# Patient Record
Sex: Female | Born: 1935 | Race: White | Hispanic: No | Smoking: Never smoker
Health system: Southern US, Community
[De-identification: ages and names within clinical notes are randomized; demographics above are authoritative.]

## PROBLEM LIST (undated history)

## (undated) DIAGNOSIS — I4891 Unspecified atrial fibrillation: Secondary | ICD-10-CM

## (undated) DIAGNOSIS — K5792 Diverticulitis of intestine, part unspecified, without perforation or abscess without bleeding: Secondary | ICD-10-CM

## (undated) DIAGNOSIS — F329 Major depressive disorder, single episode, unspecified: Secondary | ICD-10-CM

## (undated) DIAGNOSIS — K219 Gastro-esophageal reflux disease without esophagitis: Secondary | ICD-10-CM

## (undated) DIAGNOSIS — E079 Disorder of thyroid, unspecified: Secondary | ICD-10-CM

## (undated) DIAGNOSIS — F32A Depression, unspecified: Secondary | ICD-10-CM

## (undated) HISTORY — PX: JOINT REPLACEMENT: SHX530

---

## 1998-10-21 ENCOUNTER — Inpatient Hospital Stay (HOSPITAL_COMMUNITY): Admission: EM | Admit: 1998-10-21 | Discharge: 1998-10-23 | Payer: Self-pay | Admitting: Emergency Medicine

## 1998-10-21 ENCOUNTER — Encounter: Payer: Self-pay | Admitting: Cardiovascular Disease

## 1998-10-22 ENCOUNTER — Encounter: Payer: Self-pay | Admitting: Cardiology

## 1998-11-08 ENCOUNTER — Encounter: Payer: Self-pay | Admitting: Gastroenterology

## 1998-11-08 ENCOUNTER — Ambulatory Visit (HOSPITAL_COMMUNITY): Admission: RE | Admit: 1998-11-08 | Discharge: 1998-11-08 | Payer: Self-pay | Admitting: Gastroenterology

## 2000-01-25 ENCOUNTER — Emergency Department (HOSPITAL_COMMUNITY): Admission: EM | Admit: 2000-01-25 | Discharge: 2000-01-25 | Payer: Self-pay | Admitting: Emergency Medicine

## 2000-02-20 ENCOUNTER — Other Ambulatory Visit: Admission: RE | Admit: 2000-02-20 | Discharge: 2000-02-20 | Payer: Self-pay | Admitting: Gastroenterology

## 2000-02-20 ENCOUNTER — Encounter (INDEPENDENT_AMBULATORY_CARE_PROVIDER_SITE_OTHER): Payer: Self-pay | Admitting: Specialist

## 2000-10-08 ENCOUNTER — Emergency Department (HOSPITAL_COMMUNITY): Admission: EM | Admit: 2000-10-08 | Discharge: 2000-10-09 | Payer: Self-pay | Admitting: Emergency Medicine

## 2001-02-13 ENCOUNTER — Ambulatory Visit (HOSPITAL_COMMUNITY): Admission: RE | Admit: 2001-02-13 | Discharge: 2001-02-13 | Payer: Self-pay | Admitting: Gastroenterology

## 2001-02-13 ENCOUNTER — Encounter: Payer: Self-pay | Admitting: Gastroenterology

## 2009-07-21 ENCOUNTER — Encounter: Admission: RE | Admit: 2009-07-21 | Discharge: 2009-07-21 | Payer: Self-pay | Admitting: Orthopedic Surgery

## 2009-07-29 ENCOUNTER — Ambulatory Visit (HOSPITAL_BASED_OUTPATIENT_CLINIC_OR_DEPARTMENT_OTHER): Admission: RE | Admit: 2009-07-29 | Discharge: 2009-07-29 | Payer: Self-pay | Admitting: Orthopedic Surgery

## 2009-12-20 ENCOUNTER — Ambulatory Visit: Payer: Self-pay | Admitting: Radiology

## 2009-12-20 ENCOUNTER — Emergency Department (HOSPITAL_BASED_OUTPATIENT_CLINIC_OR_DEPARTMENT_OTHER): Admission: EM | Admit: 2009-12-20 | Discharge: 2009-12-20 | Payer: Self-pay | Admitting: Emergency Medicine

## 2010-08-13 LAB — DIFFERENTIAL
Eosinophils Absolute: 0.2 10*3/uL (ref 0.0–0.7)
Eosinophils Relative: 3 % (ref 0–5)
Lymphocytes Relative: 32 % (ref 12–46)
Lymphs Abs: 2.5 10*3/uL (ref 0.7–4.0)
Monocytes Absolute: 0.5 10*3/uL (ref 0.1–1.0)
Monocytes Relative: 6 % (ref 3–12)

## 2010-08-13 LAB — POCT CARDIAC MARKERS
CKMB, poc: 1.4 ng/mL (ref 1.0–8.0)
Myoglobin, poc: 66 ng/mL (ref 12–200)
Troponin i, poc: <0.05 ng/mL (ref 0.00–0.09)

## 2010-08-13 LAB — CBC
Hemoglobin: 13.1 g/dL (ref 12.0–15.0)
MCHC: 33.2 g/dL (ref 30.0–36.0)
Platelets: 195 10*3/uL (ref 150–400)

## 2010-08-13 LAB — COMPREHENSIVE METABOLIC PANEL
ALT: 23 U/L (ref 0–35)
AST: 29 U/L (ref 0–37)
Albumin: 3.9 g/dL (ref 3.5–5.2)
CO2: 26 mEq/L (ref 19–32)
Calcium: 9.5 mg/dL (ref 8.4–10.5)
GFR calc Af Amer: >60 mL/min (ref >60)
GFR calc non Af Amer: >60 mL/min (ref >60)
Sodium: 144 mEq/L (ref 135–145)
Total Protein: 7.1 g/dL (ref 6.0–8.3)

## 2010-08-26 ENCOUNTER — Encounter (HOSPITAL_COMMUNITY)
Admission: RE | Admit: 2010-08-26 | Discharge: 2010-08-26 | Disposition: A | Discharge status: Home / Self Care | Payer: Medicare Other | Source: Ambulatory Visit | Attending: Orthopedic Surgery | Admitting: Orthopedic Surgery

## 2010-08-26 ENCOUNTER — Other Ambulatory Visit: Payer: Self-pay | Admitting: Orthopedic Surgery

## 2010-08-26 ENCOUNTER — Encounter (INDEPENDENT_AMBULATORY_CARE_PROVIDER_SITE_OTHER): Payer: Medicare Other | Admitting: *Deleted

## 2010-08-26 DIAGNOSIS — M79609 Pain in unspecified limb: Secondary | ICD-10-CM

## 2010-08-26 DIAGNOSIS — R609 Edema, unspecified: Secondary | ICD-10-CM

## 2010-08-26 LAB — COMPREHENSIVE METABOLIC PANEL
ALT: 26 U/L (ref 0–35)
AST: 28 U/L (ref 0–37)
CO2: 26 mEq/L (ref 19–32)
Calcium: 9.4 mg/dL (ref 8.4–10.5)
Chloride: 105 mEq/L (ref 96–112)
GFR calc Af Amer: >60 mL/min (ref >60)
GFR calc non Af Amer: >60 mL/min (ref >60)
Glucose, Bld: 99 mg/dL (ref 70–99)
Sodium: 139 mEq/L (ref 135–145)
Total Bilirubin: 0.6 mg/dL (ref 0.3–1.2)

## 2010-08-26 LAB — URINALYSIS, ROUTINE W REFLEX MICROSCOPIC
Glucose, UA: NEGATIVE mg/dL
Hgb urine dipstick: NEGATIVE
Ketones, ur: NEGATIVE mg/dL
Protein, ur: NEGATIVE mg/dL
Urobilinogen, UA: 0.2 mg/dL (ref 0.0–1.0)

## 2010-08-26 LAB — CBC
Hemoglobin: 13.2 g/dL (ref 12.0–15.0)
Platelets: 266 10*3/uL (ref 150–400)
RBC: 4.32 MIL/uL (ref 3.87–5.11)

## 2010-08-26 LAB — SURGICAL PCR SCREEN: Staphylococcus aureus: NEGATIVE

## 2010-08-26 LAB — URINE MICROSCOPIC-ADD ON

## 2010-08-26 LAB — PROTIME-INR
INR: 0.91 (ref 0.00–1.49)
Prothrombin Time: 12.5 seconds (ref 11.6–15.2)

## 2010-08-31 ENCOUNTER — Inpatient Hospital Stay (HOSPITAL_COMMUNITY): Payer: Medicare Other

## 2010-08-31 ENCOUNTER — Inpatient Hospital Stay (HOSPITAL_COMMUNITY)
Admission: RE | Admit: 2010-08-31 | Discharge: 2010-09-03 | DRG: 470 | Disposition: A | Discharge status: Skilled Nursing Facility | Payer: Medicare Other | Source: Ambulatory Visit | Attending: Orthopedic Surgery | Admitting: Orthopedic Surgery

## 2010-08-31 DIAGNOSIS — F329 Major depressive disorder, single episode, unspecified: Secondary | ICD-10-CM | POA: Diagnosis present

## 2010-08-31 DIAGNOSIS — Z01810 Encounter for preprocedural cardiovascular examination: Secondary | ICD-10-CM

## 2010-08-31 DIAGNOSIS — M1710 Unilateral primary osteoarthritis, unspecified knee: Principal | ICD-10-CM | POA: Diagnosis present

## 2010-08-31 DIAGNOSIS — F3289 Other specified depressive episodes: Secondary | ICD-10-CM | POA: Diagnosis present

## 2010-08-31 DIAGNOSIS — Z01812 Encounter for preprocedural laboratory examination: Secondary | ICD-10-CM

## 2010-08-31 DIAGNOSIS — IMO0002 Reserved for concepts with insufficient information to code with codable children: Principal | ICD-10-CM | POA: Diagnosis present

## 2010-08-31 DIAGNOSIS — K5732 Diverticulitis of large intestine without perforation or abscess without bleeding: Secondary | ICD-10-CM | POA: Diagnosis present

## 2010-08-31 DIAGNOSIS — K219 Gastro-esophageal reflux disease without esophagitis: Secondary | ICD-10-CM | POA: Diagnosis present

## 2010-09-01 ENCOUNTER — Encounter: Payer: Self-pay | Admitting: Orthopedic Surgery

## 2010-09-01 LAB — CBC
HCT: 28.2 % — ABNORMAL LOW (ref 36.0–46.0)
Hemoglobin: 9 g/dL — ABNORMAL LOW (ref 12.0–15.0)
MCH: 29.8 pg (ref 26.0–34.0)
MCV: 93.4 fL (ref 78.0–100.0)
RBC: 3.02 MIL/uL — ABNORMAL LOW (ref 3.87–5.11)
WBC: 7.8 10*3/uL (ref 4.0–10.5)

## 2010-09-01 LAB — HEMOGLOBIN AND HEMATOCRIT, BLOOD: HCT: 28.9 % — ABNORMAL LOW (ref 36.0–46.0)

## 2010-09-01 LAB — BASIC METABOLIC PANEL
CO2: 26 mEq/L (ref 19–32)
Calcium: 7.8 mg/dL — ABNORMAL LOW (ref 8.4–10.5)
Chloride: 105 mEq/L (ref 96–112)
GFR calc Af Amer: >60 mL/min (ref >60)
Glucose, Bld: 128 mg/dL — ABNORMAL HIGH (ref 70–99)
Potassium: 3.9 mEq/L (ref 3.5–5.1)
Sodium: 137 mEq/L (ref 135–145)

## 2010-09-01 NOTE — Op Note (Signed)
NAMESALAH, BURLISON NO.:  [id number]  MEDICAL RECORD NO.:  [id number]           PATIENT TYPE:  I  LOCATION:  5017                         FACILITY:  [facility]  PHYSICIAN:  Loreta Ave, M.D. DATE OF BIRTH:  Feb 19, 1926  DATE OF PROCEDURE:  08/31/2010 DATE OF DISCHARGE:                              OPERATIVE REPORT   PREOPERATIVE DIAGNOSES: 1. End-stage degenerative arthritis, left knee, varus alignment, mild     flexion contracture. 2. Approaching end-stage degenerative arthritis, right knee.  POSTOPERATIVE DIAGNOSES: 1. End-stage degenerative arthritis, left knee, varus alignment, mild     flexion contracture. 2. Approaching end-stage degenerative arthritis, right knee.  PROCEDURES: 1. Left knee modified minimally invasive total knee replacement     Stryker triathlon prosthesis.  Soft tissue balancing.  Cemented     pegged posterior stabilized #4 femoral component.  Cemented #4     tibial component, 9-mm polyethylene insert.  Cemented resurfacing     32-mm patellar component.  Soft tissue balancing. 2. Intraarticular injection, right knee with Depo-Medrol and Marcaine.  SURGEON:  Loreta Ave, MD  ASSISTANT:  Genene Churn. Barry Dienes, [place], presented throughout the entire case and necessary for timely completion of procedure.  ANESTHESIA:  General.  BLOOD LOSS:  Minimal.  SPECIMENS:  None.  CULTURES:  None.  COMPLICATIONS:  None.  DRESSING:  Soft compressive on the left with knee immobilizer.  DRAIN:  Hemovac x1 on the left.  TOURNIQUET TIME:  One hour on the left.  PROCEDURE:  The patient was brought to the operating room and placed on the operating table in supine position.  After adequate anesthesia had been obtained, both knees were examined.  On the right, she had full extension, some varus flexion better than 90 degrees.  Under sterile technique, I injected intra-articularly with Depo-Medrol and Marcaine, 80 mg Depo-Medrol and 5 mL of  0.25% Marcaine.  Attention was turned to the left.  Knee was examined.  Mild flexion contracture, varus alignment, partially correctable flexion 90 degrees.  Tourniquet was applied.  Prepped and draped in the usual sterile fashion. Exsanguinated with elevation of Esmarch, tourniquet was deflated to 350 mmHg.  Straight incision above the patella down to tibial tubercle. Medial arthrotomy, vastus splitting, preserving quad tendon.  Knee was exposed.  Grade 4 change throughout.  Moderately osteopenic throughout as well.  Grade 4 changes throughout.  Loose bodies, remnants of menisci, cruciate ligaments, and spurs were removed.  Distal femur was exposed.  Intramedullary guide was placed.  A 10-mm resection, set at 5 degrees of valgus.  Using epicondylar axis, the femur was sized, cut, and fitted for posterior stabilized #4 femoral component.  Attention was turned to the tibia.  Extramedullary guide.  Three-degree posterior slope cut.  Size #4 component.  Medial capsule release had already been done.  Patella was exposed, posterior 10 mm was removed.  Drill was sized and fitted for a 32-mm component.  Trials were put in place throughout.  #4 above and below and a 9-mm insert.  32-mm patella.  With this construct, I had a nicely balanced knee which was stable in flexion and extension.  Good mechanical axis, full motion.  Excellent patellofemoral tracking.  Tibia was marked for rotation and hand reamed. All trials had been removed.  Copious irrigation with a pulse irrigating device.  Cement prepared, placed on all components, firmly seated. Excessive cement was removed.  Polyethylene was attached to the tibia. Knee was reduced.  Patella was held with clamp.  Once the cement hardened, knee was reexamined.  I again pleased with alignment, stability, mechanical axis, and tracking.  Wound was irrigated again. Hemovac was placed.  Brought out through a separate stab wound. Arthrotomy was closed #1  Vicryl, skin and subcutaneous tissue with Vicryl and staples.  Knee was injected with Marcaine, Hemovac clamp. Sterile compressive dressing was applied.  Tourniquet was deflated and removed.  Knee immobilizer was applied.  Anesthesia was reversed. Brought to the recovery room.  Tolerated the surgery well.  No complications.     Loreta Ave, M.D.     DFM/MEDQ  D:  08/31/2010  T:  09/01/2010  Job:  [id number]  Electronically Signed by Mckinley Jewel M.D. on 09/01/2010 02:10:11 PM

## 2010-09-02 LAB — BASIC METABOLIC PANEL WITH GFR
BUN: 6 mg/dL (ref 6–23)
CO2: 25 meq/L (ref 19–32)
Calcium: 8.1 mg/dL — ABNORMAL LOW (ref 8.4–10.5)
Chloride: 105 meq/L (ref 96–112)
Creatinine, Ser: 0.61 mg/dL (ref 0.4–1.2)
GFR calc non Af Amer: >60 mL/min
Glucose, Bld: 122 mg/dL — ABNORMAL HIGH (ref 70–99)
Potassium: 4.2 meq/L (ref 3.5–5.1)
Sodium: 136 meq/L (ref 135–145)

## 2010-09-02 LAB — URINALYSIS, ROUTINE W REFLEX MICROSCOPIC
Bilirubin Urine: NEGATIVE
Glucose, UA: NEGATIVE mg/dL
Hgb urine dipstick: NEGATIVE
Ketones, ur: NEGATIVE mg/dL
Nitrite: NEGATIVE
Protein, ur: NEGATIVE mg/dL
Specific Gravity, Urine: 1.033 — ABNORMAL HIGH (ref 1.005–1.030)
Urobilinogen, UA: 0.2 mg/dL (ref 0.0–1.0)
pH: 5 (ref 5.0–8.0)

## 2010-09-02 LAB — CBC
HCT: 26.7 % — ABNORMAL LOW (ref 36.0–46.0)
Hemoglobin: 8.5 g/dL — ABNORMAL LOW (ref 12.0–15.0)
RBC: 2.85 MIL/uL — ABNORMAL LOW (ref 3.87–5.11)
RDW: 14 % (ref 11.5–15.5)
WBC: 9.4 10*3/uL (ref 4.0–10.5)

## 2010-09-02 LAB — URINE CULTURE: Culture  Setup Time: 201204040713

## 2010-09-02 LAB — PROTIME-INR: INR: 1.39 (ref 0.00–1.49)

## 2010-09-03 LAB — BASIC METABOLIC PANEL
BUN: 5 mg/dL — ABNORMAL LOW (ref 6–23)
Calcium: 8.4 mg/dL (ref 8.4–10.5)
Creatinine, Ser: 0.54 mg/dL (ref 0.4–1.2)
GFR calc non Af Amer: >60 mL/min (ref >60)
Potassium: 3.9 mEq/L (ref 3.5–5.1)

## 2010-09-03 LAB — PROTIME-INR
INR: 2.01 — ABNORMAL HIGH (ref 0.00–1.49)
Prothrombin Time: 22.9 seconds — ABNORMAL HIGH (ref 11.6–15.2)

## 2010-09-03 LAB — CBC
Hemoglobin: 8 g/dL — ABNORMAL LOW (ref 12.0–15.0)
MCHC: 32.4 g/dL (ref 30.0–36.0)
Platelets: 185 10*3/uL (ref 150–400)
RDW: 13.8 % (ref 11.5–15.5)

## 2010-09-16 ENCOUNTER — Encounter (INDEPENDENT_AMBULATORY_CARE_PROVIDER_SITE_OTHER): Payer: Medicare Other | Admitting: *Deleted

## 2010-09-16 ENCOUNTER — Other Ambulatory Visit: Payer: Self-pay | Admitting: Cardiology

## 2010-09-16 DIAGNOSIS — M7989 Other specified soft tissue disorders: Secondary | ICD-10-CM

## 2010-09-16 DIAGNOSIS — M79609 Pain in unspecified limb: Secondary | ICD-10-CM

## 2010-09-19 ENCOUNTER — Encounter: Payer: Self-pay | Admitting: Orthopedic Surgery

## 2010-10-04 NOTE — Discharge Summary (Signed)
[name], Fernandez NO.:  [id number]  MEDICAL RECORD NO.:  [id number]           PATIENT TYPE:  I  LOCATION:  5017                         FACILITY:  [facility]  PHYSICIAN:  Vanessa Fernandez, M.D. DATE OF BIRTH:  01/21/1926  DATE OF ADMISSION:  08/31/2010 DATE OF DISCHARGE:  09/03/2010                              DISCHARGE SUMMARY   FINAL DIAGNOSES: 1. Status post left total knee replacement for end-stage degenerative     joint disease. 2. Right knee intra-articular Marcaine/Depo-Medrol injection for     degenerative joint disease. 3. Gastroesophageal reflux disease. 4. Depression. 5. Diverticulitis.  HISTORY OF PRESENT ILLNESS:  This is an 77 year old white female with history of end-stage DJD, left knee and chronic pain who presented to our office for preop evaluation for total knee replacement.  She had progressively worsening pain with no response with conservative treatment.  Significant decrease in her daily activities due to the ongoing complaint.  The patient also had a known history of right knee DJD and was wanting intra-articular Marcaine/Depo-Medrol injection performed at the time of her surgery.  HOSPITAL COURSE:  On August 31, 2010, the patient was taken to the [facility] OR and a left total knee replacement and right knee intra-articular Marcaine/Depo-Medrol injection procedures were performed.  Surgeon was Mckinley Jewel, MD and assistant was Zonia Kief, PA-C.  Anesthesia was general.  Minimal blood loss.  Tourniquet time, left knee 58 minutes. One Hemovac drain was placed.  There were no surgical or anesthesia complications and the patient was transferred to recovery in stable condition.  September 01, 2010, the patient doing well with good pain control, left knee.  A couple hours ago, the patient was complaining of some lower chest pressure or aching.  RN gave her Maalox and the patient did have some relief with this.  We ordered a EKG which  showed a normal sinus rhythm.  No acute changes.  Temperature 100.2, pulse 74, respirations 18, and blood pressure 103/57.  Dressing clean, dry, intact.  Calf nontender.  Neurovascularly intact.  Skin warm and dry. Case manager to began arranging transfer to [facility] for rehab. The patient states that she does have a bed available.  PT, OT consults. Pharmacy protocol Coumadin and Lovenox are for DVT prophylaxis.  LABORATORY DATA:  WBC 7.8, hemoglobin 9.0, hematocrit 28.2, and platelets 202.  Sodium 137, potassium 3.9, chloride 105, CO2 26, BUN 6, creatinine 0.60, and glucose 128.  INR 1.11.  DISCHARGE MEDICATIONS: 1. Norco 10/325 one to two tablets p.o. q.6-8 h. for pain. 2. Robaxin 500 mg 1 tablet p.o. q.6 h. p.r.n. for spasms. 3. Lovenox 30 mg 1 subcu injection q.12 h. and discontinue when     Coumadin is therapeutic with INR 2-3. 4. Coumadin pharmacy protocol.  Maintain INR 2-3 x4 weeks postop for     DVT prophylaxis. 5. Florastor 1 capsule p.o. daily. 6. Carafate CTs 2 teaspoons p.o. daily as needed. 7. Latanoprost 0.005% ophthalmic 1 drop in both eyes daily at bedtime. 8. Clorazepate 7.5 mg 1 tablet p.o. at bedtime. 9. Sertraline 50 mg 1 tablet p.o. daily. 10.Nexium 40 mg 1  tablet p.o. at bedtime.  CONDITION:  Good and stable.  DISPOSITION:  Transfer to a skilled nursing facility for rehab.  INSTRUCTIONS:  While at the facility, the patient will continue to work with PT and OT to improve ambulation and knee range of motion and strengthening.  Weightbear as tolerated.  CPM 0-70 degrees 6-8 hours daily and increase by 10 degrees each day.  Coumadin x4 weeks postop DVT prophylaxis.  Stop Lovenox when Coumadin is therapeutic with INR 2-3. Daily dressing changes with a 4 x 4 gauze and then apply [facility] over this.  Continue to ice and elevate as needed.  The patient needs return office visit with Dr. Eulah Pont when she is 2 weeks postop for recheck.  We will remove staples at  that time.  Please notify us immediately if there are any other questions or concerns at [phone].     Vanessa Fernandez. Vanessa Fernandez.   ______________________________ Vanessa Fernandez, M.D.    JMO/MEDQ  D:  09/01/2010  T:  09/02/2010  Job:  [id number]  Electronically Signed by Zonia Kief P.A. on 09/14/2010 03:21:50 PM Electronically Signed by Mckinley Jewel M.D. on 10/04/2010 01:29:33 PM

## 2011-03-21 ENCOUNTER — Encounter (HOSPITAL_COMMUNITY)
Admission: RE | Admit: 2011-03-21 | Discharge: 2011-03-21 | Disposition: A | Discharge status: Home / Self Care | Payer: Medicare Other | Source: Ambulatory Visit | Attending: Orthopedic Surgery | Admitting: Orthopedic Surgery

## 2011-03-21 LAB — COMPREHENSIVE METABOLIC PANEL
ALT: 12 U/L (ref 0–35)
AST: 18 U/L (ref 0–37)
Albumin: 3.8 g/dL (ref 3.5–5.2)
CO2: 28 mEq/L (ref 19–32)
Calcium: 9.9 mg/dL (ref 8.4–10.5)
Chloride: 104 mEq/L (ref 96–112)
GFR calc non Af Amer: 79 mL/min — ABNORMAL LOW (ref >90)
Sodium: 141 mEq/L (ref 135–145)

## 2011-03-21 LAB — CBC
Platelets: 261 10*3/uL (ref 150–400)
RDW: 13.4 % (ref 11.5–15.5)
WBC: 6.9 10*3/uL (ref 4.0–10.5)

## 2011-03-21 LAB — URINE MICROSCOPIC-ADD ON

## 2011-03-21 LAB — TYPE AND SCREEN

## 2011-03-21 LAB — URINALYSIS, ROUTINE W REFLEX MICROSCOPIC
Bilirubin Urine: NEGATIVE
Ketones, ur: NEGATIVE mg/dL
Nitrite: NEGATIVE
Urobilinogen, UA: 0.2 mg/dL (ref 0.0–1.0)

## 2011-03-21 LAB — SURGICAL PCR SCREEN: Staphylococcus aureus: NEGATIVE

## 2011-03-21 LAB — APTT: aPTT: 29 seconds (ref 24–37)

## 2011-03-21 LAB — PROTIME-INR: INR: 0.98 (ref 0.00–1.49)

## 2011-03-29 ENCOUNTER — Ambulatory Visit (HOSPITAL_COMMUNITY)
Admission: RE | Admit: 2011-03-29 | Discharge: 2011-03-29 | Disposition: A | Discharge status: Home / Self Care | Payer: Medicare Other | Source: Ambulatory Visit | Attending: Orthopedic Surgery | Admitting: Orthopedic Surgery

## 2011-03-29 ENCOUNTER — Other Ambulatory Visit (HOSPITAL_COMMUNITY): Payer: Self-pay | Admitting: Orthopedic Surgery

## 2011-03-29 ENCOUNTER — Inpatient Hospital Stay (HOSPITAL_COMMUNITY): Payer: Medicare Other

## 2011-03-29 ENCOUNTER — Inpatient Hospital Stay (HOSPITAL_COMMUNITY)
Admission: RE | Admit: 2011-03-29 | Discharge: 2011-04-01 | DRG: 470 | Disposition: A | Discharge status: Skilled Nursing Facility | Payer: Medicare Other | Source: Ambulatory Visit | Attending: Orthopedic Surgery | Admitting: Orthopedic Surgery

## 2011-03-29 DIAGNOSIS — F329 Major depressive disorder, single episode, unspecified: Secondary | ICD-10-CM | POA: Diagnosis present

## 2011-03-29 DIAGNOSIS — K219 Gastro-esophageal reflux disease without esophagitis: Secondary | ICD-10-CM | POA: Diagnosis present

## 2011-03-29 DIAGNOSIS — K5732 Diverticulitis of large intestine without perforation or abscess without bleeding: Secondary | ICD-10-CM | POA: Diagnosis present

## 2011-03-29 DIAGNOSIS — M1710 Unilateral primary osteoarthritis, unspecified knee: Principal | ICD-10-CM | POA: Diagnosis present

## 2011-03-29 DIAGNOSIS — IMO0002 Reserved for concepts with insufficient information to code with codable children: Principal | ICD-10-CM | POA: Diagnosis present

## 2011-03-29 DIAGNOSIS — M1990 Unspecified osteoarthritis, unspecified site: Secondary | ICD-10-CM

## 2011-03-29 DIAGNOSIS — F3289 Other specified depressive episodes: Secondary | ICD-10-CM | POA: Diagnosis present

## 2011-03-29 DIAGNOSIS — Z87891 Personal history of nicotine dependence: Secondary | ICD-10-CM

## 2011-03-29 LAB — URINALYSIS, ROUTINE W REFLEX MICROSCOPIC
Glucose, UA: NEGATIVE mg/dL
Ketones, ur: NEGATIVE mg/dL
Leukocytes, UA: NEGATIVE
Nitrite: NEGATIVE
Protein, ur: NEGATIVE mg/dL
Urobilinogen, UA: 0.2 mg/dL (ref 0.0–1.0)

## 2011-03-30 LAB — BASIC METABOLIC PANEL
BUN: 8 mg/dL (ref 6–23)
CO2: 24 mEq/L (ref 19–32)
Chloride: 104 mEq/L (ref 96–112)
GFR calc Af Amer: >90 mL/min (ref >90)
Glucose, Bld: 114 mg/dL — ABNORMAL HIGH (ref 70–99)
Potassium: 4.1 mEq/L (ref 3.5–5.1)

## 2011-03-30 LAB — URINE CULTURE
Colony Count: NO GROWTH
Culture  Setup Time: 201210310743

## 2011-03-30 LAB — CBC
HCT: 33.1 % — ABNORMAL LOW (ref 36.0–46.0)
MCHC: 31.7 g/dL (ref 30.0–36.0)
Platelets: 180 10*3/uL (ref 150–400)
RDW: 14.1 % (ref 11.5–15.5)
WBC: 7.5 10*3/uL (ref 4.0–10.5)

## 2011-03-31 LAB — BASIC METABOLIC PANEL
CO2: 28 mEq/L (ref 19–32)
Calcium: 8.5 mg/dL (ref 8.4–10.5)
GFR calc non Af Amer: 82 mL/min — ABNORMAL LOW (ref >90)
Glucose, Bld: 126 mg/dL — ABNORMAL HIGH (ref 70–99)
Potassium: 3.8 mEq/L (ref 3.5–5.1)
Sodium: 138 mEq/L (ref 135–145)

## 2011-03-31 LAB — CBC
MCV: 92.9 fL (ref 78.0–100.0)
Platelets: 185 10*3/uL (ref 150–400)
RDW: 13.9 % (ref 11.5–15.5)
WBC: 8.1 10*3/uL (ref 4.0–10.5)

## 2011-03-31 LAB — GLUCOSE, CAPILLARY: Glucose-Capillary: 122 mg/dL — ABNORMAL HIGH (ref 70–99)

## 2011-03-31 MED ORDER — ACETAMINOPHEN 650 MG RE SUPP: 650.0000 mg | Freq: Four times a day (QID) | RECTAL | Status: DC | PRN

## 2011-04-01 LAB — BASIC METABOLIC PANEL
BUN: 7 mg/dL (ref 6–23)
GFR calc Af Amer: >90 mL/min (ref >90)
GFR calc non Af Amer: 81 mL/min — ABNORMAL LOW (ref >90)
Potassium: 3.6 mEq/L (ref 3.5–5.1)
Sodium: 139 mEq/L (ref 135–145)

## 2011-04-01 LAB — CBC
Platelets: 182 10*3/uL (ref 150–400)
RBC: 2.82 MIL/uL — ABNORMAL LOW (ref 3.87–5.11)
WBC: 5.2 10*3/uL (ref 4.0–10.5)

## 2011-04-01 LAB — PROTIME-INR
INR: 2.56 — ABNORMAL HIGH (ref 0.00–1.49)
Prothrombin Time: 27.9 seconds — ABNORMAL HIGH (ref 11.6–15.2)

## 2011-04-01 MED ORDER — METOCLOPRAMIDE HCL 10 MG PO TABS: 5.0000 mg | ORAL_TABLET | Freq: Three times a day (TID) | ORAL | Status: DC | PRN

## 2011-04-01 MED ORDER — BISACODYL 10 MG RE SUPP
10.0000 mg | Freq: Every day | RECTAL | Status: DC | PRN
Start: 2011-04-01 — End: 2011-04-01

## 2011-04-01 MED ORDER — ENOXAPARIN SODIUM 30 MG/0.3ML ~~LOC~~ SOLN
30.0000 mg | Freq: Two times a day (BID) | SUBCUTANEOUS | Status: DC
  Filled 2011-04-01 (×3): qty 0.3

## 2011-04-01 MED ORDER — ONDANSETRON HCL 4 MG/2ML IJ SOLN: 4.0000 mg | Freq: Four times a day (QID) | INTRAMUSCULAR | Status: DC | PRN

## 2011-04-01 MED ORDER — HYDROCODONE-ACETAMINOPHEN 5-325 MG PO TABS: 1.0000 | ORAL_TABLET | Freq: Four times a day (QID) | ORAL | Status: DC | PRN

## 2011-04-01 MED ORDER — PHENOL 1.4 % MT LIQD: 1.0000 | OROMUCOSAL | Status: DC | PRN

## 2011-04-01 MED ORDER — ONDANSETRON HCL 4 MG PO TABS: 4.0000 mg | ORAL_TABLET | Freq: Four times a day (QID) | ORAL | Status: DC | PRN

## 2011-04-01 MED ORDER — MENTHOL 3 MG MT LOZG: 1.0000 | LOZENGE | OROMUCOSAL | Status: DC | PRN

## 2011-04-01 MED ORDER — ALUM & MAG HYDROXIDE-SIMETH 200-200-20 MG/5ML PO SUSP: 30.0000 mL | ORAL | Status: DC | PRN

## 2011-04-01 MED ORDER — ACETAMINOPHEN 650 MG RE SUPP: 325.0000 mg | RECTAL | Status: DC | PRN

## 2011-04-01 MED ORDER — METHOCARBAMOL 100 MG/ML IJ SOLN
500.0000 mg | Freq: Four times a day (QID) | INTRAVENOUS | Status: DC | PRN
  Filled 2011-04-01: qty 5

## 2011-04-01 MED ORDER — SUCRALFATE 1 GM/10ML PO SUSP
1.0000 g | Freq: Every day | ORAL | Status: DC | PRN
  Filled 2011-04-01: qty 10

## 2011-04-01 MED ORDER — DOCUSATE SODIUM 100 MG PO CAPS: 100.0000 mg | ORAL_CAPSULE | Freq: Two times a day (BID) | ORAL | Status: DC

## 2011-04-01 MED ORDER — ALUMINUM HYDROXIDE GEL 320 MG/5ML PO SUSP
30.0000 mL | Freq: Four times a day (QID) | ORAL | Status: DC | PRN
  Filled 2011-04-01: qty 30

## 2011-04-01 MED ORDER — METOCLOPRAMIDE HCL 5 MG/ML IJ SOLN
5.0000 mg | Freq: Three times a day (TID) | INTRAMUSCULAR | Status: DC | PRN
  Filled 2011-04-01: qty 2

## 2011-04-01 MED ORDER — SACCHAROMYCES BOULARDII 250 MG PO CAPS
250.0000 mg | ORAL_CAPSULE | Freq: Every day | ORAL | Status: DC
  Filled 2011-04-01 (×2): qty 1

## 2011-04-01 MED ORDER — BISACODYL 5 MG PO TBEC: 10.0000 mg | DELAYED_RELEASE_TABLET | Freq: Every day | ORAL | Status: DC | PRN

## 2011-04-01 MED ORDER — SERTRALINE HCL 50 MG PO TABS
50.0000 mg | ORAL_TABLET | Freq: Every day | ORAL | Status: DC
  Filled 2011-04-01 (×2): qty 1

## 2011-04-01 MED ORDER — SENNOSIDES-DOCUSATE SODIUM 8.6-50 MG PO TABS: 1.0000 | ORAL_TABLET | Freq: Every evening | ORAL | Status: DC | PRN

## 2011-04-01 MED ORDER — CLORAZEPATE DIPOTASSIUM 7.5 MG PO TABS: 7.5000 mg | ORAL_TABLET | Freq: Every day | ORAL | Status: DC

## 2011-04-01 MED ORDER — DIPHENHYDRAMINE HCL 25 MG PO CAPS: 25.0000 mg | ORAL_CAPSULE | ORAL | Status: DC | PRN

## 2011-04-01 MED ORDER — FLORA-Q PO CAPS
1.0000 | ORAL_CAPSULE | Freq: Every day | ORAL | Status: DC
  Filled 2011-04-01 (×2): qty 1

## 2011-04-01 MED ORDER — ACETAMINOPHEN 325 MG PO TABS: 325.0000 mg | ORAL_TABLET | ORAL | Status: DC | PRN

## 2011-04-01 MED ORDER — PANTOPRAZOLE SODIUM 40 MG PO TBEC: 80.0000 mg | DELAYED_RELEASE_TABLET | Freq: Every day | ORAL | Status: DC

## 2011-04-01 MED ORDER — LATANOPROST 0.005 % OP SOLN
1.0000 [drp] | Freq: Every day | OPHTHALMIC | Status: DC
  Filled 2011-04-01: qty 2.5

## 2011-04-01 MED ORDER — METHOCARBAMOL 500 MG PO TABS: 500.0000 mg | ORAL_TABLET | Freq: Four times a day (QID) | ORAL | Status: DC | PRN

## 2011-04-01 MED ORDER — SODIUM CHLORIDE 0.9 % IV SOLN
INTRAVENOUS | Status: DC
  Filled 2011-04-01 (×3): qty 1000

## 2011-04-01 MED ORDER — FLEET ENEMA 7-19 GM/118ML RE ENEM: 1.0000 | ENEMA | Freq: Every day | RECTAL | Status: DC | PRN

## 2011-04-04 ENCOUNTER — Emergency Department (HOSPITAL_COMMUNITY)
Admission: EM | Admit: 2011-04-04 | Discharge: 2011-04-04 | Disposition: A | Discharge status: Rehabilitation Facility | Payer: Medicare Other | Attending: Emergency Medicine | Admitting: Emergency Medicine

## 2011-04-04 ENCOUNTER — Emergency Department (HOSPITAL_COMMUNITY): Payer: Medicare Other

## 2011-04-04 ENCOUNTER — Encounter: Payer: Self-pay | Admitting: Emergency Medicine

## 2011-04-04 DIAGNOSIS — K5792 Diverticulitis of intestine, part unspecified, without perforation or abscess without bleeding: Secondary | ICD-10-CM | POA: Insufficient documentation

## 2011-04-04 DIAGNOSIS — M79609 Pain in unspecified limb: Secondary | ICD-10-CM | POA: Insufficient documentation

## 2011-04-04 DIAGNOSIS — K219 Gastro-esophageal reflux disease without esophagitis: Secondary | ICD-10-CM | POA: Insufficient documentation

## 2011-04-04 DIAGNOSIS — R059 Cough, unspecified: Secondary | ICD-10-CM | POA: Insufficient documentation

## 2011-04-04 DIAGNOSIS — R509 Fever, unspecified: Secondary | ICD-10-CM

## 2011-04-04 DIAGNOSIS — R05 Cough: Secondary | ICD-10-CM | POA: Insufficient documentation

## 2011-04-04 DIAGNOSIS — Z79899 Other long term (current) drug therapy: Secondary | ICD-10-CM | POA: Insufficient documentation

## 2011-04-04 DIAGNOSIS — H409 Unspecified glaucoma: Secondary | ICD-10-CM | POA: Insufficient documentation

## 2011-04-04 DIAGNOSIS — F3289 Other specified depressive episodes: Secondary | ICD-10-CM | POA: Insufficient documentation

## 2011-04-04 DIAGNOSIS — F329 Major depressive disorder, single episode, unspecified: Secondary | ICD-10-CM | POA: Insufficient documentation

## 2011-04-04 HISTORY — DX: Gastro-esophageal reflux disease without esophagitis: K21.9

## 2011-04-04 HISTORY — DX: Diverticulitis of intestine, part unspecified, without perforation or abscess without bleeding: K57.92

## 2011-04-04 HISTORY — DX: Depression, unspecified: F32.A

## 2011-04-04 HISTORY — DX: Major depressive disorder, single episode, unspecified: F32.9

## 2011-04-04 LAB — CBC
MCH: 28.6 pg (ref 26.0–34.0)
MCHC: 31.8 g/dL (ref 30.0–36.0)
MCV: 89.9 fL (ref 78.0–100.0)
Platelets: 323 10*3/uL (ref 150–400)

## 2011-04-04 LAB — URINALYSIS, ROUTINE W REFLEX MICROSCOPIC
Bilirubin Urine: NEGATIVE
Nitrite: NEGATIVE
Protein, ur: NEGATIVE mg/dL
Specific Gravity, Urine: 1.021 (ref 1.005–1.030)
Urobilinogen, UA: 0.2 mg/dL (ref 0.0–1.0)

## 2011-04-04 LAB — URINE MICROSCOPIC-ADD ON

## 2011-04-04 LAB — DIFFERENTIAL
Basophils Relative: 0 % (ref 0–1)
Eosinophils Absolute: 0.2 10*3/uL (ref 0.0–0.7)
Eosinophils Relative: 3 % (ref 0–5)
Neutrophils Relative %: 59 % (ref 43–77)

## 2011-04-04 LAB — BASIC METABOLIC PANEL
BUN: 9 mg/dL (ref 6–23)
Calcium: 9.2 mg/dL (ref 8.4–10.5)
GFR calc Af Amer: >90 mL/min (ref >90)
GFR calc non Af Amer: 84 mL/min — ABNORMAL LOW (ref >90)
Glucose, Bld: 106 mg/dL — ABNORMAL HIGH (ref 70–99)
Potassium: 3.5 mEq/L (ref 3.5–5.1)
Sodium: 139 mEq/L (ref 135–145)

## 2011-04-04 LAB — SEDIMENTATION RATE: Sed Rate: 70 mm/hr — ABNORMAL HIGH (ref 0–22)

## 2011-04-04 MED ORDER — HYDROCODONE-ACETAMINOPHEN 5-325 MG PO TABS
2.0000 | ORAL_TABLET | Freq: Once | ORAL | Status: AC
  Administered 2011-04-04: 2 via ORAL
  Filled 2011-04-04: qty 2

## 2011-04-04 NOTE — ED Notes (Signed)
Pt here s/p Total knee replacement last wenesday with c/o new onset fever and increased pain in knee.  Pt alert and oriented x4 and in NAD.  Pt rates pain 10/10.

## 2011-04-04 NOTE — ED Provider Notes (Addendum)
History     CSN: [id number] Arrival date & time: 04/04/2011  3:34 PM   First MD Initiated Contact with Patient 04/04/11 1535      Chief Complaint  Patient presents with  . Fever    (Consider location/radiation/quality/duration/timing/severity/associated sxs/prior treatment) HPI  Past Medical History  Diagnosis Date  . Depression   . Glaucoma   . Diverticulitis   . GERD (gastroesophageal reflux disease)     Past Surgical History  Procedure Date  . Joint replacement     History reviewed. No pertinent family history.  History  Substance Use Topics  . Smoking status: Never Smoker   . Smokeless tobacco: Not on file  . Alcohol Use: No    OB History    Grav Para Term Preterm Abortions TAB SAB Ect Mult Living                  Review of Systems  Allergies  Aspirin; Bextra; Celebrex; Codeine; Ibuprofen; Naproxen; and Sulfa antibiotics  Home Medications   Current Outpatient Rx  Name Route Sig Dispense Refill  . ACETAMINOPHEN 500 MG PO TABS Oral Take 1,000 mg by mouth every 6 (six) hours as needed. For pain     . CLORAZEPATE DIPOTASSIUM 7.5 MG PO TABS Oral Take 7.5 mg by mouth at bedtime as needed. For sleep    . CROMOLYN SODIUM 5.2 MG/ACT NA AERS Nasal Place 1 spray into the nose daily as needed. For congestion     . ESOMEPRAZOLE MAGNESIUM 40 MG PO CPDR Oral Take 40 mg by mouth every evening.      Marland Kitchen LATANOPROST 0.005 % OP SOLN Both Eyes Place 1 drop into both eyes at bedtime.      Carma Leaven M PLUS PO TABS Oral Take 1 tablet by mouth daily.      Marland Kitchen ALIGN PO Oral Take 1 tablet by mouth daily.      Marland Kitchen SACCHAROMYCES BOULARDII 250 MG PO CAPS Oral Take 250 mg by mouth daily.      . SERTRALINE HCL 50 MG PO TABS Oral Take 50 mg by mouth every morning.     . SUCRALFATE 1 GM/10ML PO SUSP Oral Take 1 g by mouth 3 (three) times daily before meals. As needed For acid reflux       BP 121/59  Pulse 73  Temp(Src) 99.8 F (37.7 C) (Oral)  SpO2 93%  Physical Exam  ED Course    Procedures (including critical care time)  Labs Reviewed  CBC - Abnormal; Notable for the following:    RBC 3.18 (*)    Hemoglobin 9.1 (*)    HCT 28.6 (*)    All other components within normal limits  BASIC METABOLIC PANEL - Abnormal; Notable for the following:    Glucose, Bld 106 (*)    GFR calc non Af Amer 84 (*)    All other components within normal limits  URINALYSIS, ROUTINE W REFLEX MICROSCOPIC - Abnormal; Notable for the following:    Hgb urine dipstick MODERATE (*)    All other components within normal limits  SEDIMENTATION RATE - Abnormal; Notable for the following:    Sed Rate 70 (*)    All other components within normal limits  URINE MICROSCOPIC-ADD ON - Abnormal; Notable for the following:    Squamous Epithelial / LPF FEW (*)    All other components within normal limits  DIFFERENTIAL  CULTURE, BLOOD (ROUTINE X 2)  CULTURE, BLOOD (ROUTINE X 2)  URINE CULTURE  C-REACTIVE  PROTEIN   Dg Chest 2 View  04/04/2011  *RADIOLOGY REPORT*  Clinical Data: Cough, fever, recent knee surgery  CHEST - 2 VIEW  Comparison: 03/29/2011  Findings: Chronic interstitial markings.  No focal consolidation. No pleural effusion or pneumothorax.  Cardiomediastinal silhouette is within normal limits.  Mild degenerative changes of the visualized thoracolumbar spine.  IMPRESSION: No evidence of acute cardiopulmonary disease.  Chronic interstitial markings.  Original Report Authenticated By: Charline Bills, M.D.     No diagnosis found.    MDM  I saw and evaluated the patient, reviewed the resident's note and I agree with the findings and plan.  The patient has had fevers for two days, but no other complaints.  She had knee surgery, but the incision appears clean without erythema or drainage and no other source for infection was identified.  The workup was unremarkable, will discharge and give time.  To return for new symptoms.          Geoffery Lyons, MD 04/04/11 1610  Geoffery Lyons,  MD 04/05/11 0130  Geoffery Lyons, MD 04/06/11 [id number]

## 2011-04-04 NOTE — Progress Notes (Signed)
*  PRELIMINARY RESULTS*  Right  lower estremity venous duples has been performed.  No evidence of deep or superficial vein thrombosis noted in the right leg. [name] 04/04/2011, 5:08 PM

## 2011-04-04 NOTE — ED Notes (Signed)
[facility] to be called for transportation.

## 2011-04-04 NOTE — ED Provider Notes (Signed)
History     CSN: [id number] Arrival date & time: 04/04/2011  3:34 PM   First MD Initiated Contact with Patient 04/04/11 1535      Chief Complaint  Patient presents with  . Fever    (Consider location/radiation/quality/duration/timing/severity/associated sxs/prior treatment) Patient is a 77 y.o. female presenting with fever. The history is provided by the patient.  Fever Primary symptoms of the febrile illness include fever and cough. Primary symptoms do not include headaches, shortness of breath, abdominal pain, nausea, vomiting, diarrhea, dysuria, altered mental status or rash. The current episode started 3 to 5 days ago. This is a recurrent problem. The problem has not changed since onset. The fever began 3 to 5 days ago. The fever has been unchanged since its onset. The maximum temperature recorded prior to her arrival was 102 to 102.9 F.  The cough began 3 to 5 days ago. The cough is new. The cough is non-productive.  The onset of the illness is associated with recent antibiotic use and a foreign body (Pt was on antibiotic just before surgery for UTI; had knee replacment done just under a week ago). Risk factors for febrile illness include implanted device (pt had knee replacement done about a week ago and has been less mobile since that time).   Past Medical History  Diagnosis Date  . Depression   . Glaucoma   . Diverticulitis   . GERD (gastroesophageal reflux disease)     Past Surgical History  Procedure Date  . Joint replacement     History reviewed. No pertinent family history.  History  Substance Use Topics  . Smoking status: Never Smoker   . Smokeless tobacco: Not on file  . Alcohol Use: No    OB History    Grav Para Term Preterm Abortions TAB SAB Ect Mult Living                  Review of Systems  Constitutional: Positive for fever.  Respiratory: Positive for cough. Negative for shortness of breath.   Cardiovascular: Negative for chest pain.    Gastrointestinal: Negative for nausea, vomiting, abdominal pain and diarrhea.  Genitourinary: Negative for dysuria.  Skin: Negative for rash.  Neurological: Negative for headaches.  Psychiatric/Behavioral: Negative for altered mental status.  All other systems reviewed and are negative.    Allergies  Aspirin; Bextra; Celebrex; Codeine; Ibuprofen; Naproxen; and Sulfa antibiotics  Home Medications   Current Outpatient Rx  Name Route Sig Dispense Refill  . ACETAMINOPHEN 500 MG PO TABS Oral Take 1,000 mg by mouth every 6 (six) hours as needed. For pain     . CLORAZEPATE DIPOTASSIUM 7.5 MG PO TABS Oral Take 7.5 mg by mouth at bedtime as needed. For sleep    . CROMOLYN SODIUM 5.2 MG/ACT NA AERS Nasal Place 1 spray into the nose daily as needed. For congestion     . ESOMEPRAZOLE MAGNESIUM 40 MG PO CPDR Oral Take 40 mg by mouth every evening.      Marland Kitchen LATANOPROST 0.005 % OP SOLN Both Eyes Place 1 drop into both eyes at bedtime.      Carma Leaven M PLUS PO TABS Oral Take 1 tablet by mouth daily.      Marland Kitchen ALIGN PO Oral Take 1 tablet by mouth daily.      Marland Kitchen SACCHAROMYCES BOULARDII 250 MG PO CAPS Oral Take 250 mg by mouth daily.      . SERTRALINE HCL 50 MG PO TABS Oral Take 50 mg by  mouth every morning.     . SUCRALFATE 1 GM/10ML PO SUSP Oral Take 1 g by mouth 3 (three) times daily before meals. As needed For acid reflux       BP 138/67  Pulse 78  Temp(Src) 97.9 F (36.6 C) (Oral)  Resp 18  SpO2 96%  Physical Exam  Nursing note and vitals reviewed. Constitutional: She is oriented to person, place, and time. She appears well-developed and well-nourished. No distress.  HENT:  Head: Normocephalic and atraumatic.  Eyes: EOM are normal. Pupils are equal, round, and reactive to light.  Neck: Normal range of motion.  Cardiovascular: Normal rate and normal heart sounds.   Pulmonary/Chest: Effort normal and breath sounds normal. No respiratory distress.  Abdominal: Soft. She exhibits no distension.  There is no tenderness.  Musculoskeletal: She exhibits tenderness (right leg).  Neurological: She is alert and oriented to person, place, and time.  Skin: Skin is warm and dry.       ED Course  Procedures (including critical care time)  Labs Reviewed  CBC - Abnormal; Notable for the following:    RBC 3.18 (*)    Hemoglobin 9.1 (*)    HCT 28.6 (*)    All other components within normal limits  BASIC METABOLIC PANEL - Abnormal; Notable for the following:    Glucose, Bld 106 (*)    GFR calc non Af Amer 84 (*)    All other components within normal limits  URINALYSIS, ROUTINE W REFLEX MICROSCOPIC - Abnormal; Notable for the following:    Hgb urine dipstick MODERATE (*)    All other components within normal limits  SEDIMENTATION RATE - Abnormal; Notable for the following:    Sed Rate 70 (*)    All other components within normal limits  C-REACTIVE PROTEIN - Abnormal; Notable for the following:    CRP 11.84 (*)    All other components within normal limits  URINE MICROSCOPIC-ADD ON - Abnormal; Notable for the following:    Squamous Epithelial / LPF FEW (*)    All other components within normal limits  DIFFERENTIAL  CULTURE, BLOOD (ROUTINE X 2)  CULTURE, BLOOD (ROUTINE X 2)  URINE CULTURE   Dg Chest 2 View  04/04/2011  *RADIOLOGY REPORT*  Clinical Data: Cough, fever, recent knee surgery  CHEST - 2 VIEW  Comparison: 03/29/2011  Findings: Chronic interstitial markings.  No focal consolidation. No pleural effusion or pneumothorax.  Cardiomediastinal silhouette is within normal limits.  Mild degenerative changes of the visualized thoracolumbar spine.  IMPRESSION: No evidence of acute cardiopulmonary disease.  Chronic interstitial markings.  Original Report Authenticated By: Charline Bills, M.D.     1. Fever       MDM  Pt seen and examined. Concern for post-op fever after knee replacement; will look for respiratory cause, urine infection, and DVT.   Pt was at ultrasound when lab  came to collect blood. Now pending.   Pt without clear source for fever. No concern about knee infection at this time as incision looks to be healing well. Elevation in CRP/ESR likely secondary to recent surgery. No DVT on ultrasound. No urine infection. No pneumonia. Will advise pt to contact ortho doc in AM to discuss ongoing fever. Pt to return to ED for any worsening of fever or any redness/warmth to right knee. Will advise continued symptomatic care at facility.  Daleen Bo Resident 04/05/11 [phone]

## 2011-04-04 NOTE — ED Notes (Signed)
Pt given  Water; RN with MD to explain plan of care; questions answer.

## 2011-04-04 NOTE — ED Notes (Signed)
Per EMS:  Pt here with c/o fever that started at 0800 this am.  Pt here from [facility].  Pt also c/o knee pain.  Facility reports fever of 101.1.  Pt admitted to [facility] on Saturday after being discharged from here s/p total right knee replacement.  Pt was found feverish this am.  Pt recvd 2- 7 mg/325mg  Vicodin at 1400 today via facility.  Pt recvd 1 gram of tylenol at that time as well.

## 2011-04-04 NOTE — ED Notes (Signed)
[facility] called to let them know she will be returning and review d/c instructions.

## 2011-04-04 NOTE — ED Notes (Signed)
[facility] here at this time.  

## 2011-04-04 NOTE — ED Notes (Signed)
Vital signs stable. 

## 2011-04-05 LAB — C-REACTIVE PROTEIN: CRP: 11.84 mg/dL — ABNORMAL HIGH (ref <0.60)

## 2011-04-05 LAB — URINE CULTURE

## 2011-04-05 NOTE — Discharge Summary (Signed)
NAMESILVA, AAMODT NO.:  [id number]  MEDICAL RECORD NO.:  [id number]  LOCATION:                                 FACILITY:  PHYSICIAN:  Loreta Ave, M.D. DATE OF BIRTH:  [birth year]  DATE OF ADMISSION:  03/29/2011 DATE OF DISCHARGE:  04/01/2011                              DISCHARGE SUMMARY   FINAL DIAGNOSES: 1. Status post right total knee replacement for end-stage degenerative     joint disease. 2. Gastroesophageal reflux disease. 3. Depression. 4. History of diverticulitis.  HISTORY OF PRESENT ILLNESS:  An 77 year old white female with history of end-stage DJD right knee and chronic pain presented to our office for preop evaluation for total knee replacement.  She had progressively worsening pain with no response with conservative treatment. Significant decrease in her daily activities due to the ongoing complaint.  HOSPITAL COURSE:  On March 29, 2011, the patient was taken to the [facility] OR and a right total knee replacement procedure performed.  SURGEON:  Loreta Ave, MD  ASSISTANT:  Genene Churn. Barry Dienes, PA-C  ANESTHESIA:  General with femoral nerve block.  SPECIMENS:  No specimens.  ESTIMATED BLOOD LOSS:  Minimal.  TOURNIQUET TIME:  64 minutes.  DRAINS:  One Hemovac drain placed.  No surgical or anesthesia complications, and the patient was transferred to recovery in stable condition.  After arriving to the orthopedic unit on pharmacy protocol, Coumadin, and Lovenox started for DVT prophylaxis.  Placed in the CPM machine per protocol.  On March 30, 2011, the patient doing well without complaints.  WBC 7.5, hematocrit 33.1, hemoglobin 10.5, platelets 180. Sodium 137, potassium 4.1, chloride 104, CO2 of 24, BUN 8, creatinine 0.68, glucose 114, INR 1.15.  Dressing clean, dry, and intact.  Calf nontender, neurovascularly intact.  Skin warm and dry.  Planning transfer to [facility] over the weekend for rehab.   Discontinued Dilaudid.  PT/OT consults.  On March 31, 2011, the patient doing well. WBC 8.1, hematocrit 29.9, hemoglobin 9.5, platelets 185.  Sodium 138, potassium 3.8, chloride 105, CO2 of 28, BUN 5, creatinine 0.61, glucose 126, INR 1.54.  Knee wound looks good and staples intact.  No drainage or sinus infection.  Calf nontender, neurovascularly intact.  Skin warm and dry.  Anticipate transfer to [facility].  DISCHARGE MEDICATIONS: 1. Norco 7.5/325 one to two tabs p.o. q.4-6 h. p.r.n. for pain. 2. Robaxin 500 mg 1 tab p.o. q.6 h. p.r.n. spasms. 3. Lovenox 30 mg 1 subcu injection q.12 h. and discontinue when     Coumadin is therapeutic with INR 2 to 3. 4. Coumadin pharmacy protocol.  Maintain INR 2-3 x4 weeks postop for     DVT prophylaxis. 5. Colace 100 mg p.o. b.i.d. 6. Align probiotics 1 tab p.o. daily. 7. Carafate 2 teaspoons p.o. daily as needed. 8. Clorazepate 7.5 mg 1 tab p.o. at bedtime. 9. Florastor 1 tab p.o. daily. 10.Latanoprost 0.005% one drop both eyes at bedtime. 11.Nexium 40 mg p.o. at bedtime. 12.Sertraline 50 mg 1 tab p.o. daily.  DISPOSITION ON TRANSFER:  To [place] for rehab.  CONDITION:  Good and stable.  INSTRUCTIONS:  At the facility, the patient will continue  PT and OT to improve ambulation and knee range of motion and strengthening.  Weight bear as tolerated with walker and can progress to a single prong cane as tolerated.  CPM machine 0-70 degrees 6-8 hours daily and increase by 10 degrees daily as tolerated.  Daily dressing changes with 4 x 4 gauze and can apply TED hose over this.  Do not apply any creams or ointments to her incision.  Okay to shower, but no tub soaking.  Coumadin x4 weeks postop for DVT prophylaxis and discontinue Lovenox when Coumadin is therapeutic with INR 2-3.  Knee staples will be removed in our office at 2 weeks postop and at that time she will be seen by Dr. Eulah Pont.  Please call our office with any  questions or concerns.     Genene Churn. Denton Meek.   ______________________________ Loreta Ave, M.D.    JMO/MEDQ  D:  03/31/2011  T:  03/31/2011  Job:  [id number]

## 2011-04-10 LAB — CULTURE, BLOOD (ROUTINE X 2)
Culture  Setup Time: 201211062209
Culture: NO GROWTH

## 2011-04-12 NOTE — ED Provider Notes (Signed)
Medical screening examination/treatment/procedure(s) were performed by non-physician practitioner and as supervising physician I was immediately available for consultation/collaboration.  I evaluated the patient along with Dr. Aubery Lapping.  The patient had fever at rehab center.  No elevation of wbc, UA and cxr are unremarkable.  The incision looks great.  There is no warmth, swelling, drainage, or erythema and the patient appears well without complaint.  Will discharge to ecf awaiting blood cultures.  Will not treat with antibiotics as no cause was identified.    Geoffery Lyons, MD 04/12/11 [phone]

## 2011-04-17 NOTE — Op Note (Signed)
NAMEMarland Kitchen  Vanessa, CHRISTENSON NO.:  [id number]  MEDICAL RECORD NO.:  [id number]  LOCATION:  5030                         FACILITY:  [facility]  PHYSICIAN:  Loreta Ave, M.D. DATE OF BIRTH:  January 06, 1926  DATE OF PROCEDURE:  03/29/2011 DATE OF DISCHARGE:                              OPERATIVE REPORT   PREOPERATIVE DIAGNOSIS:  Right knee end-stage degenerative arthritis, varus alignment.  POSTOPERATIVE DIAGNOSIS:  Right knee end-stage degenerative arthritis, varus alignment.  PROCEDURE:  Right knee modified minimally invasive total knee replacement, Stryker Triathlon prosthesis.  Soft tissue balancing. Cemented pegged posterior stabilized #4 femoral component.  Cemented #4 tibial component, 9-mm polyethylene insert.  Cemented resurfacing 32-mm patellar component.  SURGEON:  Loreta Ave, MD.  ASSISTANT:  Genene Churn. Barry Dienes, [place], present throughout the entire case necessary for timely completion of procedure.  ANESTHESIA:  General.  BLOOD LOSS:  Minimal.  SPECIMENS:  None.  COUNTS:  None.  COMPLICATION:  None.  DRESSINGS:  Soft compressive knee immobilizer.  DRAINS:  Hemovac x1.  TOURNIQUET TIME:  One hour.  DESCRIPTION OF PROCEDURE:  The patient was brought to the operating room, placed on the operating table in supine position.  After adequate anesthesia had been obtained, tourniquet applied apprised to the right leg.  Prepped and draped in usual sterile fashion.  Exsanguinated with elevation of Esmarch.  Tourniquet inflated to 350 mmHg.  Knee examined. Very mild flexion contracture.  5 degrees of varus partially correctable.  Anterior incision above the patella down to the tibial tubercle.  Medial arthrotomy, vastus splitting, preserving quad tendon. Knee exposed.  Medial capsule release.  Grade 4 changes, most marked medial.  Remnants of menisci, periarticular spurs, cruciate ligaments excised.  Intramedullary guide in the femur.  10 mm resection, 5  degrees of valgus.  Using epicondylar axis, sized, cut, and fitted for posterior stabilized #4 component.  Flexion and extension and balancing was assessed and was excellent.  Attention turned to the tibia. Extramedullary guide.  Three degree posterior slope cut.  Also size a #4 component.  All recess examined to be sure.  All loose bodies and spurs were removed.  Patella exposed.  Posterior 10 mm removed.  Drilled, sized, and fitted for 32 mm component.  Trials put in place throughout. A #4 above and below, 9 mm insert, and a 32 patella.  With this excellent by mechanical axis, good patellofemoral tracking, good stability, nicely balanced flexion extension.  Tibia was marked for rotation and hand reamed.  All trials removed.  Copious irrigation. Cement prepared, placed on all components, firmly seated.  Polyethylene attached to tibia.  Knee reduced.  Patella held with a clamp.  Once the cement hardened, it was reexamined.  Again, pleased with mechanical axis, balancing, stability, and patellofemoral tracking.  Hemovac was placed through a separate stab wound.  Arthrotomy closed with #1 Vicryl. Skin and subcutaneous tissue with Vicryl and staples.  Sterile compressive dressing applied.  Tourniquet deflated and removed.  Knee immobilizer applied.  Anesthesia reversed.  Brought to the room. Tolerated surgery well.  No complications.     Loreta Ave, M.D.     DFM/MEDQ  D:  03/30/2011  T:  03/31/2011  Job:  [id number]  Electronically Signed by Mckinley Jewel M.D. on 04/17/2011 12:23:41 PM

## 2015-07-10 ENCOUNTER — Encounter (HOSPITAL_BASED_OUTPATIENT_CLINIC_OR_DEPARTMENT_OTHER): Payer: Self-pay | Admitting: *Deleted

## 2015-07-10 ENCOUNTER — Emergency Department (HOSPITAL_BASED_OUTPATIENT_CLINIC_OR_DEPARTMENT_OTHER)
Admission: EM | Admit: 2015-07-10 | Discharge: 2015-07-10 | Disposition: A | Discharge status: Home / Self Care | Payer: Medicare Other | Attending: Emergency Medicine | Admitting: Emergency Medicine

## 2015-07-10 ENCOUNTER — Emergency Department (HOSPITAL_BASED_OUTPATIENT_CLINIC_OR_DEPARTMENT_OTHER): Payer: Medicare Other

## 2015-07-10 DIAGNOSIS — F329 Major depressive disorder, single episode, unspecified: Secondary | ICD-10-CM | POA: Insufficient documentation

## 2015-07-10 DIAGNOSIS — R000 Tachycardia, unspecified: Secondary | ICD-10-CM | POA: Insufficient documentation

## 2015-07-10 DIAGNOSIS — R1110 Vomiting, unspecified: Secondary | ICD-10-CM

## 2015-07-10 DIAGNOSIS — K2970 Gastritis, unspecified, without bleeding: Secondary | ICD-10-CM

## 2015-07-10 DIAGNOSIS — R197 Diarrhea, unspecified: Secondary | ICD-10-CM

## 2015-07-10 DIAGNOSIS — H409 Unspecified glaucoma: Secondary | ICD-10-CM | POA: Insufficient documentation

## 2015-07-10 DIAGNOSIS — R1013 Epigastric pain: Secondary | ICD-10-CM | POA: Diagnosis present

## 2015-07-10 DIAGNOSIS — K219 Gastro-esophageal reflux disease without esophagitis: Secondary | ICD-10-CM | POA: Diagnosis not present

## 2015-07-10 DIAGNOSIS — Z79899 Other long term (current) drug therapy: Secondary | ICD-10-CM | POA: Diagnosis not present

## 2015-07-10 LAB — COMPREHENSIVE METABOLIC PANEL
ALT: 16 U/L (ref 14–54)
ANION GAP: 11 (ref 5–15)
AST: 27 U/L (ref 15–41)
Albumin: 3.8 g/dL (ref 3.5–5.0)
Alkaline Phosphatase: 61 U/L (ref 38–126)
BUN: 15 mg/dL (ref 6–20)
CHLORIDE: 105 mmol/L (ref 101–111)
CO2: 26 mmol/L (ref 22–32)
CREATININE: 0.86 mg/dL (ref 0.44–1.00)
Calcium: 9.2 mg/dL (ref 8.9–10.3)
GFR, EST NON AFRICAN AMERICAN: 59 mL/min — AB (ref >60)
Glucose, Bld: 114 mg/dL — ABNORMAL HIGH (ref 65–99)
Potassium: 3.7 mmol/L (ref 3.5–5.1)
SODIUM: 142 mmol/L (ref 135–145)
Total Bilirubin: 0.7 mg/dL (ref 0.3–1.2)
Total Protein: 6.5 g/dL (ref 6.5–8.1)

## 2015-07-10 LAB — CBC WITH DIFFERENTIAL/PLATELET
Basophils Absolute: 0 10*3/uL (ref 0.0–0.1)
Basophils Relative: 0 %
EOS ABS: 0.1 10*3/uL (ref 0.0–0.7)
EOS PCT: 1 %
HCT: 40.3 % (ref 36.0–46.0)
Hemoglobin: 12.7 g/dL (ref 12.0–15.0)
LYMPHS ABS: 2.4 10*3/uL (ref 0.7–4.0)
LYMPHS PCT: 21 %
MCH: 29 pg (ref 26.0–34.0)
MCHC: 31.5 g/dL (ref 30.0–36.0)
MCV: 92 fL (ref 78.0–100.0)
MONO ABS: 0.9 10*3/uL (ref 0.1–1.0)
MONOS PCT: 8 %
Neutro Abs: 8 10*3/uL — ABNORMAL HIGH (ref 1.7–7.7)
Neutrophils Relative %: 70 %
PLATELETS: 312 10*3/uL (ref 150–400)
RBC: 4.38 MIL/uL (ref 3.87–5.11)
RDW: 13.7 % (ref 11.5–15.5)
WBC: 11.4 10*3/uL — ABNORMAL HIGH (ref 4.0–10.5)

## 2015-07-10 LAB — URINALYSIS, ROUTINE W REFLEX MICROSCOPIC
Bilirubin Urine: NEGATIVE
GLUCOSE, UA: NEGATIVE mg/dL
Hgb urine dipstick: NEGATIVE
Ketones, ur: 15 mg/dL — AB
LEUKOCYTES UA: NEGATIVE
Nitrite: NEGATIVE
PH: 5 (ref 5.0–8.0)
PROTEIN: NEGATIVE mg/dL

## 2015-07-10 LAB — LIPASE, BLOOD: Lipase: 23 U/L (ref 11–51)

## 2015-07-10 LAB — I-STAT CG4 LACTIC ACID, ED: Lactic Acid, Venous: 0.79 mmol/L (ref 0.5–2.0)

## 2015-07-10 MED ORDER — MORPHINE SULFATE (PF) 4 MG/ML IV SOLN
4.0000 mg | Freq: Once | INTRAVENOUS | Status: AC
  Administered 2015-07-10: 4 mg via INTRAVENOUS
  Filled 2015-07-10: qty 1

## 2015-07-10 MED ORDER — ONDANSETRON 4 MG PO TBDP: ORAL_TABLET | ORAL | Status: DC

## 2015-07-10 MED ORDER — IOHEXOL 300 MG/ML  SOLN
100.0000 mL | Freq: Once | INTRAMUSCULAR | Status: AC | PRN
  Administered 2015-07-10: 100 mL via INTRAVENOUS

## 2015-07-10 MED ORDER — SODIUM CHLORIDE 0.9 % IV BOLUS (SEPSIS)
500.0000 mL | Freq: Once | INTRAVENOUS | Status: AC
  Administered 2015-07-10: 500 mL via INTRAVENOUS

## 2015-07-10 MED ORDER — PANTOPRAZOLE SODIUM 40 MG IV SOLR
40.0000 mg | Freq: Once | INTRAVENOUS | Status: AC
  Administered 2015-07-10: 40 mg via INTRAVENOUS
  Filled 2015-07-10: qty 40

## 2015-07-10 MED ORDER — HYDROCODONE-ACETAMINOPHEN 5-325 MG PO TABS: 1.0000 | ORAL_TABLET | ORAL | Status: DC | PRN

## 2015-07-10 MED ORDER — GI COCKTAIL ~~LOC~~
30.0000 mL | Freq: Once | ORAL | Status: AC
  Administered 2015-07-10: 30 mL via ORAL
  Filled 2015-07-10: qty 30

## 2015-07-10 MED ORDER — ONDANSETRON HCL 4 MG/2ML IJ SOLN
4.0000 mg | Freq: Once | INTRAMUSCULAR | Status: AC
  Administered 2015-07-10: 4 mg via INTRAVENOUS
  Filled 2015-07-10: qty 2

## 2015-07-10 MED ORDER — IOHEXOL 300 MG/ML  SOLN
50.0000 mL | Freq: Once | INTRAMUSCULAR | Status: AC | PRN
  Administered 2015-07-10: 50 mL via ORAL

## 2015-07-10 NOTE — Discharge Instructions (Signed)
Gastritis, Adult °Gastritis is soreness and swelling (inflammation) of the lining of the stomach. Gastritis can develop as a sudden onset (acute) or long-term (chronic) condition. If gastritis is not treated, it can lead to stomach bleeding and ulcers. °CAUSES  °Gastritis occurs when the stomach lining is weak or damaged. Digestive juices from the stomach then inflame the weakened stomach lining. The stomach lining may be weak or damaged due to viral or bacterial infections. One common bacterial infection is the Helicobacter pylori infection. Gastritis can also result from excessive alcohol consumption, taking certain medicines, or having too much acid in the stomach.  °SYMPTOMS  °In some cases, there are no symptoms. When symptoms are present, they may include: °· Pain or a burning sensation in the upper abdomen. °· Nausea. °· Vomiting. °· An uncomfortable feeling of fullness after eating. °DIAGNOSIS  °Your caregiver may suspect you have gastritis based on your symptoms and a physical exam. To determine the cause of your gastritis, your caregiver may perform the following: °· Blood or stool tests to check for the H pylori bacterium. °· Gastroscopy. A thin, flexible tube (endoscope) is passed down the esophagus and into the stomach. The endoscope has a light and camera on the end. Your caregiver uses the endoscope to view the inside of the stomach. °· Taking a tissue sample (biopsy) from the stomach to examine under a microscope. °TREATMENT  °Depending on the cause of your gastritis, medicines may be prescribed. If you have a bacterial infection, such as an H pylori infection, antibiotics may be given. If your gastritis is caused by too much acid in the stomach, H2 blockers or antacids may be given. Your caregiver may recommend that you stop taking aspirin, ibuprofen, or other nonsteroidal anti-inflammatory drugs (NSAIDs). °HOME CARE INSTRUCTIONS °· Only take over-the-counter or prescription medicines as directed by  your caregiver. °· If you were given antibiotic medicines, take them as directed. Finish them even if you start to feel better. °· Drink enough fluids to keep your urine clear or pale yellow. °· Avoid foods and drinks that make your symptoms worse, such as: °¨ Caffeine or alcoholic drinks. °¨ Chocolate. °¨ Peppermint or mint flavorings. °¨ Garlic and onions. °¨ Spicy foods. °¨ Citrus fruits, such as oranges, lemons, or limes. °¨ Tomato-based foods such as sauce, chili, salsa, and pizza. °¨ Fried and fatty foods. °· Eat small, frequent meals instead of large meals. °SEEK IMMEDIATE MEDICAL CARE IF:  °· You have black or dark red stools. °· You vomit blood or material that looks like coffee grounds. °· You are unable to keep fluids down. °· Your abdominal pain gets worse. °· You have a fever. °· You do not feel better after 1 week. °· You have any other questions or concerns. °MAKE SURE YOU: °· Understand these instructions. °· Will watch your condition. °· Will get help right away if you are not doing well or get worse. °  °This information is not intended to replace advice given to you by your health care provider. Make sure you discuss any questions you have with your health care provider. °  °Document Released: 05/09/2001 Document Revised: 11/14/2011 Document Reviewed: 06/28/2011 °Elsevier Interactive Patient Education ©2016 [employer] ° °Nausea and Vomiting °Nausea is a sick feeling that often comes before throwing up (vomiting). Vomiting is a reflex where stomach contents come out of your mouth. Vomiting can cause severe loss of body fluids (dehydration). Children and elderly adults can become dehydrated quickly, especially if they also have   diarrhea. Nausea and vomiting are symptoms of a condition or disease. It is important to find the cause of your symptoms. °CAUSES  °· Direct irritation of the stomach lining. This irritation can result from increased acid production (gastroesophageal reflux disease),  infection, food poisoning, taking certain medicines (such as nonsteroidal anti-inflammatory drugs), alcohol use, or tobacco use. °· Signals from the brain. These signals could be caused by a headache, heat exposure, an inner ear disturbance, increased pressure in the brain from injury, infection, a tumor, or a concussion, pain, emotional stimulus, or metabolic problems. °· An obstruction in the gastrointestinal tract (bowel obstruction). °· Illnesses such as diabetes, hepatitis, gallbladder problems, appendicitis, kidney problems, cancer, sepsis, atypical symptoms of a heart attack, or eating disorders. °· Medical treatments such as chemotherapy and radiation. °· Receiving medicine that makes you sleep (general anesthetic) during surgery. °DIAGNOSIS °Your caregiver may ask for tests to be done if the problems do not improve after a few days. Tests may also be done if symptoms are severe or if the reason for the nausea and vomiting is not clear. Tests may include: °· Urine tests. °· Blood tests. °· Stool tests. °· Cultures (to look for evidence of infection). °· X-rays or other imaging studies. °Test results can help your caregiver make decisions about treatment or the need for additional tests. °TREATMENT °You need to stay well hydrated. Drink frequently but in small amounts. You may wish to drink water, sports drinks, clear broth, or eat frozen ice pops or gelatin dessert to help stay hydrated. When you eat, eating slowly may help prevent nausea. There are also some antinausea medicines that may help prevent nausea. °HOME CARE INSTRUCTIONS  °· Take all medicine as directed by your caregiver. °· If you do not have an appetite, do not force yourself to eat. However, you must continue to drink fluids. °· If you have an appetite, eat a normal diet unless your caregiver tells you differently. °¨ Eat a variety of complex carbohydrates (rice, wheat, potatoes, bread), lean meats, yogurt, fruits, and vegetables. °¨ Avoid  high-fat foods because they are more difficult to digest. °· Drink enough water and fluids to keep your urine clear or pale yellow. °· If you are dehydrated, ask your caregiver for specific rehydration instructions. Signs of dehydration may include: °¨ Severe thirst. °¨ Dry lips and mouth. °¨ Dizziness. °¨ Dark urine. °¨ Decreasing urine frequency and amount. °¨ Confusion. °¨ Rapid breathing or pulse. °SEEK IMMEDIATE MEDICAL CARE IF:  °· You have blood or brown flecks (like coffee grounds) in your vomit. °· You have black or bloody stools. °· You have a severe headache or stiff neck. °· You are confused. °· You have severe abdominal pain. °· You have chest pain or trouble breathing. °· You do not urinate at least once every 8 hours. °· You develop cold or clammy skin. °· You continue to vomit for longer than 24 to 48 hours. °· You have a fever. °MAKE SURE YOU:  °· Understand these instructions. °· Will watch your condition. °· Will get help right away if you are not doing well or get worse. °  °This information is not intended to replace advice given to you by your health care provider. Make sure you discuss any questions you have with your health care provider. °  °Document Released: 05/15/2005 Document Revised: 08/07/2011 Document Reviewed: 10/12/2010 °Elsevier Interactive Patient Education ©2016 [employer]. ° °

## 2015-07-10 NOTE — ED Provider Notes (Signed)
CSN: [id number]     Arrival date & time 07/10/15  1851 History  By signing my name below, I, Budd Palmer, attest that this documentation has been prepared under the direction and in the presence of Loren Racer, MD. Electronically Signed: Budd Palmer, ED Scribe. 07/10/2015. 7:32 PM.      Chief Complaint  Patient presents with  . Abdominal Pain   The history is provided by the patient. No language interpreter was used.   HPI Comments: Vanessa Fernandez is a 82 y.o. female with a PMHx of diverticulitis and PUD who presents to the Emergency Department complaining of constant, burning epigastric pain onset 3 days ago. She reports associated nausea, vomiting (resolved as of today), and diarrhea (4x today, green). She notes she has taken some Carafate and Pepto Bismol with mild relief. She states she was able to sip some tea and keep it down. She notes her gastroenterologist is Dr Jasmine December. She reports a PMHx of an ulcer and notes that this pain does not feel like her usual GERD. She also states that her diverticulitis pains are usually only in her lower abdomen. She notes a PSHx of hysterectomy, cholecystectomy and appendectomy "a long time ago." Pt denies back pain, black stool, and leg swelling or pain.   Past Medical History  Diagnosis Date  . Depression   . Glaucoma   . Diverticulitis   . GERD (gastroesophageal reflux disease)    Past Surgical History  Procedure Laterality Date  . Joint replacement     History reviewed. No pertinent family history. Social History  Substance Use Topics  . Smoking status: Never Smoker   . Smokeless tobacco: None  . Alcohol Use: No   OB History    No data available     Review of Systems  Constitutional: Negative for fever and chills.  Respiratory: Negative for cough and shortness of breath.   Cardiovascular: Negative for leg swelling.  Gastrointestinal: Positive for nausea, vomiting, abdominal pain and diarrhea. Negative for constipation,  blood in stool and abdominal distention.  Genitourinary: Negative for dysuria, frequency and flank pain.  Musculoskeletal: Negative for myalgias and back pain.  Skin: Negative for rash and wound.  Neurological: Negative for dizziness, weakness, light-headedness, numbness and headaches.  All other systems reviewed and are negative.   Allergies  Aspirin; Bextra; Celebrex; Codeine; Ibuprofen; Naproxen; and Sulfa antibiotics  Home Medications   Prior to Admission medications   Medication Sig Start Date End Date Taking? Authorizing Provider  acetaminophen (TYLENOL) 500 MG tablet Take 1,000 mg by mouth every 6 (six) hours as needed. For pain     Historical Provider, MD  clorazepate (TRANXENE) 7.5 MG tablet Take 7.5 mg by mouth at bedtime as needed. For sleep    Historical Provider, MD  cromolyn (NASALCROM) 5.2 MG/ACT nasal spray Place 1 spray into the nose daily as needed. For congestion     Historical Provider, MD  esomeprazole (NEXIUM) 40 MG capsule Take 40 mg by mouth every evening.      Historical Provider, MD  HYDROcodone-acetaminophen (NORCO) 5-325 MG tablet Take 1-2 tablets by mouth every 4 (four) hours as needed for severe pain. 07/10/15   Loren Racer, MD  latanoprost (XALATAN) 0.005 % ophthalmic solution Place 1 drop into both eyes at bedtime.      Historical Provider, MD  Multiple Vitamins-Minerals (MULTIVITAMINS THER. W/MINERALS) TABS Take 1 tablet by mouth daily.      Historical Provider, MD  ondansetron (ZOFRAN ODT) 4 MG disintegrating tablet   ODT q4 hours prn nausea/vomit 07/10/15   Loren Racer, MD  Probiotic Product (ALIGN PO) Take 1 tablet by mouth daily.      Historical Provider, MD  saccharomyces boulardii (FLORASTOR) 250 MG capsule Take 250 mg by mouth daily.      Historical Provider, MD  sertraline (ZOLOFT) 50 MG tablet Take 50 mg by mouth every morning.     Historical Provider, MD  sucralfate (CARAFATE) 1 GM/10ML suspension Take 1 g by mouth 3 (three) times daily  before meals. As needed For acid reflux     Historical Provider, MD   BP 113/48 mmHg  Pulse 81  Temp(Src) 98.1 F (36.7 C) (Oral)  Resp 18  Ht 5\' 5"  (1.651 m)  Wt 184 lb (83.462 kg)  BMI 30.62 kg/m2  SpO2 93% Physical Exam  Constitutional: She is oriented to person, place, and time. She appears well-developed and well-nourished. No distress.  HENT:  Head: Normocephalic and atraumatic.  Mouth/Throat: Oropharynx is clear and moist. No oropharyngeal exudate.  Eyes: EOM are normal. Pupils are equal, round, and reactive to light.  Neck: Normal range of motion. Neck supple.  Cardiovascular: Regular rhythm.   Tachycardia  Pulmonary/Chest: Effort normal and breath sounds normal. No respiratory distress. She has no wheezes. She has no rales. She exhibits no tenderness.  Abdominal: Soft. She exhibits distension. She exhibits no mass. There is tenderness (diffuse abdominal tenderness with voluntary guarding). There is guarding. There is no rebound.  Decreased bowel sounds throughout  Musculoskeletal: Normal range of motion. She exhibits no edema or tenderness.  No CVA tenderness. No calf swelling or tenderness.  Neurological: She is alert and oriented to person, place, and time.  Moves all extremities without deficit. Sensation is grossly intact.  Skin: Skin is warm and dry. No rash noted. No erythema.  Psychiatric: She has a normal mood and affect. Her behavior is normal.  Nursing note and vitals reviewed.   ED Course  Procedures  DIAGNOSTIC STUDIES: Oxygen Saturation is 96% on RA, adequate by my interpretation.    COORDINATION OF CARE: 7:31 PM - Discussed plans to order diagnostic imaging, anti-nausea medication, and pain medication. Pt advised of plan for treatment and pt agrees.  Labs Review Labs Reviewed  CBC WITH DIFFERENTIAL/PLATELET - Abnormal; Notable for the following:    WBC 11.4 (*)    Neutro Abs 8.0 (*)    All other components within normal limits  COMPREHENSIVE  METABOLIC PANEL - Abnormal; Notable for the following:    Glucose, Bld 114 (*)    GFR calc non Af Amer 59 (*)    All other components within normal limits  URINALYSIS, ROUTINE W REFLEX MICROSCOPIC (NOT AT [facility]) - Abnormal; Notable for the following:    Specific Gravity, Urine >1.046 (*)    Ketones, ur 15 (*)    All other components within normal limits  LIPASE, BLOOD  LACTIC ACID, PLASMA  I-STAT CG4 LACTIC ACID, ED    Imaging Review Ct Abdomen Pelvis W Contrast  07/10/2015  CLINICAL DATA:  Acute onset of burning epigastric abdominal pain. Nausea and vomiting. Diarrhea. Leukocytosis. Initial encounter. EXAM: CT ABDOMEN AND PELVIS WITH CONTRAST TECHNIQUE: Multidetector CT imaging of the abdomen and pelvis was performed using the standard protocol following bolus administration of intravenous contrast. CONTRAST:  OMNIPAQUE IOHEXOL 300 MG/ML SOLN, 50mL OMNIPAQUE IOHEXOL 300 MG/ML SOLN COMPARISON:  CT of the abdomen and pelvis performed on 10/30/2014, and MRI of the abdomen performed 11/06/2014 FINDINGS: Minimal bibasilar atelectasis is noted.  Mild calcification is noted at the mitral valve. There is distention of the intrahepatic biliary ducts, within normal limits status post cholecystectomy. The spleen is unremarkable in appearance. The liver is otherwise grossly unremarkable. The pancreas and adrenal glands are unremarkable. Mild nonspecific perinephric stranding is noted bilaterally. A few small right renal cysts are seen, measuring up to 7 mm in size. There is no evidence of hydronephrosis. No renal or ureteral stones are identified. The small bowel is unremarkable in appearance. The stomach is within normal limits. No acute vascular abnormalities are seen. Scattered calcification is noted along the abdominal aorta and its branches. The appendix is apparently diminutive and arises adjacent to the ileocecal junction, somewhat unusual in appearance. There is no evidence of appendicitis. Trace  fluid is noted tracking about the lower abdomen, with minimal associated soft tissue stranding. Mild scattered diverticulosis is noted along the entirety of the colon, without definite evidence of diverticulitis. The colon is partially filled with fluid. The bladder is mildly distended. Mild apparent bladder wall thickening and surrounding soft tissue inflammation may reflect cystitis. The patient is status post hysterectomy. No suspicious adnexal masses are seen. No inguinal lymphadenopathy is seen. No acute osseous abnormalities are identified. Facet disease is noted at the lower lumbar spine. IMPRESSION: 1. Mild bladder wall thickening and soft tissue inflammation about the bladder raises question for cystitis. 2. Trace fluid tracking about the lower abdomen, with minimal associated soft tissue stranding. This is of uncertain significance. The colon is partially filled with fluid, corresponding to the patient's diarrhea. Given the patient's symptoms, this could reflect a mild infectious or inflammatory process. 3. Mild calcification at the mitral valve. 4. Few small right renal cysts noted. 5. Scattered calcification along the abdominal aorta and its branches. 6. Mild scattered diverticulosis along the entirety of the colon, without evidence of diverticulitis. Electronically Signed   By: Roanna Raider M.D.   On: 07/10/2015 21:39   I have personally reviewed and evaluated these images and lab results as part of my medical decision-making.   EKG Interpretation None      MDM   Final diagnoses:  Gastritis  Vomiting and diarrhea  Epigastric pain   I personally performed the services described in this documentation, which was scribed in my presence. The recorded information has been reviewed and is accurate.   Patient is feeling much better after GI cocktail. Lactic acid is normal. Mild elevation in white blood cell count. She is tolerating liquids by mouth. Have advised her to follow-up with her  gastroenterologist. Return precautions have been given.  Loren Racer, MD 07/10/15 2308

## 2015-07-10 NOTE — ED Notes (Signed)
Epigastric abdominal pain with radiation to her left side since Wednesday. Reports N/V/D.

## 2016-07-17 IMAGING — CT CT ABD-PELV W/ CM
2 of 5 series · 15 of 46 positions shown, 17 images · IV contrast (APPLIED)
Comparison: CT of the abdomen and pelvis performed on 10/30/2014,
and MRI of the abdomen performed 11/06/2014

CLINICAL DATA: Acute onset of burning epigastric abdominal pain.
Nausea and vomiting. Diarrhea. Leukocytosis. Initial encounter.

EXAM:
CT ABDOMEN AND PELVIS WITH CONTRAST
TECHNIQUE: Multidetector CT imaging of the abdomen and pelvis was performed
using the standard protocol following bolus administration of
intravenous contrast.
CONTRAST:  100mL OMNIPAQUE IOHEXOL 300 MG/ML SOLN, 50mL OMNIPAQUE
IOHEXOL 300 MG/ML SOLN

[Series 2: axial st · axial · 0.81mm/px · z∈[+298,+714]mm · 12 of 93 slices shown, 14 images]
[im 5/93  soft-tissue]
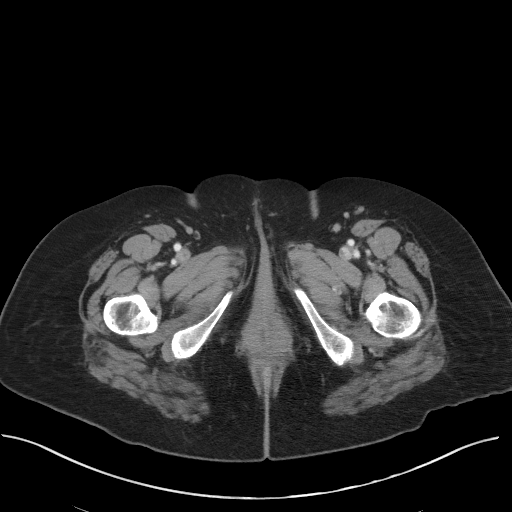
[im 5/93  bone]
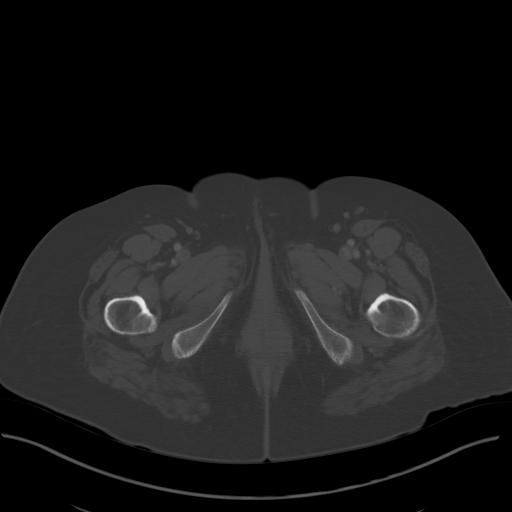
[im 15/93  soft-tissue]
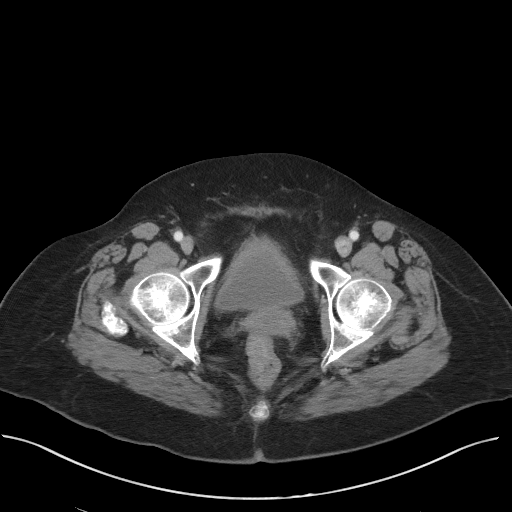
[im 20/93  soft-tissue]
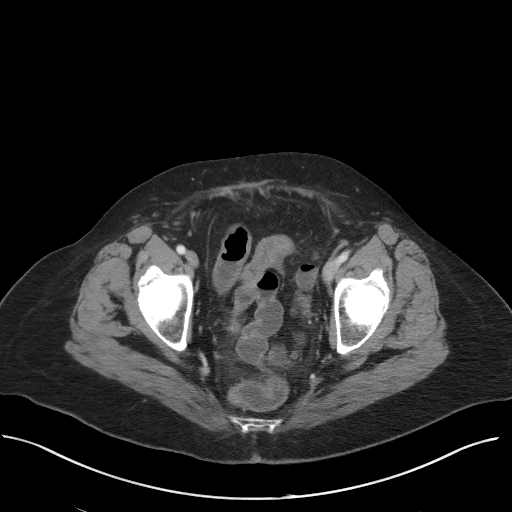
[im 30/93  soft-tissue]
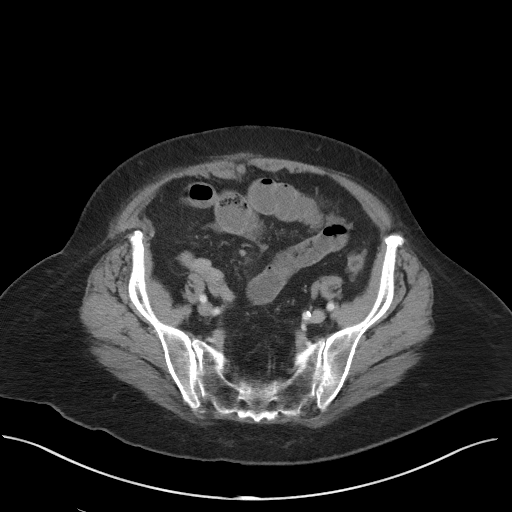
[im 34/93  soft-tissue]
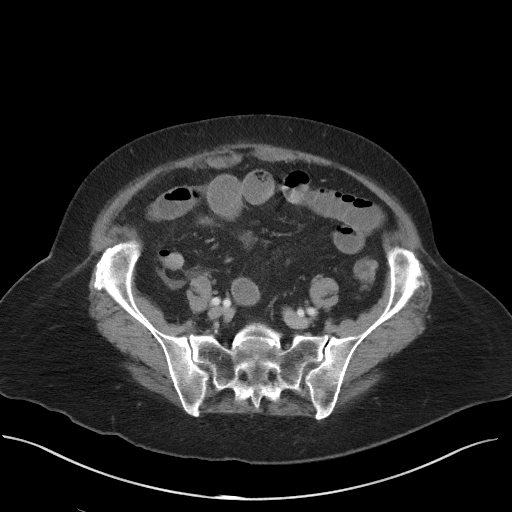
[im 44/93  soft-tissue]
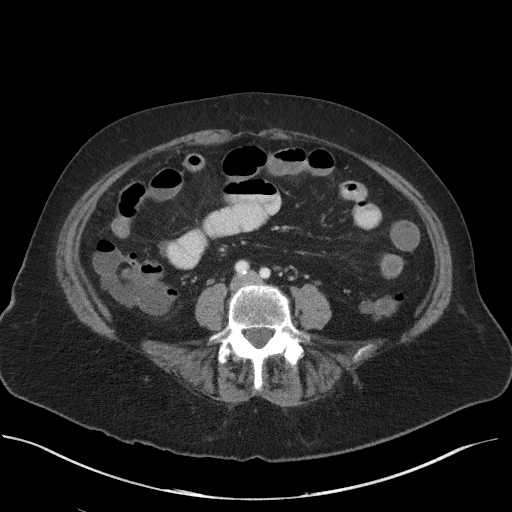
[im 49/93  soft-tissue]
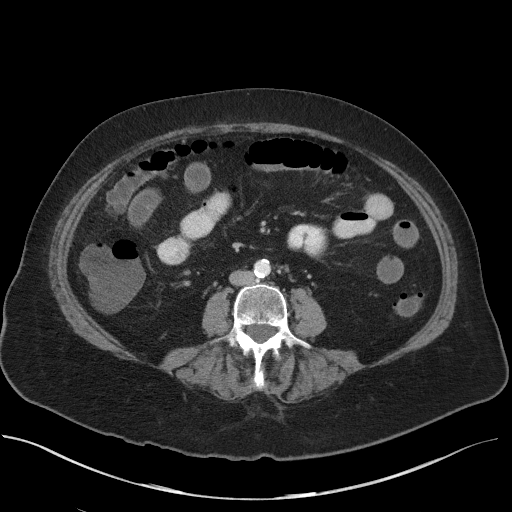
[im 59/93  soft-tissue]
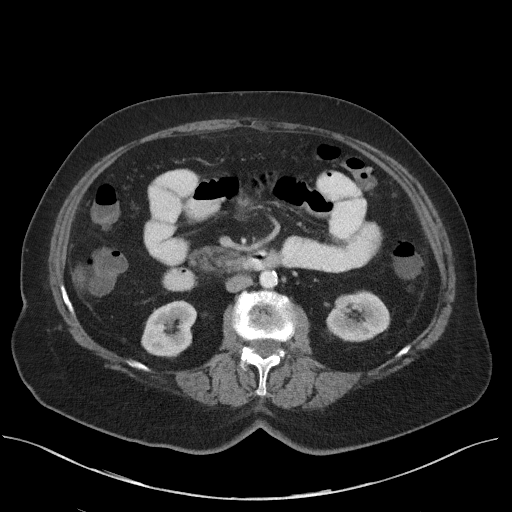
[im 63/93  soft-tissue]
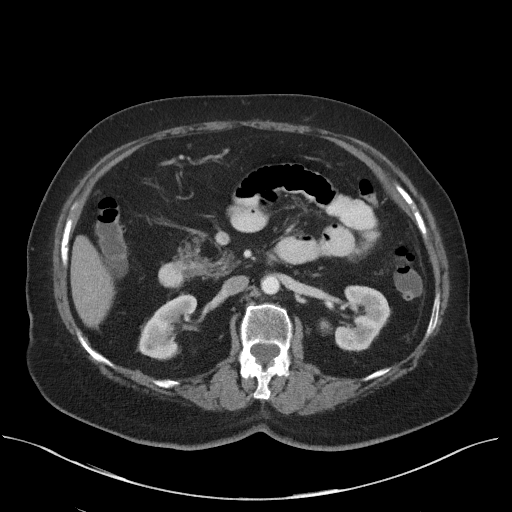
[im 63/93  bone]
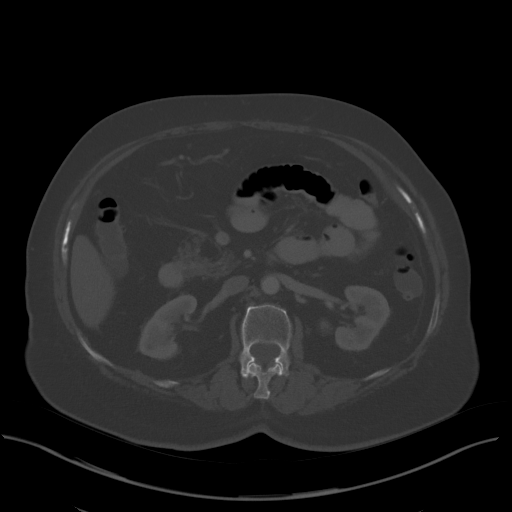
[im 73/93  soft-tissue]
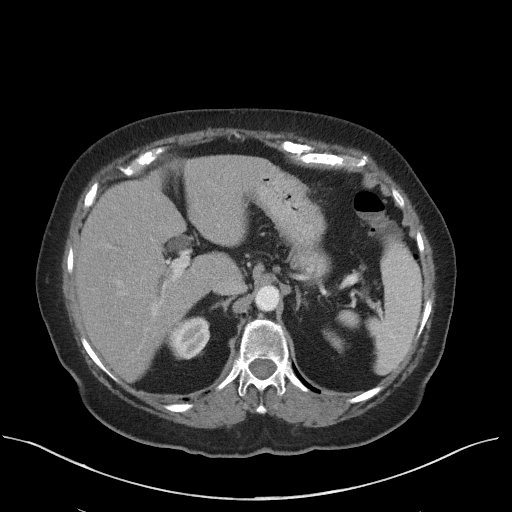
[im 78/93  soft-tissue]
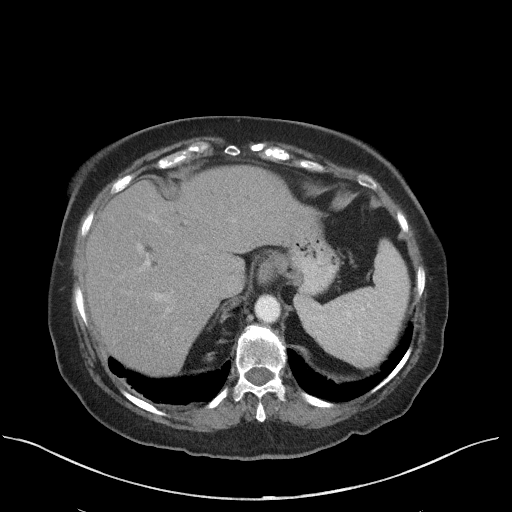
[im 88/93  soft-tissue]
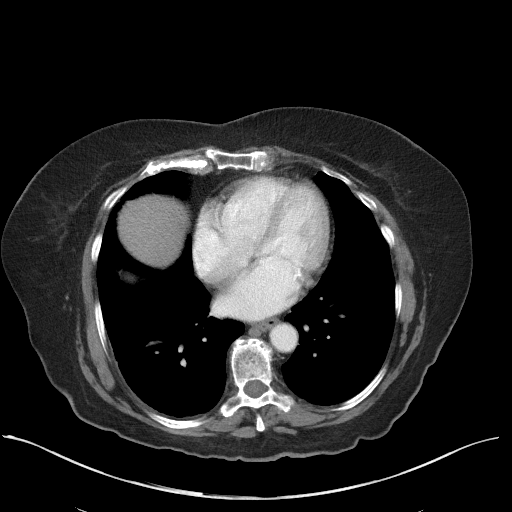

[Series 5: coronal st · coronal · 0.75mm/px · 3 of 101 slices shown]
[im 34/101  soft-tissue]
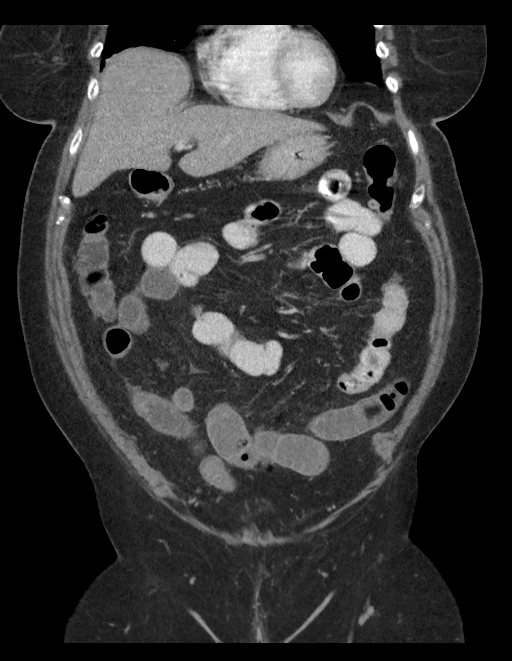
[im 45/101  soft-tissue]
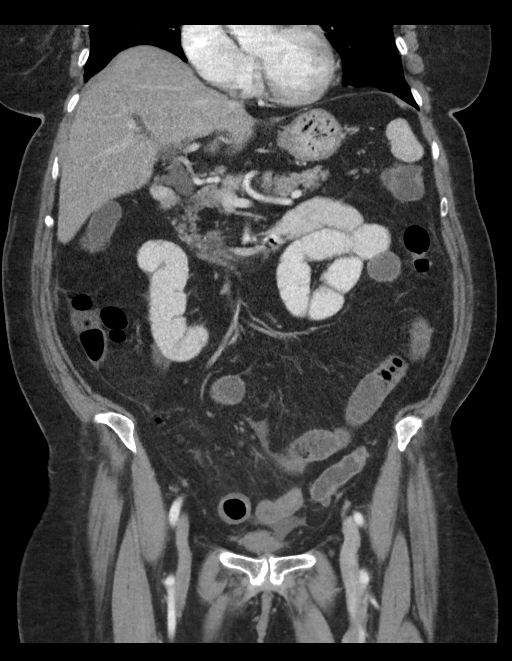
[im 56/101  soft-tissue]
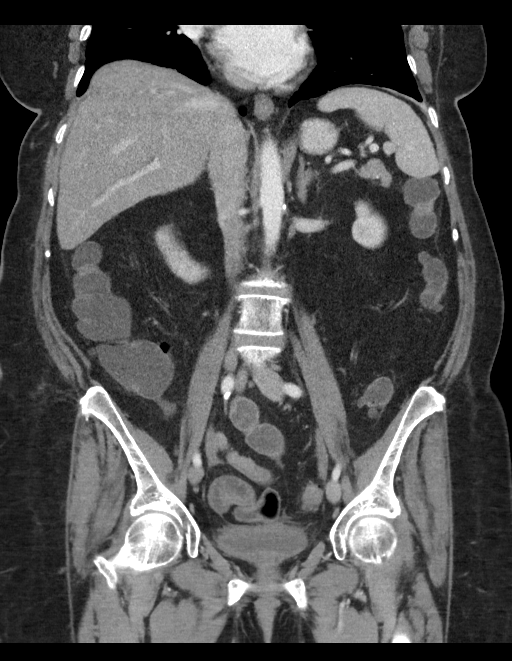

[15 of 46 positions shown; findings below may reference images not displayed]

FINDINGS: Minimal bibasilar atelectasis is noted. Mild calcification is noted
at the mitral valve.

There is distention of the intrahepatic biliary ducts, within normal
limits status post cholecystectomy. The spleen is unremarkable in
appearance. The liver is otherwise grossly unremarkable. The
pancreas and adrenal glands are unremarkable.

Mild nonspecific perinephric stranding is noted bilaterally. A few
small right renal cysts are seen, measuring up to 7 mm in size.
There is no evidence of hydronephrosis. No renal or ureteral stones
are identified.

The small bowel is unremarkable in appearance. The stomach is within
normal limits. No acute vascular abnormalities are seen. Scattered
calcification is noted along the abdominal aorta and its branches.

The appendix is apparently diminutive and arises adjacent to the
ileocecal junction, somewhat unusual in appearance. There is no
evidence of appendicitis. Trace fluid is noted tracking about the
lower abdomen, with minimal associated soft tissue stranding.

Mild scattered diverticulosis is noted along the entirety of the
colon, without definite evidence of diverticulitis. The colon is
partially filled with fluid.

The bladder is mildly distended. Mild apparent bladder wall
thickening and surrounding soft tissue inflammation may reflect
cystitis. The patient is status post hysterectomy. No suspicious
adnexal masses are seen. No inguinal lymphadenopathy is seen.

No acute osseous abnormalities are identified. Facet disease is
noted at the lower lumbar spine.
IMPRESSION: 1. Mild bladder wall thickening and soft tissue inflammation about
the bladder raises question for cystitis.
2. Trace fluid tracking about the lower abdomen, with minimal
associated soft tissue stranding. This is of uncertain significance.
The colon is partially filled with fluid, corresponding to the
patient's diarrhea. Given the patient's symptoms, this could reflect
a mild infectious or inflammatory process.
3. Mild calcification at the mitral valve.
4. Few small right renal cysts noted.
5. Scattered calcification along the abdominal aorta and its
branches.
6. Mild scattered diverticulosis along the entirety of the colon,
without evidence of diverticulitis.

## 2020-11-07 ENCOUNTER — Encounter (HOSPITAL_BASED_OUTPATIENT_CLINIC_OR_DEPARTMENT_OTHER): Payer: Self-pay | Admitting: Emergency Medicine

## 2020-11-07 ENCOUNTER — Other Ambulatory Visit: Payer: Self-pay

## 2020-11-07 ENCOUNTER — Emergency Department (HOSPITAL_BASED_OUTPATIENT_CLINIC_OR_DEPARTMENT_OTHER)
Admission: EM | Admit: 2020-11-07 | Discharge: 2020-11-07 | Disposition: A | Discharge status: Home / Self Care | Payer: Medicare Other | Attending: Emergency Medicine | Admitting: Emergency Medicine

## 2020-11-07 ENCOUNTER — Emergency Department (HOSPITAL_BASED_OUTPATIENT_CLINIC_OR_DEPARTMENT_OTHER): Payer: Medicare Other

## 2020-11-07 DIAGNOSIS — R1012 Left upper quadrant pain: Secondary | ICD-10-CM | POA: Diagnosis not present

## 2020-11-07 DIAGNOSIS — R197 Diarrhea, unspecified: Secondary | ICD-10-CM | POA: Insufficient documentation

## 2020-11-07 DIAGNOSIS — K219 Gastro-esophageal reflux disease without esophagitis: Secondary | ICD-10-CM | POA: Insufficient documentation

## 2020-11-07 DIAGNOSIS — R1010 Upper abdominal pain, unspecified: Secondary | ICD-10-CM

## 2020-11-07 LAB — CBC WITH DIFFERENTIAL/PLATELET
Abs Immature Granulocytes: 0.02 10*3/uL (ref 0.00–0.07)
Basophils Absolute: 0 10*3/uL (ref 0.0–0.1)
Basophils Relative: 0 %
Eosinophils Absolute: 0.1 10*3/uL (ref 0.0–0.5)
Eosinophils Relative: 2 %
HCT: 38.9 % (ref 36.0–46.0)
Hemoglobin: 13 g/dL (ref 12.0–15.0)
Immature Granulocytes: 0 %
Lymphocytes Relative: 23 %
Lymphs Abs: 1.9 10*3/uL (ref 0.7–4.0)
MCH: 30.5 pg (ref 26.0–34.0)
MCHC: 33.4 g/dL (ref 30.0–36.0)
MCV: 91.3 fL (ref 80.0–100.0)
Monocytes Absolute: 0.6 10*3/uL (ref 0.1–1.0)
Monocytes Relative: 8 %
Neutro Abs: 5.5 10*3/uL (ref 1.7–7.7)
Neutrophils Relative %: 67 %
Platelets: 248 10*3/uL (ref 150–400)
RBC: 4.26 MIL/uL (ref 3.87–5.11)
RDW: 13.1 % (ref 11.5–15.5)
WBC: 8.1 10*3/uL (ref 4.0–10.5)
nRBC: 0 % (ref 0.0–0.2)

## 2020-11-07 LAB — COMPREHENSIVE METABOLIC PANEL
ALT: 52 U/L — ABNORMAL HIGH (ref 0–44)
AST: 97 U/L — ABNORMAL HIGH (ref 15–41)
Albumin: 4 g/dL (ref 3.5–5.0)
Alkaline Phosphatase: 87 U/L (ref 38–126)
Anion gap: 9 (ref 5–15)
BUN: 18 mg/dL (ref 8–23)
CO2: 26 mmol/L (ref 22–32)
Calcium: 9.5 mg/dL (ref 8.9–10.3)
Chloride: 102 mmol/L (ref 98–111)
Creatinine, Ser: 0.84 mg/dL (ref 0.44–1.00)
GFR, Estimated: >60 mL/min (ref >60)
Glucose, Bld: 107 mg/dL — ABNORMAL HIGH (ref 70–99)
Potassium: 4 mmol/L (ref 3.5–5.1)
Sodium: 137 mmol/L (ref 135–145)
Total Bilirubin: 0.6 mg/dL (ref 0.3–1.2)
Total Protein: 6.6 g/dL (ref 6.5–8.1)

## 2020-11-07 LAB — LACTIC ACID, PLASMA: Lactic Acid, Venous: 1.2 mmol/L (ref 0.5–1.9)

## 2020-11-07 LAB — LIPASE, BLOOD: Lipase: 27 U/L (ref 11–51)

## 2020-11-07 MED ORDER — LACTATED RINGERS IV BOLUS
1000.0000 mL | Freq: Once | INTRAVENOUS | Status: AC
  Administered 2020-11-07: 1000 mL via INTRAVENOUS

## 2020-11-07 MED ORDER — LIDOCAINE VISCOUS HCL 2 % MT SOLN: 30.0000 mL | Freq: Two times a day (BID) | OROMUCOSAL | 0 refills | Status: DC | PRN

## 2020-11-07 MED ORDER — ALUM & MAG HYDROXIDE-SIMETH 200-200-20 MG/5ML PO SUSP
30.0000 mL | Freq: Once | ORAL | Status: AC
  Administered 2020-11-07: 30 mL via ORAL
  Filled 2020-11-07: qty 30

## 2020-11-07 MED ORDER — FENTANYL CITRATE (PF) 100 MCG/2ML IJ SOLN
25.0000 ug | Freq: Once | INTRAMUSCULAR | Status: AC
  Administered 2020-11-07: 25 ug via INTRAVENOUS
  Filled 2020-11-07: qty 2

## 2020-11-07 MED ORDER — ONDANSETRON HCL 4 MG/2ML IJ SOLN
4.0000 mg | Freq: Once | INTRAMUSCULAR | Status: AC
  Administered 2020-11-07: 4 mg via INTRAVENOUS
  Filled 2020-11-07: qty 2

## 2020-11-07 MED ORDER — FENTANYL CITRATE (PF) 100 MCG/2ML IJ SOLN
12.5000 ug | Freq: Once | INTRAMUSCULAR | Status: AC
Start: 2020-11-07 — End: 2020-11-07
  Administered 2020-11-07: 12.5 ug via INTRAVENOUS
  Filled 2020-11-07: qty 2

## 2020-11-07 MED ORDER — LIDOCAINE VISCOUS HCL 2 % MT SOLN
15.0000 mL | Freq: Once | OROMUCOSAL | Status: AC
  Administered 2020-11-07: 15 mL via ORAL
  Filled 2020-11-07: qty 15

## 2020-11-07 NOTE — ED Notes (Signed)
Pt endorses diarrhea x 1 week when being assessed by EDP Anitra Lauth

## 2020-11-07 NOTE — ED Triage Notes (Signed)
Pt arrives pov with driver, c/o Left quadrant pain x several months, worsening this afternoon. Pt reports feeling "gassy" last night. Pt denies emesis, endorses nausea. Pt denies fever.

## 2020-11-07 NOTE — Discharge Instructions (Addendum)
The lab work and CAT scan today look normal.  Concerned that there may be ulcers in your stomach.  Avoid any spicy foods and no ibuprofen or Aleve products.  Also avoid Alka-Seltzer.  Continue taking the Carafate and famotidine.  If you start having worsening pain, vomiting, fever or other concerns please return to the emergency room.

## 2020-11-07 NOTE — ED Notes (Signed)
ED Provider at bedside. 

## 2020-11-07 NOTE — ED Provider Notes (Signed)
[facility] EMERGENCY DEPARTMENT Provider Note   CSN: [id number] Arrival date & time: 11/07/20  1530     History Chief Complaint  Patient presents with   Abdominal Pain    Vanessa Fernandez is a 87 y.o. female.  Patient is a 87 year old female with a history of GERD, recurrent a.flutter s/p ablation and chronic PE on eliquis, ongoing dysphagia, schlatzky ring and prior duodenitis, prior diverticulitis who is presenting today with complaint of abdominal pain.  Patient reports for the last 1 week she has had diarrhea with up to 6 diarrhea stools per day and she is generally just not felt quite herself.  However she took an Imodium on Friday and did not have any diarrhea yesterday but did not feel well with just general malaise.  Last night she had an episode of left upper quadrant abdominal pain that did resolve with Tums, famotidine.  When she woke up this morning the abdominal pain was not there but she still did not feel herself.  She did eat some eggs and toast for breakfast but felt very nauseated after it.  She then however was able to work in her garden and go to [employer].  Approximately 1 hour prior to arrival she developed sudden severe left upper quadrant pain that she stated was 8 or 9 out of 10.  She took Tums, famotidine and Carafate but reports the pain did not improve.  Spontaneously it has gradually gotten a little better and now the pain is a 6 out of 10.  It does not radiate.  Today she has not had a bowel movement and denies having any flatus.  She has had nausea but denies any vomiting.  No fever at all throughout the week.  No chest pain or shortness of breath.  She has had no medication changes except for mom changing her famotidine to a pantoprazole which she thought may have given her diarrhea so she went back to taking the famotidine.  She does not drink alcohol and does not have a history of narcotic use.  The history is provided by the patient and medical  records.  Abdominal Pain     Past Medical History:  Diagnosis Date   Depression    Diverticulitis    GERD (gastroesophageal reflux disease)    Glaucoma     Patient Active Problem List   Diagnosis Date Noted   Depression    Diverticulitis    GERD (gastroesophageal reflux disease)     Past Surgical History:  Procedure Laterality Date   JOINT REPLACEMENT       OB History   No obstetric history on file.     History reviewed. No pertinent family history.  Social History   Tobacco Use   Smoking status: Never  Substance Use Topics   Alcohol use: No   Drug use: No    Home Medications Prior to Admission medications   Medication Sig Start Date End Date Taking? Authorizing Provider  acetaminophen (TYLENOL) 500 MG tablet Take 1,000 mg by mouth every 6 (six) hours as needed. For pain     [provider]  clorazepate (TRANXENE) 7.5 MG tablet Take 7.5 mg by mouth at bedtime as needed. For sleep    [provider]  cromolyn (NASALCROM) 5.2 MG/ACT nasal spray Place 1 spray into the nose daily as needed. For congestion     [provider]  esomeprazole (NEXIUM) 40 MG capsule Take 40 mg by mouth every evening.  [provider]  HYDROcodone-acetaminophen (NORCO) 5-325 MG tablet Take 1-2 tablets by mouth every 4 (four) hours as needed for severe pain. 07/10/15   Loren Racer, MD  latanoprost (XALATAN) 0.005 % ophthalmic solution Place 1 drop into both eyes at bedtime.      [provider]  Multiple Vitamins-Minerals (MULTIVITAMINS THER. W/MINERALS) TABS Take 1 tablet by mouth daily.      [provider]  ondansetron (ZOFRAN ODT) 4 MG disintegrating tablet 4mg  ODT q4 hours prn nausea/vomit 07/10/15   09/07/15, MD  Probiotic Product (ALIGN PO) Take 1 tablet by mouth daily.      [provider]  saccharomyces boulardii (FLORASTOR) 250 MG capsule Take 250 mg by mouth daily.      [provider]   sertraline (ZOLOFT) 50 MG tablet Take 50 mg by mouth every morning.     [provider]  sucralfate (CARAFATE) 1 GM/10ML suspension Take 1 g by mouth 3 (three) times daily before meals. As needed For acid reflux     [provider]    Allergies    Aspirin, Bextra [valdecoxib], Celebrex [celecoxib], Codeine, Ibuprofen, Naproxen, and Sulfa antibiotics  Review of Systems   Review of Systems  Gastrointestinal:  Positive for abdominal pain.  All other systems reviewed and are negative.  Physical Exam Updated Vital Signs BP 120/86 (BP Location: Right Arm)   Pulse 78   Temp 97.7 F (36.5 C) (Oral)   Resp 20   Ht 5\' 4"  (1.626 m)   Wt 82.6 kg   SpO2 96%   BMI 31.24 kg/m   Physical Exam Vitals and nursing note reviewed.  Constitutional:      General: She is not in acute distress.    Appearance: Normal appearance. She is well-developed.  HENT:     Head: Normocephalic and atraumatic.     Mouth/Throat:     Mouth: Mucous membranes are moist.  Eyes:     Pupils: Pupils are equal, round, and reactive to light.  Cardiovascular:     Rate and Rhythm: Normal rate and regular rhythm.     Pulses: Normal pulses.     Heart sounds: Normal heart sounds. No murmur heard.   No friction rub.  Pulmonary:     Effort: Pulmonary effort is normal.     Breath sounds: Normal breath sounds. No wheezing or rales.  Abdominal:     General: Bowel sounds are normal. There is no distension.     Palpations: Abdomen is soft.     Tenderness: There is abdominal tenderness in the left upper quadrant. There is no right CVA tenderness, left CVA tenderness, guarding or rebound.     Hernia: No hernia is present.  Musculoskeletal:        General: No tenderness. Normal range of motion.     Cervical back: Normal range of motion and neck supple. No tenderness.     Right lower leg: No edema.     Left lower leg: No edema.     Comments: No edema  Skin:    General: Skin is warm and dry.      Findings: No rash.  Neurological:     Mental Status: She is alert and oriented to person, place, and time. Mental status is at baseline.     Cranial Nerves: No cranial nerve deficit.  Psychiatric:        Mood and Affect: Mood normal.        Behavior: Behavior normal.  Thought Content: Thought content normal.    ED Results / Procedures / Treatments   Labs (all labs ordered are listed, but only abnormal results are displayed) Labs Reviewed  COMPREHENSIVE METABOLIC PANEL - Abnormal; Notable for the following components:      Result Value   Glucose, Bld 107 (*)    AST 97 (*)    ALT 52 (*)    All other components within normal limits  CBC WITH DIFFERENTIAL/PLATELET  LIPASE, BLOOD  LACTIC ACID, PLASMA  URINALYSIS, ROUTINE W REFLEX MICROSCOPIC    EKG EKG Interpretation  Date/Time:  Sunday November 07 2020 15:45:50 EDT Ventricular Rate:  77 PR Interval:  164 QRS Duration: 83 QT Interval:  377 QTC Calculation: 427 R Axis:   -15 Text Interpretation: Sinus rhythm Borderline left axis deviation No significant change since last tracing Confirmed by Gwyneth Sprout ([id number]) on 11/07/2020 3:54:29 PM  Radiology CT ABDOMEN PELVIS WO CONTRAST  Result Date: 11/07/2020 CLINICAL DATA:  Left lower quadrant pain for several months. No fever. Evaluate for diverticulitis. EXAM: CT ABDOMEN AND PELVIS WITHOUT CONTRAST TECHNIQUE: Multidetector CT imaging of the abdomen and pelvis was performed following the standard protocol without IV contrast. COMPARISON:  January 29, 2020 FINDINGS: Lower chest: No acute abnormality. Hepatobiliary: Previous cholecystectomy with stable intra and extrahepatic biliary duct dilatation. No interval changes. Pancreas: Unremarkable. No pancreatic ductal dilatation or surrounding inflammatory changes. Spleen: Normal in size without focal abnormality. Adrenals/Urinary Tract: The kidneys are unremarkable. No ureterectasis. Mild wall thickening in the anterior bladder is  stable since September of 2021 and February 2017 imaging, of doubtful acute significance. Stomach/Bowel: Stomach and small bowel are normal. Numerous diverticuli are seen throughout the length of the colon with no evidence of diverticulitis on this study. There is moderate fecal loading in the colon. The colon is otherwise unremarkable. The appendix is not visualized but there is no secondary evidence of appendicitis. Vascular/Lymphatic: Calcified atherosclerosis is seen in the nonaneurysmal abdominal aorta, extending into the iliac vessels. No adenopathy. Reproductive: Status post hysterectomy. No adnexal masses. Other: No abdominal wall hernia or abnormality. No abdominopelvic ascites. Musculoskeletal: No acute or significant osseous findings. IMPRESSION: 1. No cause for the patient's symptoms identified. No diverticulitis. 2. Mild wall thickening associated with the anterior bladder is unchanged since February 2017, of no acute significance. 3. Moderate fecal loading throughout the colon. Diverticulosis without diverticulitis. The appendix is not visualized. 4. Calcified atherosclerosis in the abdominal aorta. Electronically Signed   By: Gerome Sam III M.D   On: 11/07/2020 17:12    Procedures Procedures   Medications Ordered in ED Medications  lactated ringers bolus 1,000 mL (has no administration in time range)  ondansetron (ZOFRAN) injection 4 mg (has no administration in time range)  fentaNYL (SUBLIMAZE) injection 12.5 mcg (has no administration in time range)    ED Course  I have reviewed the triage vital signs and the nursing notes.  Pertinent labs & imaging results that were available during my care of the patient were reviewed by me and considered in my medical decision making (see chart for details).    MDM Rules/Calculators/A&P                          Patient is an elderly 87 year old female who is high functioning and lives at home independently presenting today with left  upper quadrant abdominal pain.  Patient has had 1 week of diarrhea which did resolve after taking 1 Imodium on Friday  which did resolve after taking 1 Imodium on Friday but the left upper abdominal pain started yesterday and then has returned today and intensified.  She has had nausea without vomiting.  Eating seems to make it worse.  She denies any urinary symptoms and had her last normal bowel movement yesterday but has not noticed passing any flatus today.  She has had no prior abdominal surgeries.  Concern for bowel obstruction versus diverticulitis versus PUD with lower suspicion for pancreatitis, cholecystitis, hepatitis.  Do not suspect appendicitis and lower suspicion for renal stone or urinary tract pathology today.  Patient has eaten very little today and is still having 6 out of 10 pain.  She would like something for pain and nausea and was given fentanyl and Zofran.  Patient also given a bolus of fluid.  Labs and imaging are pending.  5:51 PM Patient's last EGD was October 2021 and at that time she was found to have a Schatzki's ring that was dilated and hyperemia in the gastric antrum and some in the duodenum but no frank ulcers.  Patient's labs today have normal CBC,'s lipase, lactate.  CMP is normal except for mild elevated LFTs, AST of 97 and ALT of 52.  Patient is status postcholecystectomy and CT shows no acute findings to explain patient's pain.  Low suspicion for mesenteric ischemia as patient has been taking her Eliquis regularly and does not have symptoms classic for mesenteric ischemia.  She is in a sinus rhythm today.  After fentanyl and a GI cocktail she reports her symptoms are improved.  However will monitor for the next hour p.o. challenge the patient but if she continues to have pain may need admitted for recurrent exams and possible recurrent EGD  6:34 PM On repeat evaluation after patient received a GI cocktail she reports that now her stomach feels much better.  She was able to  tolerate water and crackers.  She would like to go home.  Patient given return precautions.  Her neighbor is present there with her and patient is calling to have someone stay with her tonight.  MDM   Amount and/or Complexity of Data Reviewed Clinical lab tests: ordered and reviewed Tests in the radiology section of CPT: reviewed and ordered Tests in the medicine section of CPT: ordered and reviewed Independent visualization of images, tracings, or specimens: yes    Final Clinical Impression(s) / ED Diagnoses Final diagnoses:  Upper abdominal pain    Rx / DC Orders ED Discharge Orders          Ordered    GI Cocktail (alum & mag hydroxide-simethicone/lidocaine)oral mixture  2 times daily PRN       Note to Pharmacy: 200-200-20mg /135mL and 2% viscous lidocaine (15mL per dose)   11/07/20 1840             Gwyneth SproutPlunkett, Hilton Saephan, MD 11/07/20 1840

## 2021-11-07 ENCOUNTER — Emergency Department (HOSPITAL_BASED_OUTPATIENT_CLINIC_OR_DEPARTMENT_OTHER)
Admission: EM | Admit: 2021-11-07 | Discharge: 2021-11-07 | Disposition: A | Discharge status: Home / Self Care | Payer: Medicare Other | Attending: Emergency Medicine | Admitting: Emergency Medicine

## 2021-11-07 ENCOUNTER — Emergency Department (HOSPITAL_BASED_OUTPATIENT_CLINIC_OR_DEPARTMENT_OTHER): Payer: Medicare Other

## 2021-11-07 ENCOUNTER — Other Ambulatory Visit: Payer: Self-pay

## 2021-11-07 DIAGNOSIS — R110 Nausea: Secondary | ICD-10-CM | POA: Diagnosis not present

## 2021-11-07 DIAGNOSIS — R197 Diarrhea, unspecified: Secondary | ICD-10-CM | POA: Diagnosis not present

## 2021-11-07 DIAGNOSIS — R1032 Left lower quadrant pain: Secondary | ICD-10-CM | POA: Diagnosis present

## 2021-11-07 DIAGNOSIS — R1013 Epigastric pain: Secondary | ICD-10-CM | POA: Diagnosis not present

## 2021-11-07 DIAGNOSIS — R1011 Right upper quadrant pain: Secondary | ICD-10-CM | POA: Diagnosis not present

## 2021-11-07 LAB — COMPREHENSIVE METABOLIC PANEL
ALT: 19 U/L (ref 0–44)
AST: 30 U/L (ref 15–41)
Albumin: 4.1 g/dL (ref 3.5–5.0)
Alkaline Phosphatase: 65 U/L (ref 38–126)
Anion gap: 9 (ref 5–15)
BUN: 16 mg/dL (ref 8–23)
CO2: 24 mmol/L (ref 22–32)
Calcium: 9.1 mg/dL (ref 8.9–10.3)
Chloride: 105 mmol/L (ref 98–111)
Creatinine, Ser: 0.9 mg/dL (ref 0.44–1.00)
GFR, Estimated: 60 mL/min — ABNORMAL LOW (ref >60)
Glucose, Bld: 101 mg/dL — ABNORMAL HIGH (ref 70–99)
Potassium: 3.8 mmol/L (ref 3.5–5.1)
Sodium: 138 mmol/L (ref 135–145)
Total Bilirubin: 1.1 mg/dL (ref 0.3–1.2)
Total Protein: 7 g/dL (ref 6.5–8.1)

## 2021-11-07 LAB — URINALYSIS, ROUTINE W REFLEX MICROSCOPIC
Bilirubin Urine: NEGATIVE
Glucose, UA: NEGATIVE mg/dL
Hgb urine dipstick: NEGATIVE
Ketones, ur: NEGATIVE mg/dL
Leukocytes,Ua: NEGATIVE
Nitrite: NEGATIVE
Protein, ur: NEGATIVE mg/dL
Specific Gravity, Urine: 1.01 (ref 1.005–1.030)
pH: 5 (ref 5.0–8.0)

## 2021-11-07 LAB — CBC
HCT: 40.4 % (ref 36.0–46.0)
Hemoglobin: 13.8 g/dL (ref 12.0–15.0)
MCH: 31.7 pg (ref 26.0–34.0)
MCHC: 34.2 g/dL (ref 30.0–36.0)
MCV: 92.9 fL (ref 80.0–100.0)
Platelets: 272 10*3/uL (ref 150–400)
RBC: 4.35 MIL/uL (ref 3.87–5.11)
RDW: 13 % (ref 11.5–15.5)
WBC: 8.6 10*3/uL (ref 4.0–10.5)
nRBC: 0 % (ref 0.0–0.2)

## 2021-11-07 LAB — LIPASE, BLOOD: Lipase: 27 U/L (ref 11–51)

## 2021-11-07 LAB — TROPONIN I (HIGH SENSITIVITY): Troponin I (High Sensitivity): 4 ng/L (ref <18)

## 2021-11-07 MED ORDER — LIDOCAINE VISCOUS HCL 2 % MT SOLN
15.0000 mL | Freq: Once | OROMUCOSAL | Status: AC
  Administered 2021-11-07: 15 mL via ORAL
  Filled 2021-11-07: qty 15

## 2021-11-07 MED ORDER — ALUM & MAG HYDROXIDE-SIMETH 200-200-20 MG/5ML PO SUSP
30.0000 mL | Freq: Once | ORAL | Status: AC
  Administered 2021-11-07: 30 mL via ORAL
  Filled 2021-11-07: qty 30

## 2021-11-07 MED ORDER — SUCRALFATE 1 GM/10ML PO SUSP: 1.0000 g | Freq: Once | ORAL | Status: DC

## 2021-11-07 MED ORDER — IOHEXOL 300 MG/ML  SOLN
100.0000 mL | Freq: Once | INTRAMUSCULAR | Status: AC | PRN
  Administered 2021-11-07: 100 mL via INTRAVENOUS

## 2021-11-07 MED ORDER — OMEPRAZOLE 20 MG PO CPDR: 20.0000 mg | DELAYED_RELEASE_CAPSULE | Freq: Every day | ORAL | 0 refills | Status: DC

## 2021-11-07 NOTE — ED Provider Notes (Signed)
[facility] Provider Note   CSN: [id number] Arrival date & time: 11/07/21  1736     History PMH: GERD, diverticulitis  Chief Complaint  Patient presents with   Abdominal Pain    [facility] is a 88 y.o. female. Presents the emergency department abdominal pain.  She says last start Saturday she started noticing left lower quadrant abdominal pain.  This is pretty consistent with her prior cases of diverticulitis that she called her primary care doctor and they placed her on doxycycline.  She started doxycycline on Tuesday and has on day 3 now.  She says the left lower quadrant tenderness improved, but she is now having some epigastric pain that is wrapping around to her back on bilateral sides.  She says it is a burning sensation.  The pain is constant.  She has not had anything like this before.  She says it feels like something is squeezing her.  She has taken Carafate as well as Tums at home without any relief. She has associated nausea dn had one episode of diarrhea this morning. She called her PCP today and they recommended that she come to the emergency room for CT scan.  Patient denies any chest pain, shortness of breath, fevers, chills, dysuria, hematuria, constipation, or vomting.    Abdominal Pain Associated symptoms: diarrhea and nausea   Associated symptoms: no chest pain, no chills, no dysuria, no fever, no hematuria, no shortness of breath and no vomiting        Home Medications Prior to Admission medications   Medication Sig Start Date End Date Taking? Authorizing Provider  omeprazole (PRILOSEC) 20 MG capsule Take 1 capsule (20 mg total) by mouth daily. 11/07/21 12/07/21 Yes Vontrell Pullman, Adora Fridge, PA-C  acetaminophen (TYLENOL) 500 MG tablet Take 1,000 mg by mouth every 6 (six) hours as needed. For pain     [provider]  clorazepate (TRANXENE) 7.5 MG tablet Take 7.5 mg by mouth at bedtime as needed. For sleep    [provider]  cromolyn (NASALCROM) 5.2 MG/ACT nasal spray Place 1 spray into the nose daily as needed. For congestion     [provider]  esomeprazole (NEXIUM) 40 MG capsule Take 40 mg by mouth every evening.      [provider]  GI Cocktail (alum & mag hydroxide-simethicone/lidocaine)oral mixture Take 30 mLs by mouth 2 (two) times daily as needed. 11/07/20   Blanchie Dessert, MD  HYDROcodone-acetaminophen (NORCO) 5-325 MG tablet Take 1-2 tablets by mouth every 4 (four) hours as needed for severe pain. 07/10/15   Julianne Rice, MD  latanoprost (XALATAN) 0.005 % ophthalmic solution Place 1 drop into both eyes at bedtime.      [provider]  Multiple Vitamins-Minerals (MULTIVITAMINS THER. W/MINERALS) TABS Take 1 tablet by mouth daily.      [provider]  ondansetron (ZOFRAN ODT) 4 MG disintegrating tablet 4mg  ODT q4 hours prn nausea/vomit 07/10/15   Julianne Rice, MD  Probiotic Product (ALIGN PO) Take 1 tablet by mouth daily.      [provider]  saccharomyces boulardii (FLORASTOR) 250 MG capsule Take 250 mg by mouth daily.      [provider]  sertraline (ZOLOFT) 50 MG tablet Take 50 mg by mouth every morning.     [provider]  sucralfate (CARAFATE) 1 GM/10ML suspension Take 1 g by mouth 3 (three) times daily before meals. As needed For acid reflux     [provider]      Allergies    Aspirin, Bextra [valdecoxib], Celebrex [celecoxib], Codeine, Ibuprofen, Naproxen, and Sulfa antibiotics    Review of Systems   Review of Systems  Constitutional:  Negative for chills and fever.  Respiratory:  Negative for shortness of breath.   Cardiovascular:  Negative for chest pain.  Gastrointestinal:  Positive for abdominal pain, diarrhea and nausea. Negative for blood in stool and vomiting.  Genitourinary:  Positive for flank pain. Negative for dysuria and hematuria.  All other systems reviewed and are  negative.   Physical Exam Updated Vital Signs BP 116/79   Pulse 68   Temp 97.9 F (36.6 C) (Oral)   Resp 20   Ht 5\' 4"  (1.626 m)   Wt 83.9 kg   SpO2 95%   BMI 31.76 kg/m  Physical Exam Vitals and nursing note reviewed.  Constitutional:      General: She is not in acute distress.    Appearance: Normal appearance. She is not ill-appearing, toxic-appearing or diaphoretic.  HENT:     Head: Normocephalic and atraumatic.     Nose: No nasal deformity.     Mouth/Throat:     Lips: Pink. No lesions.     Mouth: Mucous membranes are moist. No injury, lacerations, oral lesions or angioedema.     Pharynx: Oropharynx is clear. Uvula midline. No pharyngeal swelling, oropharyngeal exudate, posterior oropharyngeal erythema or uvula swelling.  Eyes:     General: Gaze aligned appropriately. No scleral icterus.       Right eye: No discharge.        Left eye: No discharge.     Conjunctiva/sclera: Conjunctivae normal.     Right eye: Right conjunctiva is not injected. No exudate or hemorrhage.    Left eye: Left conjunctiva is not injected. No exudate or hemorrhage. Cardiovascular:     Rate and Rhythm: Normal rate and regular rhythm.     Pulses: Normal pulses.          Radial pulses are 2+ on the right side and 2+ on the left side.       Dorsalis pedis pulses are 2+ on the right side and 2+ on the left side.     Heart sounds: Normal heart sounds, S1 normal and S2 normal. Heart sounds not distant. No murmur heard.    No friction rub. No gallop. No S3 or S4 sounds.  Pulmonary:     Effort: Pulmonary effort is normal. No accessory muscle usage or respiratory distress.     Breath sounds: Normal breath sounds. No stridor. No wheezing, rhonchi or rales.  Chest:     Chest wall: No tenderness.  Abdominal:     General: Abdomen is flat. There is no distension.     Palpations: Abdomen is soft. There is no mass or pulsatile mass.     Tenderness: There is abdominal tenderness in the right upper quadrant,  epigastric area and left upper quadrant. There is right CVA tenderness and left CVA tenderness. There is no guarding or rebound. Negative signs include Murphy's sign, Rovsing's sign and McBurney's sign.     Hernia: No hernia is present.     Comments: Moderate epigastric, RUQ, and LUQ tenderness. Mild reproducible CVA tenderness  Musculoskeletal:     Right lower leg: No edema.     Left lower leg: No edema.  Skin:    General: Skin is warm and dry.     Coloration: Skin is not jaundiced or pale.  Findings: No bruising, erythema, lesion or rash.  Neurological:     General: No focal deficit present.     Mental Status: She is alert and oriented to person, place, and time.     GCS: GCS eye subscore is 4. GCS verbal subscore is 5. GCS motor subscore is 6.  Psychiatric:        Mood and Affect: Mood normal.        Behavior: Behavior normal. Behavior is cooperative.     ED Results / Procedures / Treatments   Labs (all labs ordered are listed, but only abnormal results are displayed) Labs Reviewed  COMPREHENSIVE METABOLIC PANEL - Abnormal; Notable for the following components:      Result Value   Glucose, Bld 101 (*)    GFR, Estimated 60 (*)    All other components within normal limits  LIPASE, BLOOD  CBC  URINALYSIS, ROUTINE W REFLEX MICROSCOPIC  TROPONIN I (HIGH SENSITIVITY)  TROPONIN I (HIGH SENSITIVITY)    EKG None  Radiology CT Abdomen Pelvis W Contrast  Result Date: 11/07/2021 CLINICAL DATA:  Abdominal pain, acute, nonlocalized EXAM: CT ABDOMEN AND PELVIS WITH CONTRAST TECHNIQUE: Multidetector CT imaging of the abdomen and pelvis was performed using the standard protocol following bolus administration of intravenous contrast. RADIATION DOSE REDUCTION: This exam was performed according to the departmental dose-optimization program which includes automated exposure control, adjustment of the mA and/or kV according to patient size and/or use of iterative reconstruction technique.  CONTRAST:  181mL OMNIPAQUE IOHEXOL 300 MG/ML  SOLN COMPARISON:  11/07/2020 FINDINGS: Lower chest: No acute abnormality Hepatobiliary: Prior cholecystectomy. Intrahepatic and extrahepatic biliary ductal dilatation likely related to patient's age and post cholecystectomy state. This is stable since prior study. Pancreas: No focal abnormality or ductal dilatation. Spleen: No focal abnormality.  Normal size. Adrenals/Urinary Tract: Small cysts in the right kidney. No stones or hydronephrosis. No suspicious renal or adrenal mass. Urinary bladder unremarkable. Stomach/Bowel: Colonic diverticulosis. No active diverticulitis. Stomach and small bowel decompressed, unremarkable. Vascular/Lymphatic: Aortic atherosclerosis. No evidence of aneurysm or adenopathy. Reproductive: Prior hysterectomy.  No adnexal masses. Other: No free fluid or free air. Musculoskeletal: No acute bony abnormality. IMPRESSION: No acute findings in the abdomen or pelvis. Ancillary findings as above. Electronically Signed   By: Rolm Baptise M.D.   On: 11/07/2021 20:08   DG Chest Portable 1 View  Result Date: 11/07/2021 CLINICAL DATA:  Atypical chest pain. EXAM: PORTABLE CHEST 1 VIEW COMPARISON:  April 13, 2021. FINDINGS: The heart size and mediastinal contours are within normal limits. Both lungs are clear. No visible pleural effusions or pneumothorax. The visualized skeletal structures are unremarkable. IMPRESSION: No active disease. Electronically Signed   By: Margaretha Sheffield M.D.   On: 11/07/2021 18:44    Procedures Procedures  This patient was on telemetry or cardiac monitoring during their time in the ED.    Medications Ordered in ED Medications  sucralfate (CARAFATE) 1 GM/10ML suspension 1 g (1 g Oral Not Given 11/07/21 2056)  alum & mag hydroxide-simeth (MAALOX/MYLANTA) 200-200-20 MG/5ML suspension 30 mL (30 mLs Oral Given 11/07/21 2001)    And  lidocaine (XYLOCAINE) 2 % viscous mouth solution 15 mL (15 mLs Oral Given 11/07/21  2001)  iohexol (OMNIPAQUE) 300 MG/ML solution 100 mL (100 mLs Intravenous Contrast Given 11/07/21 1935)    ED Course/ Medical Decision Making/ A&P  Medical Decision Making Amount and/or Complexity of Data Reviewed Labs: ordered. Radiology: ordered.  Risk OTC drugs. Prescription drug management.    MDM  This is a 88 y.o. female who presents to the ED with upper abdominal pain that radiates to the back in the setting of a recent flare of diverticulitis being on doxycycline for 3 days. The differential of this patient includes but is not limited to Aortic Dissection, ACS/MI, Gallbladder etiology (cholecystitis, cholangitis, biliary colic), GERD, Pancreatitis, PNA, PUD, and perforation.   My Impression, Plan, and ED Course:  Patient with stable vitals and afebrile.  She is in no acute distress.  She does have pretty significant abdominal pain on exam.  Specifically seems like a left lower quadrant mild pain has improved with antibiotics.  I suspect this is a different process going on.  Cardiac and abdominal labs ordered to start.  I personally ordered, reviewed, and interpreted all laboratory work and imaging and agree with radiologist interpretation. Results interpreted below:  CBC is normal.  CMP is also normal.  Troponin negative x1.  Urinalysis without any evidence of infection.  Lipase is 27.  Chest x-ray is without any acute abnormalities. EKG is nonischemic and normal sinus rhythm CT Abdomen pelvis is with no acute abnormalities to explain patient's symptom  Symptoms have improved with GI cocktail.  I discussed the results with patient and fairly unremarkable work-up.  Her repeat abdominal exam is reassuring.  She is on doxycycline which could cause abdominal pain as a side effect.  She has no evidence of diverticulitis, so I told her that she could stop taking this.  Her symptoms are also consistent with PUD.  I recommend restarting her PPI follow-up with  her primary care and gastroenterologist for further intervention.  Charting Requirements Additional history is obtained from:  Independent historian External Records from outside source obtained and reviewed including: today's office visit for left lower quadrant abdominal pain Social Determinants of Health:  none Pertinant PMH that complicates patient's illness: n/a  Patient Care Problems that were addressed during this visit: - Epigastric Pain: Acute illness with systemic symptoms This patient was maintained on a cardiac monitor/telemetry. I personally viewed and interpreted the cardiac monitor which reveals an underlying rhythm of NSR Medications given in ED: GI cocktail Reevaluation of the patient after these medicines showed that the patient improved I have reviewed home medications and made changes accordingly.  Critical Care Interventions: n/a Consultations: n/a Disposition: discharge  This is a supervised visit with my attending physician, Dr. Tyrone Nine. We have discussed this patient and they have altered the plan as needed.  Portions of this note were generated with [employer]. Dictation errors may occur despite best attempts at proofreading.    Final Clinical Impression(s) / ED Diagnoses Final diagnoses:  Epigastric pain    Rx / DC Orders ED Discharge Orders          Ordered    omeprazole (PRILOSEC) 20 MG capsule  Daily        11/07/21 2038              Sheila Oats 11/07/21 [place], [place], DO 11/07/21 2312

## 2021-11-07 NOTE — ED Triage Notes (Signed)
Pt reports having diverticulitis, on doxycycline x3 days.  Abdomina/ epigastric pain persists, radiates to back. Denies diarrhea.  Nausea today.

## 2021-11-07 NOTE — Discharge Instructions (Addendum)
Your CT today was normal. You do not have any evidence of Diverticulitis. You can stop taking Doxycycline which may be contributing to your abdominal pain. I also think you should start taking 40 mg Omeprazole once daily as this could be an ulcer or gastritis causing your symptoms. I recommend that you follow up with your PCP and GI provider for further management.

## 2022-03-27 ENCOUNTER — Encounter (HOSPITAL_COMMUNITY): Payer: Self-pay

## 2022-03-27 ENCOUNTER — Inpatient Hospital Stay (HOSPITAL_BASED_OUTPATIENT_CLINIC_OR_DEPARTMENT_OTHER)
Admission: EM | Admit: 2022-03-27 | Discharge: 2022-03-31 | DRG: 392 | Disposition: A | Discharge status: Home Health | Payer: Medicare Other | Attending: Internal Medicine | Admitting: Internal Medicine

## 2022-03-27 ENCOUNTER — Encounter (HOSPITAL_BASED_OUTPATIENT_CLINIC_OR_DEPARTMENT_OTHER): Payer: Self-pay | Admitting: Urology

## 2022-03-27 ENCOUNTER — Other Ambulatory Visit: Payer: Self-pay

## 2022-03-27 ENCOUNTER — Emergency Department (HOSPITAL_BASED_OUTPATIENT_CLINIC_OR_DEPARTMENT_OTHER): Payer: Medicare Other

## 2022-03-27 DIAGNOSIS — Z886 Allergy status to analgesic agent status: Secondary | ICD-10-CM | POA: Diagnosis not present

## 2022-03-27 DIAGNOSIS — E1165 Type 2 diabetes mellitus with hyperglycemia: Secondary | ICD-10-CM | POA: Diagnosis present

## 2022-03-27 DIAGNOSIS — K760 Fatty (change of) liver, not elsewhere classified: Secondary | ICD-10-CM | POA: Diagnosis not present

## 2022-03-27 DIAGNOSIS — Z79890 Hormone replacement therapy (postmenopausal): Secondary | ICD-10-CM

## 2022-03-27 DIAGNOSIS — H409 Unspecified glaucoma: Secondary | ICD-10-CM | POA: Diagnosis present

## 2022-03-27 DIAGNOSIS — R710 Precipitous drop in hematocrit: Secondary | ICD-10-CM | POA: Diagnosis present

## 2022-03-27 DIAGNOSIS — Z888 Allergy status to other drugs, medicaments and biological substances status: Secondary | ICD-10-CM | POA: Diagnosis not present

## 2022-03-27 DIAGNOSIS — Z6831 Body mass index (BMI) 31.0-31.9, adult: Secondary | ICD-10-CM

## 2022-03-27 DIAGNOSIS — Z79899 Other long term (current) drug therapy: Secondary | ICD-10-CM

## 2022-03-27 DIAGNOSIS — Z885 Allergy status to narcotic agent status: Secondary | ICD-10-CM | POA: Diagnosis not present

## 2022-03-27 DIAGNOSIS — E669 Obesity, unspecified: Secondary | ICD-10-CM | POA: Diagnosis present

## 2022-03-27 DIAGNOSIS — K5732 Diverticulitis of large intestine without perforation or abscess without bleeding: Secondary | ICD-10-CM | POA: Diagnosis present

## 2022-03-27 DIAGNOSIS — Z882 Allergy status to sulfonamides status: Secondary | ICD-10-CM

## 2022-03-27 DIAGNOSIS — Z7901 Long term (current) use of anticoagulants: Secondary | ICD-10-CM

## 2022-03-27 DIAGNOSIS — E039 Hypothyroidism, unspecified: Secondary | ICD-10-CM

## 2022-03-27 DIAGNOSIS — K219 Gastro-esophageal reflux disease without esophagitis: Secondary | ICD-10-CM | POA: Diagnosis not present

## 2022-03-27 DIAGNOSIS — I4891 Unspecified atrial fibrillation: Secondary | ICD-10-CM

## 2022-03-27 DIAGNOSIS — I480 Paroxysmal atrial fibrillation: Secondary | ICD-10-CM | POA: Diagnosis not present

## 2022-03-27 DIAGNOSIS — F32A Depression, unspecified: Secondary | ICD-10-CM | POA: Diagnosis present

## 2022-03-27 DIAGNOSIS — A0472 Enterocolitis due to Clostridium difficile, not specified as recurrent: Secondary | ICD-10-CM | POA: Diagnosis not present

## 2022-03-27 DIAGNOSIS — K5792 Diverticulitis of intestine, part unspecified, without perforation or abscess without bleeding: Principal | ICD-10-CM | POA: Diagnosis present

## 2022-03-27 HISTORY — DX: Disorder of thyroid, unspecified: E07.9

## 2022-03-27 LAB — COMPREHENSIVE METABOLIC PANEL
ALT: 18 U/L (ref 0–44)
AST: 28 U/L (ref 15–41)
Albumin: 3.9 g/dL (ref 3.5–5.0)
Alkaline Phosphatase: 75 U/L (ref 38–126)
Anion gap: 9 (ref 5–15)
BUN: 12 mg/dL (ref 8–23)
CO2: 24 mmol/L (ref 22–32)
Calcium: 8.6 mg/dL — ABNORMAL LOW (ref 8.9–10.3)
Chloride: 103 mmol/L (ref 98–111)
Creatinine, Ser: 0.84 mg/dL (ref 0.44–1.00)
GFR, Estimated: >60 mL/min (ref >60)
Glucose, Bld: 145 mg/dL — ABNORMAL HIGH (ref 70–99)
Potassium: 3.7 mmol/L (ref 3.5–5.1)
Sodium: 136 mmol/L (ref 135–145)
Total Bilirubin: 0.8 mg/dL (ref 0.3–1.2)
Total Protein: 6.6 g/dL (ref 6.5–8.1)

## 2022-03-27 LAB — URINALYSIS, ROUTINE W REFLEX MICROSCOPIC
Bilirubin Urine: NEGATIVE
Glucose, UA: NEGATIVE mg/dL
Hgb urine dipstick: NEGATIVE
Ketones, ur: NEGATIVE mg/dL
Leukocytes,Ua: NEGATIVE
Nitrite: NEGATIVE
Protein, ur: NEGATIVE mg/dL
Specific Gravity, Urine: 1.025 (ref 1.005–1.030)
pH: 5.5 (ref 5.0–8.0)

## 2022-03-27 LAB — CBC
HCT: 41.1 % (ref 36.0–46.0)
Hemoglobin: 13.4 g/dL (ref 12.0–15.0)
MCH: 30.1 pg (ref 26.0–34.0)
MCHC: 32.6 g/dL (ref 30.0–36.0)
MCV: 92.4 fL (ref 80.0–100.0)
Platelets: 251 10*3/uL (ref 150–400)
RBC: 4.45 MIL/uL (ref 3.87–5.11)
RDW: 12.9 % (ref 11.5–15.5)
WBC: 10.5 10*3/uL (ref 4.0–10.5)
nRBC: 0 % (ref 0.0–0.2)

## 2022-03-27 LAB — LIPASE, BLOOD: Lipase: 27 U/L (ref 11–51)

## 2022-03-27 MED ORDER — LEVOTHYROXINE SODIUM 112 MCG PO TABS
112.0000 ug | ORAL_TABLET | Freq: Every day | ORAL | Status: DC
  Administered 2022-03-28 – 2022-03-30 (×3): 112 ug via ORAL
  Filled 2022-03-27 (×3): qty 1

## 2022-03-27 MED ORDER — MORPHINE SULFATE (PF) 4 MG/ML IV SOLN
4.0000 mg | Freq: Once | INTRAVENOUS | Status: AC
  Administered 2022-03-27: 4 mg via INTRAVENOUS
  Filled 2022-03-27: qty 1

## 2022-03-27 MED ORDER — FAMOTIDINE IN NACL 20-0.9 MG/50ML-% IV SOLN
20.0000 mg | Freq: Once | INTRAVENOUS | Status: AC
  Administered 2022-03-27: 20 mg via INTRAVENOUS
  Filled 2022-03-27: qty 50

## 2022-03-27 MED ORDER — APIXABAN 2.5 MG PO TABS
2.5000 mg | ORAL_TABLET | Freq: Two times a day (BID) | ORAL | Status: DC
  Administered 2022-03-27 – 2022-03-31 (×8): 2.5 mg via ORAL
  Filled 2022-03-27 (×8): qty 1

## 2022-03-27 MED ORDER — ONDANSETRON HCL 4 MG/2ML IJ SOLN
4.0000 mg | Freq: Once | INTRAMUSCULAR | Status: AC
  Administered 2022-03-27: 4 mg via INTRAVENOUS

## 2022-03-27 MED ORDER — PROCHLORPERAZINE EDISYLATE 10 MG/2ML IJ SOLN
5.0000 mg | Freq: Four times a day (QID) | INTRAMUSCULAR | Status: DC | PRN
  Administered 2022-03-28 – 2022-03-30 (×2): 5 mg via INTRAVENOUS
  Filled 2022-03-27 (×2): qty 2

## 2022-03-27 MED ORDER — ONDANSETRON HCL 4 MG/2ML IJ SOLN
INTRAMUSCULAR | Status: AC
  Filled 2022-03-27: qty 2

## 2022-03-27 MED ORDER — VITAMIN B-12 100 MCG PO TABS
100.0000 ug | ORAL_TABLET | Freq: Every day | ORAL | Status: DC
  Administered 2022-03-28 – 2022-03-31 (×4): 100 ug via ORAL
  Filled 2022-03-27 (×4): qty 1

## 2022-03-27 MED ORDER — ALUM & MAG HYDROXIDE-SIMETH 200-200-20 MG/5ML PO SUSP
30.0000 mL | Freq: Once | ORAL | Status: AC
  Administered 2022-03-27: 30 mL via ORAL
  Filled 2022-03-27: qty 30

## 2022-03-27 MED ORDER — ONDANSETRON HCL 4 MG/2ML IJ SOLN
4.0000 mg | Freq: Once | INTRAMUSCULAR | Status: AC
  Administered 2022-03-27: 4 mg via INTRAVENOUS
  Filled 2022-03-27: qty 2

## 2022-03-27 MED ORDER — FLECAINIDE ACETATE 50 MG PO TABS
50.0000 mg | ORAL_TABLET | Freq: Every morning | ORAL | Status: DC
  Administered 2022-03-28 – 2022-03-31 (×4): 50 mg via ORAL
  Filled 2022-03-27 (×4): qty 1

## 2022-03-27 MED ORDER — VITAMIN B-12 100 MCG PO TABS: 100.0000 ug | ORAL_TABLET | Freq: Every day | ORAL | Status: DC

## 2022-03-27 MED ORDER — SODIUM CHLORIDE 0.9 % IV BOLUS
500.0000 mL | Freq: Once | INTRAVENOUS | Status: AC
  Administered 2022-03-27: 500 mL via INTRAVENOUS

## 2022-03-27 MED ORDER — PIPERACILLIN-TAZOBACTAM 3.375 G IVPB 30 MIN
3.3750 g | Freq: Once | INTRAVENOUS | Status: AC
  Administered 2022-03-27: 3.375 g via INTRAVENOUS
  Filled 2022-03-27: qty 50

## 2022-03-27 MED ORDER — LATANOPROST 0.005 % OP SOLN
1.0000 [drp] | Freq: Every day | OPHTHALMIC | Status: DC
  Administered 2022-03-29 – 2022-03-30 (×2): 1 [drp] via OPHTHALMIC
  Filled 2022-03-27: qty 2.5

## 2022-03-27 MED ORDER — FAMOTIDINE 20 MG PO TABS
20.0000 mg | ORAL_TABLET | Freq: Every day | ORAL | Status: DC
  Administered 2022-03-28 – 2022-03-31 (×4): 20 mg via ORAL
  Filled 2022-03-27 (×4): qty 1

## 2022-03-27 MED ORDER — FLECAINIDE ACETATE 50 MG PO TABS
25.0000 mg | ORAL_TABLET | Freq: Every day | ORAL | Status: DC
  Administered 2022-03-28 – 2022-03-30 (×3): 25 mg via ORAL
  Filled 2022-03-27 (×3): qty 1

## 2022-03-27 MED ORDER — ACETAMINOPHEN 325 MG PO TABS
650.0000 mg | ORAL_TABLET | Freq: Four times a day (QID) | ORAL | Status: DC | PRN
  Administered 2022-03-28 – 2022-03-30 (×4): 650 mg via ORAL
  Filled 2022-03-27 (×4): qty 2

## 2022-03-27 MED ORDER — HYDROMORPHONE HCL 1 MG/ML IJ SOLN: 0.5000 mg | INTRAMUSCULAR | Status: DC | PRN

## 2022-03-27 MED ORDER — PIPERACILLIN-TAZOBACTAM 3.375 G IVPB
3.3750 g | Freq: Three times a day (TID) | INTRAVENOUS | Status: DC
  Administered 2022-03-28 – 2022-03-31 (×10): 3.375 g via INTRAVENOUS
  Filled 2022-03-27 (×21): qty 50

## 2022-03-27 MED ORDER — IOHEXOL 300 MG/ML  SOLN
100.0000 mL | Freq: Once | INTRAMUSCULAR | Status: AC | PRN
Start: 2022-03-27 — End: 2022-03-27
  Administered 2022-03-27: 100 mL via INTRAVENOUS

## 2022-03-27 MED ORDER — OXYCODONE HCL 5 MG PO TABS: 5.0000 mg | ORAL_TABLET | Freq: Four times a day (QID) | ORAL | Status: DC | PRN

## 2022-03-27 NOTE — Plan of Care (Signed)
[facility] will assume care on arrival to accepting facility. Until arrival, care as per EDP. However, [facility] available 24/7 for questions and assistance.  Nursing staff, please page [facility] Admits and Consults ([phone]) as soon as the patient arrives to the hospital.   

## 2022-03-27 NOTE — ED Notes (Addendum)
Pt difficult stick. Difficulty obtaining IV access. EDP notified

## 2022-03-27 NOTE — ED Notes (Signed)
Pt provided with heat packs for abdomen

## 2022-03-27 NOTE — ED Triage Notes (Signed)
Left sided abdominal pain since yesterday  States "Feels like a football in my side"  Reports nausea but denies diarrhea or fever   H/o Diverticulitis

## 2022-03-27 NOTE — ED Provider Notes (Signed)
[facility] EMERGENCY DEPARTMENT Provider Note   CSN: [id number] Arrival date & time: 03/27/22  1441     History {Add pertinent medical, surgical, social history, OB history to HPI:1} Chief Complaint  Patient presents with   Abdominal Pain    [facility] is a 88 y.o. female.  She is here with a complaint of left lower abdominal pain that started last evening.  Associated with nausea.  No fever but has felt chilled.  Pain radiates into back rates it as 8 out of 10.  She said she feels like there is a football there.  Feels distended.  Had 2 normal bowel movements nonbloody.  No urinary symptoms.  She has had diverticulitis in the past and she said this may be similar.  Prior history of appendectomy cholecystectomy and hysterectomy.  The history is provided by the patient.  Abdominal Pain Pain location:  LLQ Pain quality: aching   Pain radiates to:  Back Pain severity:  Severe Onset quality:  Gradual Duration:  2 days Timing:  Constant Progression:  Worsening Context: not sick contacts and not trauma   Relieved by:  Nothing Worsened by:  Movement Ineffective treatments:  None tried Associated symptoms: chills and nausea   Associated symptoms: no chest pain, no constipation, no cough, no diarrhea, no dysuria, no fatigue, no fever, no hematemesis, no hematochezia, no hematuria, no shortness of breath and no vomiting        Home Medications Prior to Admission medications   Medication Sig Start Date End Date Taking? Authorizing Provider  acetaminophen (TYLENOL) 500 MG tablet Take 1,000 mg by mouth every 6 (six) hours as needed. For pain     [provider]  clorazepate (TRANXENE) 7.5 MG tablet Take 7.5 mg by mouth at bedtime as needed. For sleep    [provider]  cromolyn (NASALCROM) 5.2 MG/ACT nasal spray Place 1 spray into the nose daily as needed. For congestion     [provider]  esomeprazole (NEXIUM) 40 MG capsule Take 40 mg by  mouth every evening.      [provider]  GI Cocktail (alum & mag hydroxide-simethicone/lidocaine)oral mixture Take 30 mLs by mouth 2 (two) times daily as needed. 11/07/20   Blanchie Dessert, MD  HYDROcodone-acetaminophen (NORCO) 5-325 MG tablet Take 1-2 tablets by mouth every 4 (four) hours as needed for severe pain. 07/10/15   Julianne Rice, MD  latanoprost (XALATAN) 0.005 % ophthalmic solution Place 1 drop into both eyes at bedtime.      [provider]  Multiple Vitamins-Minerals (MULTIVITAMINS THER. W/MINERALS) TABS Take 1 tablet by mouth daily.      [provider]  omeprazole (PRILOSEC) 20 MG capsule Take 1 capsule (20 mg total) by mouth daily. 11/07/21 12/07/21  Loeffler, Adora Fridge, PA-C  ondansetron (ZOFRAN ODT) 4 MG disintegrating tablet 4mg  ODT q4 hours prn nausea/vomit 07/10/15   Julianne Rice, MD  Probiotic Product (ALIGN PO) Take 1 tablet by mouth daily.      [provider]  saccharomyces boulardii (FLORASTOR) 250 MG capsule Take 250 mg by mouth daily.      [provider]  sertraline (ZOLOFT) 50 MG tablet Take 50 mg by mouth every morning.     [provider]  sucralfate (CARAFATE) 1 GM/10ML suspension Take 1 g by mouth 3 (three) times daily before meals. As needed For acid reflux     [provider]      Allergies    Aspirin, Bextra [  valdecoxib], Celebrex [celecoxib], Codeine, Ibuprofen, Naproxen, and Sulfa antibiotics    Review of Systems   Review of Systems  Constitutional:  Positive for chills. Negative for fatigue and fever.  Respiratory:  Negative for cough and shortness of breath.   Cardiovascular:  Negative for chest pain.  Gastrointestinal:  Positive for abdominal pain and nausea. Negative for constipation, diarrhea, hematemesis, hematochezia and vomiting.  Genitourinary:  Negative for dysuria and hematuria.    Physical Exam Updated Vital Signs BP (!) 173/61 (BP Location: Right Arm)   Pulse 98    Temp 99.2 F (37.3 C) (Oral)   Resp 20   Ht 5\' 4"  (1.626 m)   Wt 83.9 kg   SpO2 93%   BMI 31.75 kg/m  Physical Exam Vitals and nursing note reviewed.  Constitutional:      General: She is not in acute distress.    Appearance: Normal appearance. She is well-developed.  HENT:     Head: Normocephalic and atraumatic.  Eyes:     Conjunctiva/sclera: Conjunctivae normal.  Cardiovascular:     Rate and Rhythm: Normal rate and regular rhythm.     Heart sounds: No murmur heard. Pulmonary:     Effort: Pulmonary effort is normal. No respiratory distress.     Breath sounds: Normal breath sounds.  Abdominal:     Palpations: Abdomen is soft.     Tenderness: There is abdominal tenderness in the left lower quadrant. There is no guarding or rebound.  Musculoskeletal:        General: Normal range of motion.     Cervical back: Neck supple.  Skin:    General: Skin is warm and dry.     Capillary Refill: Capillary refill takes less than 2 seconds.  Neurological:     General: No focal deficit present.     Mental Status: She is alert.     ED Results / Procedures / Treatments   Labs (all labs ordered are listed, but only abnormal results are displayed) Labs Reviewed  COMPREHENSIVE METABOLIC PANEL - Abnormal; Notable for the following components:      Result Value   Glucose, Bld 145 (*)    Calcium 8.6 (*)    All other components within normal limits  LIPASE, BLOOD  CBC  URINALYSIS, ROUTINE W REFLEX MICROSCOPIC    EKG None  Radiology No results found.  Procedures Procedures  {Document cardiac monitor, telemetry assessment procedure when appropriate:1}  Medications Ordered in ED Medications  sodium chloride 0.9 % bolus 500 mL (has no administration in time range)  ondansetron (ZOFRAN) injection 4 mg (has no administration in time range)  morphine (PF) 4 MG/ML injection 4 mg (has no administration in time range)    ED Course/ Medical Decision Making/ A&P                            Medical Decision Making Amount and/or Complexity of Data Reviewed Labs: ordered. Radiology: ordered.  Risk Prescription drug management.   This patient complains of ***; this involves an extensive number of treatment Options and is a complaint that carries with it a high risk of complications and morbidity. The differential includes ***  I ordered, reviewed and interpreted labs, which included *** I ordered medication *** and reviewed PMP when indicated. I ordered imaging studies which included *** and I independently    visualized and interpreted imaging which showed *** Additional history obtained from *** Previous records obtained and reviewed ***  I consulted *** and discussed lab and imaging findings and discussed disposition.  Cardiac monitoring reviewed, *** Social determinants considered, *** Critical Interventions: ***  After the interventions stated above, I reevaluated the patient and found *** Admission and further testing considered, ***   {Document critical care time when appropriate:1} {Document review of labs and clinical decision tools ie heart score, Chads2Vasc2 etc:1}  {Document your independent review of radiology images, and any outside records:1} {Document your discussion with family members, caretakers, and with consultants:1} {Document social determinants of health affecting pt's care:1} {Document your decision making why or why not admission, treatments were needed:1} Final Clinical Impression(s) / ED Diagnoses Final diagnoses:  None    Rx / DC Orders ED Discharge Orders     None

## 2022-03-28 DIAGNOSIS — Z6831 Body mass index (BMI) 31.0-31.9, adult: Secondary | ICD-10-CM | POA: Diagnosis not present

## 2022-03-28 DIAGNOSIS — R710 Precipitous drop in hematocrit: Secondary | ICD-10-CM | POA: Diagnosis present

## 2022-03-28 DIAGNOSIS — K5792 Diverticulitis of intestine, part unspecified, without perforation or abscess without bleeding: Secondary | ICD-10-CM

## 2022-03-28 DIAGNOSIS — E669 Obesity, unspecified: Secondary | ICD-10-CM | POA: Diagnosis present

## 2022-03-28 DIAGNOSIS — K760 Fatty (change of) liver, not elsewhere classified: Secondary | ICD-10-CM | POA: Diagnosis present

## 2022-03-28 DIAGNOSIS — E039 Hypothyroidism, unspecified: Secondary | ICD-10-CM | POA: Diagnosis present

## 2022-03-28 DIAGNOSIS — I480 Paroxysmal atrial fibrillation: Secondary | ICD-10-CM | POA: Diagnosis present

## 2022-03-28 DIAGNOSIS — Z79890 Hormone replacement therapy (postmenopausal): Secondary | ICD-10-CM | POA: Diagnosis not present

## 2022-03-28 DIAGNOSIS — Z7901 Long term (current) use of anticoagulants: Secondary | ICD-10-CM | POA: Diagnosis not present

## 2022-03-28 DIAGNOSIS — K5732 Diverticulitis of large intestine without perforation or abscess without bleeding: Secondary | ICD-10-CM | POA: Diagnosis present

## 2022-03-28 DIAGNOSIS — H409 Unspecified glaucoma: Secondary | ICD-10-CM | POA: Diagnosis present

## 2022-03-28 DIAGNOSIS — Z882 Allergy status to sulfonamides status: Secondary | ICD-10-CM | POA: Diagnosis not present

## 2022-03-28 DIAGNOSIS — K219 Gastro-esophageal reflux disease without esophagitis: Secondary | ICD-10-CM | POA: Diagnosis present

## 2022-03-28 DIAGNOSIS — I4891 Unspecified atrial fibrillation: Secondary | ICD-10-CM

## 2022-03-28 DIAGNOSIS — A0472 Enterocolitis due to Clostridium difficile, not specified as recurrent: Secondary | ICD-10-CM | POA: Diagnosis present

## 2022-03-28 DIAGNOSIS — E1165 Type 2 diabetes mellitus with hyperglycemia: Secondary | ICD-10-CM | POA: Diagnosis present

## 2022-03-28 DIAGNOSIS — Z888 Allergy status to other drugs, medicaments and biological substances status: Secondary | ICD-10-CM | POA: Diagnosis not present

## 2022-03-28 DIAGNOSIS — Z79899 Other long term (current) drug therapy: Secondary | ICD-10-CM | POA: Diagnosis not present

## 2022-03-28 DIAGNOSIS — Z885 Allergy status to narcotic agent status: Secondary | ICD-10-CM | POA: Diagnosis not present

## 2022-03-28 DIAGNOSIS — F32A Depression, unspecified: Secondary | ICD-10-CM | POA: Diagnosis present

## 2022-03-28 DIAGNOSIS — Z886 Allergy status to analgesic agent status: Secondary | ICD-10-CM | POA: Diagnosis not present

## 2022-03-28 LAB — COMPREHENSIVE METABOLIC PANEL
ALT: 79 U/L — ABNORMAL HIGH (ref 0–44)
AST: 120 U/L — ABNORMAL HIGH (ref 15–41)
Albumin: 3.2 g/dL — ABNORMAL LOW (ref 3.5–5.0)
Alkaline Phosphatase: 61 U/L (ref 38–126)
Anion gap: 6 (ref 5–15)
BUN: 11 mg/dL (ref 8–23)
CO2: 26 mmol/L (ref 22–32)
Calcium: 8 mg/dL — ABNORMAL LOW (ref 8.9–10.3)
Chloride: 107 mmol/L (ref 98–111)
Creatinine, Ser: 0.84 mg/dL (ref 0.44–1.00)
GFR, Estimated: >60 mL/min (ref >60)
Glucose, Bld: 108 mg/dL — ABNORMAL HIGH (ref 70–99)
Potassium: 3.7 mmol/L (ref 3.5–5.1)
Sodium: 139 mmol/L (ref 135–145)
Total Bilirubin: 1.1 mg/dL (ref 0.3–1.2)
Total Protein: 5.4 g/dL — ABNORMAL LOW (ref 6.5–8.1)

## 2022-03-28 LAB — CBC WITH DIFFERENTIAL/PLATELET
Abs Immature Granulocytes: 0.02 10*3/uL (ref 0.00–0.07)
Basophils Absolute: 0 10*3/uL (ref 0.0–0.1)
Basophils Relative: 0 %
Eosinophils Absolute: 0.1 10*3/uL (ref 0.0–0.5)
Eosinophils Relative: 1 %
HCT: 34.3 % — ABNORMAL LOW (ref 36.0–46.0)
Hemoglobin: 11.2 g/dL — ABNORMAL LOW (ref 12.0–15.0)
Immature Granulocytes: 0 %
Lymphocytes Relative: 24 %
Lymphs Abs: 1.9 10*3/uL (ref 0.7–4.0)
MCH: 30.6 pg (ref 26.0–34.0)
MCHC: 32.7 g/dL (ref 30.0–36.0)
MCV: 93.7 fL (ref 80.0–100.0)
Monocytes Absolute: 0.9 10*3/uL (ref 0.1–1.0)
Monocytes Relative: 11 %
Neutro Abs: 5 10*3/uL (ref 1.7–7.7)
Neutrophils Relative %: 64 %
Platelets: 179 10*3/uL (ref 150–400)
RBC: 3.66 MIL/uL — ABNORMAL LOW (ref 3.87–5.11)
RDW: 13 % (ref 11.5–15.5)
WBC: 7.9 10*3/uL (ref 4.0–10.5)
nRBC: 0 % (ref 0.0–0.2)

## 2022-03-28 LAB — HEMOGLOBIN A1C
Hgb A1c MFr Bld: 5.5 % (ref 4.8–5.6)
Mean Plasma Glucose: 111.15 mg/dL

## 2022-03-28 LAB — MAGNESIUM: Magnesium: 1.9 mg/dL (ref 1.7–2.4)

## 2022-03-28 LAB — PHOSPHORUS: Phosphorus: 3.2 mg/dL (ref 2.5–4.6)

## 2022-03-28 MED ORDER — POLYETHYLENE GLYCOL 3350 17 G PO PACK: 17.0000 g | PACK | Freq: Every day | ORAL | Status: DC | PRN

## 2022-03-28 MED ORDER — SODIUM CHLORIDE 0.9 % IV SOLN: INTRAVENOUS | Status: DC

## 2022-03-28 MED ORDER — INSULIN ASPART 100 UNIT/ML IJ SOLN: 0.0000 [IU] | INTRAMUSCULAR | Status: DC

## 2022-03-28 NOTE — Progress Notes (Signed)
[facility] Progress Note  [name]     GEZ:[id number]  DOA: 03/27/2022   PCP: Derrill Center., MD       Brief hospital course: This is a 88 year old female with atrial fibrillation and hypothyroidism who presents to the hospital for lower abdominal pain and nausea.  In the ED : Temperature was as high as 99.2 degrees She is found to have sigmoid diverticulitis on the CT scan.  She states that she has had an episode of diverticulitis in the past. She was started on Zosyn and IV fluids.  Subjective:  She states that she is no longer having abdominal pain but still is a little sore in her lower abdomen.  No complaints of nausea or vomiting.  No diarrhea.  Assessment and Plan: Principal Problem:   Acute diverticulitis -Continue Zosyn - Start a clear liquid diet - Continue IV fluids until tolerating a diet  Active Problems:   A-fib (HCC) -She takes flecainide and Eliquis at home which we are continuing  Elevated LFTs - Fatty liver noted on CT scan but no other findings - Follow-up LFTs tomorrow morning-as her pain and vomiting has resolved, I will not be ordering further work-up today   Hypothyroidism -Continue Synthroid      Code Status: Full Code DVT prophylaxis:  Eliquis  Consultants: none Level of Care: Level of care: Med-Surg Total time on patient care: 40 ming   Objective:   Vitals:   03/27/22 2000 03/27/22 2046 03/27/22 2333 03/28/22 0451  BP: (!) 118/43  (!) 106/39 (!) 94/51  Pulse: 86  70 61  Resp: 16  18 18   Temp:  98.1 F (36.7 C) 99 F (37.2 C) 98.5 F (36.9 C)  TempSrc:  Oral Oral Oral  SpO2: 99%  97% 98%  Weight:      Height:       Filed Weights   03/27/22 1459  Weight: 83.9 kg   Exam: General exam: Appears comfortable  HEENT: oral mucosa moist Respiratory system: Clear to auscultation.  Cardiovascular system: S1 & S2 heard  Gastrointestinal system: Abdomen soft, non-tender, nondistended. Normal bowel sounds    Extremities: No cyanosis, clubbing or edema Psychiatry:  Mood & affect appropriate.    Imaging and lab data was personally reviewed    CBC: Recent Labs  Lab 03/27/22 1503 03/28/22 0436  WBC 10.5 7.9  NEUTROABS  --  5.0  HGB 13.4 11.2*  HCT 41.1 34.3*  MCV 92.4 93.7  PLT 251 650   Basic Metabolic Panel: Recent Labs  Lab 03/27/22 1503 03/28/22 0436  NA 136 139  K 3.7 3.7  CL 103 107  CO2 24 26  GLUCOSE 145* 108*  BUN 12 11  CREATININE 0.84 0.84  CALCIUM 8.6* 8.0*  MG  --  1.9  PHOS  --  3.2   GFR: Estimated Creatinine Clearance: 42.9 mL/min (by C-G formula based on SCr of 0.84 mg/dL).  Scheduled Meds:  apixaban  2.5 mg Oral BID   famotidine  20 mg Oral Daily   flecainide  25 mg Oral QHS   flecainide  50 mg Oral q AM   latanoprost  1 drop Both Eyes QHS   levothyroxine  112 mcg Oral Q0600   cyanocobalamin  100 mcg Oral Daily   Continuous Infusions:  sodium chloride 50 mL/hr at 03/28/22 0258   piperacillin-tazobactam 3.375 g (03/28/22 0303)     LOS: 0 days   Author: Debbe Odea  03/28/2022 12:43 PM

## 2022-03-28 NOTE — Evaluation (Signed)
Physical Therapy Evaluation Patient Details Name: Vanessa Fernandez MRN: [id number] DOB: 12/06/1925 Today's Date: 03/28/2022  History of Present Illness  88 y.o. female admitted 03/27/22 to [facility] ED with complaints of left lower quadrant abdominal pain. CT findings of acute sigmoid diverticulitis without abscess or perforation. PMH includes chronic anxiety/depression, paroxysmal A-fib on Eliquis, obesity.  Clinical Impression  On eval, pt was Min guard A for mobility. She walked ~200 feet with a RW. Intermittent unsteadiness. Pt lives alone and uses a [facility] as needed (primarily outside). Son is able to check in on her as needed. Will recommend HHPT f/u to ensure safe transition back into her home environment (if pt is agreeable). Will continue to follow and mobilize as tolerated.        Recommendations for follow up therapy are one component of a multi-disciplinary discharge planning process, led by the attending physician.  Recommendations may be updated based on patient status, additional functional criteria and insurance authorization.  Follow Up Recommendations Home health PT (if pt is agreeable)      Assistance Recommended at Discharge Intermittent Supervision/Assistance  Patient can return home with the following  A little help with walking and/or transfers;A little help with bathing/dressing/bathroom;Assist for transportation;Help with stairs or ramp for entrance    Equipment Recommendations None recommended by PT  Recommendations for Other Services  OT consult    Functional Status Assessment Patient has had a recent decline in their functional status and demonstrates the ability to make significant improvements in function in a reasonable and predictable amount of time.     Precautions / Restrictions Precautions Precautions: Fall Restrictions Weight Bearing Restrictions: No      Mobility  Bed Mobility Overal bed mobility: Modified Independent             General bed  mobility comments: use of rail    Transfers Overall transfer level: Needs assistance Equipment used: Rolling walker (2 wheels) Transfers: Sit to/from Stand Sit to Stand: Min guard           General transfer comment: Min guard. Increased time. Some difficulty but no assist required    Ambulation/Gait Ambulation/Gait assistance: Min guard Gait Distance (Feet): 200 Feet Assistive device: Rolling walker (2 wheels) Gait Pattern/deviations: Step-through pattern, Decreased stride length, Trunk flexed       General Gait Details: Min guard for safety. Tolerated distance well.  Stairs            Wheelchair Mobility    Modified Rankin (Stroke Patients Only)       Balance Overall balance assessment: Needs assistance         Standing balance support: Bilateral upper extremity supported, During functional activity, Reliant on assistive device for balance Standing balance-Leahy Scale: Fair                               Pertinent Vitals/Pain Pain Assessment Pain Assessment: Faces Faces Pain Scale: Hurts little more Pain Location: LLQ abdomen Pain Descriptors / Indicators: Grimacing, Discomfort, Tender Pain Intervention(s): Limited activity within patient's tolerance, Monitored during session, Repositioned    Home Living Family/patient expects to be discharged to:: Private residence Living Arrangements: Alone Available Help at Discharge: Family;Available PRN/intermittently Type of Home: House Home Access: Stairs to enter Entrance Stairs-Rails: Right;Left;Can reach both Entrance Stairs-Number of Steps: 2   Home Layout: One level Home Equipment: Hand held shower head;Grab bars - tub/shower;Cane - single Barista (2 wheels) Additional Comments: does  not drive, retired from Editor, 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), enjoys getting out in the yard    Prior Function Prior Level of Function : Independent/Modified Independent             Mobility  Comments: uses SPC out in yard, no device inside home       Hand Dominance        Extremity/Trunk Assessment   Upper Extremity Assessment Upper Extremity Assessment: Defer to OT evaluation    Lower Extremity Assessment Lower Extremity Assessment: Generalized weakness    Cervical / Trunk Assessment Cervical / Trunk Assessment: Normal  Communication   Communication: HOH  Cognition Arousal/Alertness: Awake/alert Behavior During Therapy: WFL for tasks assessed/performed Overall Cognitive Status: Within Functional Limits for tasks assessed                                          General Comments      Exercises     Assessment/Plan    PT Assessment Patient needs continued PT services  PT Problem List Decreased strength;Decreased mobility;Decreased activity tolerance;Decreased balance;Decreased knowledge of use of DME;Pain       PT Treatment Interventions DME instruction;Gait training;Therapeutic activities;Therapeutic exercise;Patient/family education;Balance training;Functional mobility training    PT Goals (Current goals can be found in the Care Plan section)  Acute Rehab PT Goals Patient Stated Goal: home soon, less pain PT Goal Formulation: With patient/family Time For Goal Achievement: 04/11/22 Potential to Achieve Goals: Good    Frequency Min 3X/week     Co-evaluation               AM-PAC PT "6 Clicks" Mobility  Outcome Measure Help needed turning from your back to your side while in a flat bed without using bedrails?: None Help needed moving from lying on your back to sitting on the side of a flat bed without using bedrails?: None Help needed moving to and from a bed to a chair (including a wheelchair)?: A Little Help needed standing up from a chair using your arms (e.g., wheelchair or bedside chair)?: A Little Help needed to walk in hospital room?: A Little Help needed climbing 3-5 steps with a railing? : A Little 6 Click Score:  20    End of Session Equipment Utilized During Treatment: Gait belt Activity Tolerance: Patient tolerated treatment well Patient left: in bed;with call bell/phone within reach;with family/visitor present;with bed alarm set   PT Visit Diagnosis: Unsteadiness on feet (R26.81);Muscle weakness (generalized) (M62.81)    Time: [id number] PT Time Calculation (min) (ACUTE ONLY): 18 min   Charges:   PT Evaluation $PT Eval Low Complexity: [place], PT Acute Rehabilitation  Office: [phone]

## 2022-03-28 NOTE — Evaluation (Signed)
Occupational Therapy Evaluation Patient Details Name: Vanessa Fernandez MRN: [id number] DOB: 07/29/1925 Today's Date: 03/28/2022   History of Present Illness Vanessa Fernandez is a 88 y.o. female admitted 03/27/22 to [facility] ED with complaints of left lower quadrant abdominal pain. CT findings of acute sigmoid diverticulitis without abscess or perforation. PMH includes chronic anxiety/depression, paroxysmal A-fib on Eliquis, obesity.   Clinical Impression   Pt is typically mod I in home environment. Does her own cooking, has assist for cleaning. Ambulates without DME unless she is out in the yard and she uses a SPC. 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 Today she was able to ambulate to bathroom utilizing furniture for support but without DME, perform toilet transfer and peri care at sit to stand with min guard/supervision. She performed standing grooming at min guard, demonstrated LB dressing without assist. Pt initially on 2L O2 when OT entered the room. She was able to complete all ambulation, standing grooming, etc on RA. No SOB or DOE etc. When she sat down, SpO2 via finger pulse-ox read 85%, put on 2L and returned >90%. Via dynamap, SpO2 >90% on RA. Discussed with RN, left on RA at end of session. At this time Pt is performing at baseline (min guard due to line management) for ADL, edcuation provided for compensatory strategies for ADL with abdominal pain. Pt verbalized understanding. OT to sign off at this time.      Recommendations for follow up therapy are one component of a multi-disciplinary discharge planning process, led by the attending physician.  Recommendations may be updated based on patient status, additional functional criteria and insurance authorization.   Follow Up Recommendations  No OT follow up    Assistance Recommended at Discharge Intermittent Supervision/Assistance  Patient can return home with the following A little help with  bathing/dressing/bathroom;Assist for transportation;Help with stairs or ramp for entrance    Functional Status Assessment  Patient has had a recent decline in their functional status and demonstrates the ability to make significant improvements in function in a reasonable and predictable amount of time.  Equipment Recommendations  BSC/3in1    Recommendations for Other Services PT consult     Precautions / Restrictions Precautions Precautions: Fall Restrictions Weight Bearing Restrictions: No      Mobility Bed Mobility Overal bed mobility: Modified Independent             General bed mobility comments: use of rail    Transfers Overall transfer level: Needs assistance Equipment used: None Transfers: Sit to/from Stand Sit to Stand: Min guard           General transfer comment: elevated bed to be like home, min guard from lower surfaces "its these knee replacements, he put them in backwards - 15 years ago"      Balance Overall balance assessment: Mild deficits observed, not formally tested                                         ADL either performed or assessed with clinical judgement   ADL Overall ADL's : At baseline                                       General ADL Comments: a little slower due to abdominal pain,  but able to access LB via figure 4 compensatory strategy, demonstrate toilet transfer, standing peri care, standing grooming- supervision for line management only. Pt reports "furniture surfing" at baseline     Vision Baseline Vision/History: 1 Wears glasses Ability to See in Adequate Light: 0 Adequate Patient Visual Report: No change from baseline Vision Assessment?: No apparent visual deficits     Perception     Praxis      Pertinent Vitals/Pain Pain Assessment Pain Assessment: Faces Faces Pain Scale: Hurts even more Pain Location: LLQ abdomen Pain Descriptors / Indicators: Grimacing, Discomfort,  Tender Pain Intervention(s): Monitored during session, Limited activity within patient's tolerance, Repositioned     Hand Dominance Right   Extremity/Trunk Assessment Upper Extremity Assessment Upper Extremity Assessment: Overall WFL for tasks assessed   Lower Extremity Assessment Lower Extremity Assessment: Defer to PT evaluation (hx of Bil TKA (painful all the time))       Communication Communication Communication: No difficulties   Cognition Arousal/Alertness: Awake/alert Behavior During Therapy: WFL for tasks assessed/performed Overall Cognitive Status: Within Functional Limits for tasks assessed                                       General Comments  Pt is on RA typically. On my finger probe, SpO2 read 85% on RA after activity (clinically presenting WFL - no SOB, good color etc) when checked with dynamap, SPO2 >90% in seated position.    Exercises     Shoulder Instructions      Home Living Family/patient expects to be discharged to:: Private residence Living Arrangements: Alone Available Help at Discharge: Family;Available 24 hours/day;Available PRN/intermittently (son is retired) Type of Home: House Home Access: Stairs to enter Technical brewer of Steps: 2 Entrance Stairs-Rails: Right;Left;Can reach both (shorter than normal steps) Home Layout: One level     Bathroom Shower/Tub: Occupational psychologist: Handicapped height Bathroom Accessibility: Yes How Accessible: Accessible via walker Home Equipment: Hand held shower head;Grab bars - tub/shower;Cane - single point   Additional Comments: does not drive, retired from Editor, 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), enjoys getting out in the yard      Prior Functioning/Environment Prior Level of Function : Independent/Modified Independent             Mobility Comments: uses SPC out in yard          OT Problem List: Decreased range of motion;Decreased knowledge of use of DME or  AE      OT Treatment/Interventions:      OT Goals(Current goals can be found in the care plan section) Acute Rehab OT Goals Patient Stated Goal: decrease pain OT Goal Formulation: With patient Time For Goal Achievement: 04/11/22 Potential to Achieve Goals: Good  OT Frequency:      Co-evaluation              AM-PAC OT "6 Clicks" Daily Activity     Outcome Measure Help from another person eating meals?: Total (NPO) Help from another person taking care of personal grooming?: None Help from another person toileting, which includes using toliet, bedpan, or urinal?: None Help from another person bathing (including washing, rinsing, drying)?: A Little Help from another person to put on and taking off regular upper body clothing?: None Help from another person to put on and taking off regular lower body clothing?: A Little 6 Click Score: 19   End of  Session Equipment Utilized During Treatment: Gait belt (initially on 2L, left on RA) Nurse Communication: Mobility status;Other (comment) (SpO2)  Activity Tolerance: Patient tolerated treatment well Patient left: in chair;with call bell/phone within reach;with chair alarm set  OT Visit Diagnosis: Other abnormalities of gait and mobility (R26.89);Pain Pain - Right/Left: Left Pain - part of body:  (Quadrant of Abdomen)                Time: [id number] OT Time Calculation (min): 32 min Charges:  OT General Charges $OT Visit: 1 Visit OT Evaluation $OT Eval Low Complexity: Ko [place] OTR/L Acute Rehabilitation Services Office: [facility] 03/28/2022, 11:23 AM

## 2022-03-28 NOTE — Progress Notes (Signed)
Mobility Specialist - Progress Note    03/28/22 1234  Mobility  Activity Ambulated with assistance in hallway  Level of Assistance Standby assist, set-up cues, supervision of patient - no hands on  Assistive Device Front wheel walker  Distance Ambulated (ft) 175 ft  Activity Response Tolerated well  Mobility Referral Yes  $Mobility charge 1 Mobility   Pt received in recliner and agreeable to mobility. Pt normally uses a cane that sits in the closet, but suggested she use a walked today since she mentioned that she was a little weak &  was a little unsteady after standing up. Pt did require rocking back & forth to be able to stand. Pt had no complaints during mobility but did request pain medicine afterwards. Pt to bed after session with all needs met & son in room.    [facility]

## 2022-03-28 NOTE — H&P (Signed)
History and Physical  Vanessa Fernandez [id number] DOB: 22-Jun-1925 DOA: 03/27/2022  Referring physician: Admitted by Dr. Marlowe Sax, [facility].  PCP: Derrill Center., MD  Outpatient Specialists: Endocrinology, cardiology. Patient coming from: Home.  Chief Complaint: Abdominal pain.  HPI: Vanessa Fernandez is a 88 y.o. female with medical history significant for GERD, chronic anxiety/depression, paroxysmal A-fib on Eliquis, obesity, who initially presented to [facility] ED with complaints of left lower quadrant abdominal pain since the day prior to her presentation.  Associated with nausea without vomiting.  No reported subjective fevers or chills.  No diarrhea.  In the ED, work-up revealed CT findings of acute sigmoid diverticulitis without abscess or perforation.  She was started on empiric IV antibiotics Zosyn in the ED.  Received IV antiemetics, opiate-based analgesics and IV fluid hydration NS at 500 cc/h x 1.  TRH, hospitalist service, was asked to admit.  The patient was admitted by Dr. Marlowe Sax and transferred to [facility] long hospital MedSurg unit as observation status.  ED Course: Tmax 99.2.  BP 94/51, pulse 61, respiratory 18, O2 saturation 98% on 2 L.  Lab studies remarkable for serum glucose 145.  Hemoglobin 11.2.  Review of Systems: Review of systems as noted in the HPI. All other systems reviewed and are negative.   Past Medical History:  Diagnosis Date   Depression    Diverticulitis    GERD (gastroesophageal reflux disease)    Glaucoma    Thyroid disease    Past Surgical History:  Procedure Laterality Date   JOINT REPLACEMENT      Social History:  reports that she has never smoked. She does not have any smokeless tobacco history on file. She reports that she does not drink alcohol and does not use drugs.   Allergies  Allergen Reactions   Aspirin Other (See Comments)    Gi upset   Bextra [Valdecoxib] Other (See Comments)    GI upset    Celebrex [Celecoxib] Other (See  Comments)    Gi upset    Codeine Nausea And Vomiting   Ibuprofen Other (See Comments)    GI upset   Naproxen Other (See Comments)    Gi upset    Sulfa Antibiotics Other (See Comments)    "hands blister"    Family history: None reported  Prior to Admission medications   Medication Sig Start Date End Date Taking? Authorizing Provider  acetaminophen (TYLENOL) 500 MG tablet Take 1,000 mg by mouth every 6 (six) hours as needed. For pain    Yes [provider]  apixaban (ELIQUIS) 5 MG TABS tablet Take 2.5 mg by mouth 2 (two) times daily. 07/21/19  Yes [provider]  Cholecalciferol 125 MCG (5000 UT) TABS Take 1 tablet by mouth daily.   Yes [provider]  clorazepate (TRANXENE) 7.5 MG tablet Take 7.5 mg by mouth at bedtime as needed. For sleep   Yes [provider]  cromolyn (NASALCROM) 5.2 MG/ACT nasal spray Place 1 spray into the nose daily as needed. For congestion    Yes [provider]  cyanocobalamin 100 MCG tablet Take 100 mcg by mouth daily.   Yes [provider]  famotidine (PEPCID) 20 MG tablet Take 20 mg by mouth daily.   Yes [provider]  flecainide (TAMBOCOR) 50 MG tablet Take 50 mg by mouth in the morning.   Yes [provider]  flecainide (TAMBOCOR) 50 MG tablet Take 25 mg by mouth at bedtime. Pt takes 50 mg in the morning and  25 mg at night, per pt report.   Yes [provider]  latanoprost (XALATAN) 0.005 % ophthalmic solution Place 1 drop into both eyes at bedtime. 05/30/19  Yes [provider]  levothyroxine (SYNTHROID) 112 MCG tablet Take 112 mcg by mouth at bedtime. 12/29/19  Yes [provider]  magnesium oxide (MAG-OX) 400 MG tablet Take 400 mg by mouth daily.   Yes [provider]  Multiple Vitamin (MULTI-VITAMIN) tablet Take 1 tablet by mouth daily.   Yes [provider]  sucralfate (CARAFATE) 1 GM/10ML suspension Take 1 g by mouth 3 (three) times  daily before meals. As needed For acid reflux    Yes [provider]  esomeprazole (NEXIUM) 40 MG capsule Take 40 mg by mouth every evening.      [provider]  furosemide (LASIX) 20 MG tablet Take 20 mg by mouth 2 (two) times daily as needed for fluid. 12/10/19   [provider]  GI Cocktail (alum & mag hydroxide-simethicone/lidocaine)oral mixture Take 30 mLs by mouth 2 (two) times daily as needed. 11/07/20   Blanchie Dessert, MD  HYDROcodone-acetaminophen (NORCO) 5-325 MG tablet Take 1-2 tablets by mouth every 4 (four) hours as needed for severe pain. 07/10/15   Julianne Rice, MD  meclizine (ANTIVERT) 12.5 MG tablet Take 12.5 mg by mouth 3 (three) times daily as needed for dizziness. 02/22/20   [provider]  Multiple Vitamins-Minerals (MULTIVITAMINS THER. W/MINERALS) TABS Take 1 tablet by mouth daily.      [provider]  sertraline (ZOLOFT) 50 MG tablet Take 50 mg by mouth every morning.     [provider]    Physical Exam: BP (!) 106/39 (BP Location: Left Arm)   Pulse 70   Temp 99 F (37.2 C) (Oral)   Resp 18   Ht 5\' 4"  (1.626 m)   Wt 83.9 kg   SpO2 97%   BMI 31.75 kg/m   General: 88 y.o. year-old female well developed well nourished in no acute distress.  Alert and oriented x3. Cardiovascular: Regular rate and rhythm with no rubs or gallops.  No thyromegaly or JVD noted.  No lower extremity edema. 2/4 pulses in all 4 extremities. Respiratory: Clear to auscultation with no wheezes or rales. Good inspiratory effort. Abdomen: Soft left lower quadrant abdominal tenderness with palpation.  Nondistended with normal bowel sounds x4 quadrants. Muskuloskeletal: No cyanosis, clubbing or edema noted bilaterally Neuro: CN II-XII intact, strength, sensation, reflexes Skin: No ulcerative lesions noted or rashes Psychiatry: Judgement and insight appear normal. Mood is appropriate for condition and setting          Labs on Admission:   Basic Metabolic Panel: Recent Labs  Lab 03/27/22 1503  NA 136  K 3.7  CL 103  CO2 24  GLUCOSE 145*  BUN 12  CREATININE 0.84  CALCIUM 8.6*   Liver Function Tests: Recent Labs  Lab 03/27/22 1503  AST 28  ALT 18  ALKPHOS 75  BILITOT 0.8  PROT 6.6  ALBUMIN 3.9   Recent Labs  Lab 03/27/22 1503  LIPASE 27   No results for input(s): "AMMONIA" in the last 168 hours. CBC: Recent Labs  Lab 03/27/22 1503  WBC 10.5  HGB 13.4  HCT 41.1  MCV 92.4  PLT 251   Cardiac Enzymes: No results for input(s): "CKTOTAL", "CKMB", "CKMBINDEX", "TROPONINI" in the last 168 hours.  BNP (last 3 results) No results for input(s): "BNP" in the last 8760 hours.  ProBNP (last 3 results)  No results for input(s): "PROBNP" in the last 8760 hours.  CBG: No results for input(s): "GLUCAP" in the last 168 hours.  Radiological Exams on Admission: CT Abdomen Pelvis W Contrast  Result Date: 03/27/2022 CLINICAL DATA:  Left lower quadrant abdominal pain. EXAM: CT ABDOMEN AND PELVIS WITH CONTRAST TECHNIQUE: Multidetector CT imaging of the abdomen and pelvis was performed using the standard protocol following bolus administration of intravenous contrast. RADIATION DOSE REDUCTION: This exam was performed according to the departmental dose-optimization program which includes automated exposure control, adjustment of the mA and/or kV according to patient size and/or use of iterative reconstruction technique. CONTRAST:  149mL OMNIPAQUE IOHEXOL 300 MG/ML  SOLN COMPARISON:  CT abdomen pelvis dated 11/07/2021. FINDINGS: Lower chest: The visualized lung bases are clear. There is coronary vascular calcification. No intra-abdominal free air or free fluid. Hepatobiliary: Mild fatty liver. There is dilatation of the biliary trees, likely post cholecystectomy. No retained calcified stone noted in the central CBD. Pancreas: Unremarkable. No pancreatic ductal dilatation or surrounding inflammatory changes. Spleen: Normal in  size without focal abnormality. Adrenals/Urinary Tract: The adrenal glands unremarkable. Subcentimeter right renal hypodense lesions are too small to characterize. There is no hydronephrosis on either side. There is symmetric enhancement and excretion of contrast by both kidneys. The visualized ureters and urinary bladder appear unremarkable. Stomach/Bowel: There is sigmoid diverticulosis. There is inflammatory changes and stranding adjacent to a sigmoid diverticula in the distal descending/sigmoid junction consistent with acute diverticulitis. No diverticular abscess or perforation. There is loose stool throughout the colon. There is no bowel obstruction. The appendix is not visualized with certainty. No inflammatory changes identified in the right lower quadrant. Vascular/Lymphatic: Moderate aortoiliac atherosclerotic disease. The IVC is unremarkable. No portal venous gas. There is no adenopathy. Reproductive: Hysterectomy.  No adnexal masses. Other: Midline vertical anterior pelvic wall incisional scar. Musculoskeletal: Osteopenia with degenerative changes of the spine. No acute osseous pathology. IMPRESSION: 1. Acute sigmoid diverticulitis. No diverticular abscess or perforation. 2. No bowel obstruction. 3. Mild fatty liver. 4.  Aortic Atherosclerosis (ICD10-I70.0). Electronically Signed   By: Anner Crete M.D.   On: 03/27/2022 19:08    EKG: I independently viewed the EKG done and my findings are as followed: None available at the time of this dictation.  Assessment/Plan Present on Admission:  Acute diverticulitis  Principal Problem:   Acute diverticulitis  Acute sigmoid diverticulitis, without perforation or abscess, POA Continue Zosyn started in the ED. Continue IV fluid Supportive care, IV antiemetics, pain control. N.p.o. for now except ice chips and sips with meds until pain is improved.  Intractable nausea in the setting of acute hemodynamic colitis IV antiemetics as needed IV fluid  hydration  Type 2 diabetes with hyperglycemia Obtain hemoglobin A1c Start insulin sliding scale every 4 hours while NPO.  Paroxysmal A-fib on Eliquis Rate controlled Resume home Eliquis at home flecainide  Hypothyroidism Resume home levothyroxine  GERD Resume home H2 blocker  Physical debility PT OT to assess Fall precautions.   DVT prophylaxis: Home Eliquis  Code Status: Full code  Family Communication: None at bedside  Disposition Plan: Admitted to MedSurg unit  Consults called: None.  Admission status: Observation status.   Status is: Observation    Kayleen Memos MD [facility] Pager [phone]  If 7PM-7AM, please contact night-coverage [email or web address] Password [email or web address]  03/28/2022, 1:06 AM

## 2022-03-29 ENCOUNTER — Other Ambulatory Visit (HOSPITAL_COMMUNITY): Payer: Self-pay

## 2022-03-29 DIAGNOSIS — K5792 Diverticulitis of intestine, part unspecified, without perforation or abscess without bleeding: Secondary | ICD-10-CM | POA: Diagnosis not present

## 2022-03-29 LAB — COMPREHENSIVE METABOLIC PANEL
ALT: 51 U/L — ABNORMAL HIGH (ref 0–44)
AST: 49 U/L — ABNORMAL HIGH (ref 15–41)
Albumin: 3.2 g/dL — ABNORMAL LOW (ref 3.5–5.0)
Alkaline Phosphatase: 67 U/L (ref 38–126)
Anion gap: 4 — ABNORMAL LOW (ref 5–15)
BUN: 10 mg/dL (ref 8–23)
CO2: 26 mmol/L (ref 22–32)
Calcium: 8 mg/dL — ABNORMAL LOW (ref 8.9–10.3)
Chloride: 111 mmol/L (ref 98–111)
Creatinine, Ser: 0.85 mg/dL (ref 0.44–1.00)
GFR, Estimated: >60 mL/min (ref >60)
Glucose, Bld: 99 mg/dL (ref 70–99)
Potassium: 4 mmol/L (ref 3.5–5.1)
Sodium: 141 mmol/L (ref 135–145)
Total Bilirubin: 0.9 mg/dL (ref 0.3–1.2)
Total Protein: 5.6 g/dL — ABNORMAL LOW (ref 6.5–8.1)

## 2022-03-29 LAB — C DIFFICILE QUICK SCREEN W PCR REFLEX
C Diff antigen: POSITIVE — AB
C Diff toxin: NEGATIVE

## 2022-03-29 LAB — CLOSTRIDIUM DIFFICILE BY PCR, REFLEXED: Toxigenic C. Difficile by PCR: POSITIVE — AB

## 2022-03-29 MED ORDER — CALCIUM CARBONATE ANTACID 500 MG PO CHEW
1.0000 | CHEWABLE_TABLET | Freq: Three times a day (TID) | ORAL | Status: DC | PRN
  Administered 2022-03-30 – 2022-03-31 (×2): 200 mg via ORAL
  Filled 2022-03-29 (×3): qty 1

## 2022-03-29 MED ORDER — SODIUM CHLORIDE 0.9 % IV BOLUS
250.0000 mL | Freq: Once | INTRAVENOUS | Status: AC
  Administered 2022-03-29: 250 mL via INTRAVENOUS

## 2022-03-29 NOTE — Progress Notes (Signed)
Mobility Specialist - Progress Note   03/29/22 1638  Mobility  Activity Ambulated with assistance in hallway  Level of Assistance Independent after set-up  Assistive Device  (IV Pole)  Distance Ambulated (ft) 500 ft  Activity Response Tolerated well  Mobility Referral Yes  $Mobility charge 1 Mobility   Pt received in recliner and agreeable to mobility. No complaints during mobility. Pt to recliner after session with all needs met.      [facility]

## 2022-03-29 NOTE — Progress Notes (Signed)
Mobility Specialist - Progress Note   03/29/22 1136  Mobility  Activity Ambulated with assistance in hallway  Level of Assistance Modified independent, requires aide device or extra time  Assistive Device Front wheel walker  Distance Ambulated (ft) 500 ft  Activity Response Tolerated well  Mobility Referral Yes  $Mobility charge 1 Mobility   Pt received in recline w/ son in room and agreeable to mobility. No complaints during mobility. Pt to recliner after session with all needs met.    [facility]

## 2022-03-29 NOTE — TOC Initial Note (Signed)
Transition of Care [facility]) - Initial/Assessment Note    Patient Details  Name: Vanessa Fernandez MRN: [id number] Date of Birth: 27-Feb-1926  Transition of Care [facility]) CM/SW Contact:    Henrietta Dine, RN Phone Number: 03/29/2022, 10:12 AM  Clinical Narrative:                 [facility] consult for HHPT; spoke with pt and she is agreeable to [place]; she does not have an agency preference; pt notified contact information for HHPT agency will be placed in follow up provider section of d/c instructions; the pt verifies her POC Audrionna Lampton, son, [phone]; she is from home alone and does not have [place] services; the pt says she uses a cane or walking stick at home; the pt says she has a walker and  bars in her shower; the pt says she has no food insecurities or problems getting her medications; the pt also says she has transportation home and to her appts; spoke with Tommi Rumps at [facility] and he says the agency can provide HHPT services for the pt; TOC will con't to follow.  Expected Discharge Plan: [place] Barriers to Discharge: Continued Medical Work up   Patient Goals and CMS Choice        Expected Discharge Plan and Services Expected Discharge Plan: [place]   Discharge Planning Services: CM Consult   Living arrangements for the past 2 months: Single Family Home                           HH Arranged: PT HH Agency: [place] Date [place]: 03/29/22 Time HH Agency Contacted: 1011 Representative spoke with at [place]: [place] Arrangements/Services Living arrangements for the past 2 months: [place] Lives with:: Self Patient language and need for interpreter reviewed:: Yes Do you feel safe going back to the place where you live?: Yes      Need for Family Participation in Patient Care: Yes (Comment) Care giver support system in place?: Yes (comment)   Criminal Activity/Legal Involvement Pertinent to  Current Situation/Hospitalization: No - Comment as needed  Activities of Daily Living Home Assistive Devices/Equipment: Eyeglasses, Cane (specify quad or straight) ADL Screening (condition at time of admission) Patient's cognitive ability adequate to safely complete daily activities?: Yes Is the patient deaf or have difficulty hearing?: No Does the patient have difficulty seeing, even when wearing glasses/contacts?: No Does the patient have difficulty concentrating, remembering, or making decisions?: No Patient able to express need for assistance with ADLs?: Yes Does the patient have difficulty dressing or bathing?: No Independently performs ADLs?: Yes (appropriate for developmental age) Does the patient have difficulty walking or climbing stairs?: No Weakness of Legs: None Weakness of Arms/Hands: None  Permission Sought/Granted Permission sought to share information with : Case Manager Permission granted to share information with : Yes, Verbal Permission Granted  Share Information with NAME: Lenor Coffin, RN, CM           Emotional Assessment Appearance:: Appears stated age Attitude/Demeanor/Rapport: Gracious Affect (typically observed): Accepting Orientation: : Oriented to Self, Oriented to Place, Oriented to  Time, Oriented to Situation Alcohol / Substance Use: Not Applicable Psych Involvement: No (comment)  Admission diagnosis:  Acute diverticulitis [K57.92] Diverticulitis [K57.92] Patient Active Problem List   Diagnosis Date Noted   A-fib [facility]) 03/28/2022   Hypothyroidism 03/28/2022   Acute diverticulitis 03/27/2022  Depression    Diverticulitis    GERD (gastroesophageal reflux disease)    PCP:  Derrill Center., MD Pharmacy:   [facility] [phone] - [place], [place] - [address] AT [place] [address] [place] [address] Phone: [phone] Fax: [place] [phone] - [place], [place] [address] Phone: [phone] Fax: [phone]     Social Determinants of Health (SDOH) Interventions    Readmission Risk Interventions    03/29/2022   10:10 AM  Readmission Risk Prevention Plan  Transportation Screening Complete  PCP or Specialist Appt within 5-7 Days Complete  Home Care Screening Complete  Medication Review (RN CM) Complete

## 2022-03-29 NOTE — Hospital Course (Addendum)
66 old female with GERD chronic anxiety/depression, PAF on Eliquis, class I obesity admitted with working diagnosis of acute sigmoid diverticulitis without perforation  She presented to ED 10/31 with LLQ abdominal pain 1 day PTA with nausea. In the ED CT abdomen-acute sigmoid diverticulitis without abscess or perforation placed on IV Zosyn antiemetics pain management IV fluids, and admitted Patient was having diarrhea got tested positive for antigen toxin negative blood PCR toxigenic C. difficile positive discussed with Dr. Johnnye Sima placed on C. difficile treatment. Left lower quadrant abdominal pain improving on antibiotics, diet advanced and tolerating Having reflux epigastric pain placed on Protonix, she takes Carafate NORMALLY Placed on Carafate Pepcid PPI.  Seen by GI at this time okay for discharge home and follow-up with gastroenterology as outpatient continue C Dif treatment and Diverticulitis treatment

## 2022-03-29 NOTE — Progress Notes (Signed)
PROGRESS NOTE Vanessa Fernandez  ZOX:[id number] DOB: 02/17/1926 DOA: 03/27/2022 PCP: Derrill Center., MD   Brief Narrative/Hospital Course: 43 old female with GERD chronic anxiety/depression, PAF on Eliquis, class I obesity admitted with working diagnosis of acute sigmoid diverticulitis without perforation  She presented to ED 10/31 with LLQ abdominal pain 1 day PTA with nausea. In the ED CT abdomen-acute sigmoid diverticulitis without abscess or perforation placed on IV Zosyn antiemetics pain management IV fluids, and admitted    Subjective: Seen and examined this morning, resting comfortably bedside chair.  No nausea or vomiting.  Complains of left lower quadrant abdominal pain Overnight no diarrhea, heart rate in 50s to 60s saturating well on room air, no fever BP has been soft especially diastolic since admission   Assessment and Plan: Principal Problem:   Acute diverticulitis Active Problems:   Diverticulitis   A-fib ([place])   Hypothyroidism   Acute uncomplicated sigmoid diverticulitis: On Zosyn, clear liquid diet IVF antiemetics pain control and clinically improving WBC count remained stable afebrile.  Advance to full liquid diet, continue gentle IV fluid hydration given her soft blood pressure.  Followed by Dr. [facility] and advised to contact upon discharge for colonoscopy patient reports he can have the colonoscopy done after Dr. Shana Chute could not complete it.  If persistent abdominal pain will ask GI to see her  Diarrhea-check C. Difficile/enteric precaution  Soft diastolic BP given IV hydration-try small bolus and increase NS to 125 cc/hr as suspect it is 2/2 dehydration with diverticulitis.  Elevated LFTs fatty liver noted on CT, trend LFTs Recent Labs  Lab 03/27/22 1503 03/28/22 0436 03/29/22 0742  AST 28 120* 49*  ALT 18 79* 51*  ALKPHOS 75 61 67  BILITOT 0.8 1.1 0.9  PROT 6.6 5.4* 5.6*  ALBUMIN 3.9 3.2* 3.2*    PAF on Eliquis, flecainide.  Heart  rate is stable.  Monitor  Hypothyroidism on Synthroid, continue the same  Mild drop in hemoglobin unclear etiology-possibly hemodilution.  Monitor.   Recent Labs    11/07/21 1830 03/27/22 1503 03/28/22 0436  HGB 13.8 13.4 11.2*  MCV 92.9 92.4 93.7    Deconditioning debility PT OT recommending home health  Class I Obesity:Patient's Body mass index is 31.75 kg/m. : Will benefit with PCP follow-up, weight loss  healthy lifestyle and outpatient sleep evaluation.  DVT prophylaxis: apixaban (ELIQUIS) tablet 2.5 mg Start: 03/27/22 2200 Code Status:   Code Status: Full Code Family Communication: plan of care discussed with patient/son at bedside. Patient status is: Inpatient because of diverticulitis Level of care: Med-Surg   Dispo: The patient is from: Home.Current activity level is 5            Anticipated disposition: Home in next 1 to 2 days  Objective: Vitals last 24 hrs: Vitals:   03/28/22 2101 03/29/22 0629 03/29/22 1113 03/29/22 1318  BP: (!) 114/49 (!) 112/40 (!) 117/46 (!) 148/65  Pulse: (!) 58 (!) 56  (!) 102  Resp: 16 18  18   Temp: 98.3 F (36.8 C) 97.8 F (36.6 C)  98.4 F (36.9 C)  TempSrc: Oral Oral  Oral  SpO2: 95% 97%  97%  Weight:      Height:       Weight change:   Physical Examination: General exam:Alert awake, older than stated age HEENT:Oral mucosa moist, Ear/Nose WNL grossly Respiratory system:Bilaterally clear BS, no use of accessory muscle Cardiovascular system:S1 & S2 +, No JVD. Gastrointestinal system:Abdomen soft, LLQ is tender, ND,  BS+ Nervous System:Alert, awake, moving extremities. Extremities: LE edema neg,distal peripheral pulses palpable.  Skin: No rashes,no icterus. MSK: Normal muscle bulk,tone, power  Medications reviewed:  Scheduled Meds:  apixaban  2.5 mg Oral BID   famotidine  20 mg Oral Daily   flecainide  25 mg Oral QHS   flecainide  50 mg Oral q AM   latanoprost  1 drop Both Eyes QHS   levothyroxine  112 mcg Oral Q0600    cyanocobalamin  100 mcg Oral Daily   Continuous Infusions:  sodium chloride 125 mL/hr at 03/29/22 0905   piperacillin-tazobactam 3.375 g (03/29/22 1335)    Diet Order             Diet full liquid Room service appropriate? Yes; Fluid consistency: Thin  Diet effective now                  Intake/Output Summary (Last 24 hours) at 03/29/2022 1429 Last data filed at 03/29/2022 0600 Gross per 24 hour  Intake 1760 ml  Output --  Net 1760 ml   Net IO Since Admission: 3,437.71 mL [03/29/22 1429]  Wt Readings from Last 3 Encounters:  03/27/22 83.9 kg  11/07/21 83.9 kg  11/07/20 82.6 kg     Unresulted Labs (From admission, onward)     Start     Ordered   03/30/22 0500  Comprehensive metabolic panel  Daily at 5am,   R      03/29/22 0747   03/30/22 0500  CBC  Daily at 5am,   R      03/29/22 0747   03/29/22 1121  C. Diff by PCR, Reflexed  Once,   TIMED        03/29/22 1121          Data Reviewed: I have personally reviewed following labs and imaging studies CBC: Recent Labs  Lab 03/27/22 1503 03/28/22 0436  WBC 10.5 7.9  NEUTROABS  --  5.0  HGB 13.4 11.2*  HCT 41.1 34.3*  MCV 92.4 93.7  PLT 251 0000000   Basic Metabolic Panel: Recent Labs  Lab 03/27/22 1503 03/28/22 0436 03/29/22 0742  NA 136 139 141  K 3.7 3.7 4.0  CL 103 107 111  CO2 24 26 26   GLUCOSE 145* 108* 99  BUN 12 11 10   CREATININE 0.84 0.84 0.85  CALCIUM 8.6* 8.0* 8.0*  MG  --  1.9  --   PHOS  --  3.2  --    GFR: Estimated Creatinine Clearance: 42.4 mL/min (by C-G formula based on SCr of 0.85 mg/dL). Liver Function Tests: Recent Labs  Lab 03/27/22 1503 03/28/22 0436 03/29/22 0742  AST 28 120* 49*  ALT 18 79* 51*  ALKPHOS 75 61 67  BILITOT 0.8 1.1 0.9  PROT 6.6 5.4* 5.6*  ALBUMIN 3.9 3.2* 3.2*   Recent Labs  Lab 03/27/22 1503  LIPASE 27   No results for input(s): "AMMONIA" in the last 168 hours. Coagulation Profile: No results for input(s): "INR", "PROTIME" in the last 168  hours. BNP (last 3 results) No results for input(s): "PROBNP" in the last 8760 hours. HbA1C: Recent Labs    03/28/22 0436  HGBA1C 5.5  No results for input(s): "PROCALCITON", "LATICACIDVEN" in the last 168 hours.  Recent Results (from the past 240 hour(s))  C Difficile Quick Screen w PCR reflex     Status: Abnormal   Collection Time: 03/29/22 11:21 AM   Specimen: STOOL  Result Value Ref Range Status  C Diff antigen POSITIVE (A) NEGATIVE Final   C Diff toxin NEGATIVE NEGATIVE Final   C Diff interpretation Results are indeterminate. See PCR results.  Final    Comment: Performed at [facility], [place] [address], [place], [place] [address]    Antimicrobials: Anti-infectives (From admission, onward)    Start     Dose/Rate Route Frequency Ordered Stop   03/28/22 0400  piperacillin-tazobactam (ZOSYN) IVPB 3.375 g        3.375 g 12.5 mL/hr over 240 Minutes Intravenous Every 8 hours 03/27/22 2332     03/27/22 1915  piperacillin-tazobactam (ZOSYN) IVPB 3.375 g        3.375 g 100 mL/hr over 30 Minutes Intravenous  Once 03/27/22 1911 03/27/22 1951      Culture/Microbiology    Component Value Date/Time   SDES URINE, CLEAN CATCH 04/04/2011 1942   SPECREQUEST Normal 04/04/2011 1942   CULT  04/04/2011 1942    Multiple bacterial morphotypes present, none predominant. Suggest appropriate recollection if clinically indicated.   REPTSTATUS 04/05/2011 FINAL 04/04/2011 1942   Other culture-see note Radiology Studies: CT Abdomen Pelvis W Contrast  Result Date: 03/27/2022 CLINICAL DATA:  Left lower quadrant abdominal pain. EXAM: CT ABDOMEN AND PELVIS WITH CONTRAST TECHNIQUE: Multidetector CT imaging of the abdomen and pelvis was performed using the standard protocol following bolus administration of intravenous contrast. RADIATION DOSE REDUCTION: This exam was performed according to the departmental dose-optimization program which includes automated exposure control,  adjustment of the mA and/or kV according to patient size and/or use of iterative reconstruction technique. CONTRAST:  170mL OMNIPAQUE IOHEXOL 300 MG/ML  SOLN COMPARISON:  CT abdomen pelvis dated 11/07/2021. FINDINGS: Lower chest: The visualized lung bases are clear. There is coronary vascular calcification. No intra-abdominal free air or free fluid. Hepatobiliary: Mild fatty liver. There is dilatation of the biliary trees, likely post cholecystectomy. No retained calcified stone noted in the central CBD. Pancreas: Unremarkable. No pancreatic ductal dilatation or surrounding inflammatory changes. Spleen: Normal in size without focal abnormality. Adrenals/Urinary Tract: The adrenal glands unremarkable. Subcentimeter right renal hypodense lesions are too small to characterize. There is no hydronephrosis on either side. There is symmetric enhancement and excretion of contrast by both kidneys. The visualized ureters and urinary bladder appear unremarkable. Stomach/Bowel: There is sigmoid diverticulosis. There is inflammatory changes and stranding adjacent to a sigmoid diverticula in the distal descending/sigmoid junction consistent with acute diverticulitis. No diverticular abscess or perforation. There is loose stool throughout the colon. There is no bowel obstruction. The appendix is not visualized with certainty. No inflammatory changes identified in the right lower quadrant. Vascular/Lymphatic: Moderate aortoiliac atherosclerotic disease. The IVC is unremarkable. No portal venous gas. There is no adenopathy. Reproductive: Hysterectomy.  No adnexal masses. Other: Midline vertical anterior pelvic wall incisional scar. Musculoskeletal: Osteopenia with degenerative changes of the spine. No acute osseous pathology. IMPRESSION: 1. Acute sigmoid diverticulitis. No diverticular abscess or perforation. 2. No bowel obstruction. 3. Mild fatty liver. 4.  Aortic Atherosclerosis (ICD10-I70.0). Electronically Signed   By: Anner Crete M.D.   On: 03/27/2022 19:08     LOS: 1 day   Antonieta Pert, MD [facility]  03/29/2022, 2:29 PM

## 2022-03-29 NOTE — Progress Notes (Signed)
Mobility Specialist - Progress Note   03/29/22 1451  Mobility  Activity Ambulated with assistance in hallway  Level of Assistance Independent after set-up  Assistive Device Front wheel walker  Distance Ambulated (ft) 500 ft  Activity Response Tolerated well  Mobility Referral Yes  $Mobility charge 1 Mobility   Pt received in recliner and agreeable to mobility. No complaints during mobility. Pt to recliner after session with all needs met.    [facility]

## 2022-03-30 ENCOUNTER — Telehealth (HOSPITAL_COMMUNITY): Payer: Self-pay

## 2022-03-30 ENCOUNTER — Other Ambulatory Visit (HOSPITAL_COMMUNITY): Payer: Self-pay

## 2022-03-30 DIAGNOSIS — K5792 Diverticulitis of intestine, part unspecified, without perforation or abscess without bleeding: Secondary | ICD-10-CM | POA: Diagnosis not present

## 2022-03-30 LAB — CBC
HCT: 32.4 % — ABNORMAL LOW (ref 36.0–46.0)
Hemoglobin: 10.4 g/dL — ABNORMAL LOW (ref 12.0–15.0)
MCH: 30.3 pg (ref 26.0–34.0)
MCHC: 32.1 g/dL (ref 30.0–36.0)
MCV: 94.5 fL (ref 80.0–100.0)
Platelets: 169 10*3/uL (ref 150–400)
RBC: 3.43 MIL/uL — ABNORMAL LOW (ref 3.87–5.11)
RDW: 13 % (ref 11.5–15.5)
WBC: 5 10*3/uL (ref 4.0–10.5)
nRBC: 0 % (ref 0.0–0.2)

## 2022-03-30 LAB — COMPREHENSIVE METABOLIC PANEL
ALT: 37 U/L (ref 0–44)
AST: 29 U/L (ref 15–41)
Albumin: 2.9 g/dL — ABNORMAL LOW (ref 3.5–5.0)
Alkaline Phosphatase: 60 U/L (ref 38–126)
Anion gap: 6 (ref 5–15)
BUN: 7 mg/dL — ABNORMAL LOW (ref 8–23)
CO2: 23 mmol/L (ref 22–32)
Calcium: 8 mg/dL — ABNORMAL LOW (ref 8.9–10.3)
Chloride: 112 mmol/L — ABNORMAL HIGH (ref 98–111)
Creatinine, Ser: 0.81 mg/dL (ref 0.44–1.00)
GFR, Estimated: >60 mL/min (ref >60)
Glucose, Bld: 101 mg/dL — ABNORMAL HIGH (ref 70–99)
Potassium: 3.7 mmol/L (ref 3.5–5.1)
Sodium: 141 mmol/L (ref 135–145)
Total Bilirubin: 0.4 mg/dL (ref 0.3–1.2)
Total Protein: 5.2 g/dL — ABNORMAL LOW (ref 6.5–8.1)

## 2022-03-30 MED ORDER — VANCOMYCIN HCL 125 MG PO CAPS
125.0000 mg | ORAL_CAPSULE | Freq: Four times a day (QID) | ORAL | Status: DC
  Administered 2022-03-30 – 2022-03-31 (×5): 125 mg via ORAL
  Filled 2022-03-30 (×7): qty 1

## 2022-03-30 MED ORDER — SUCRALFATE 1 GM/10ML PO SUSP
1.0000 g | Freq: Once | ORAL | Status: AC
  Administered 2022-03-30: 1 g via ORAL
  Filled 2022-03-30: qty 10

## 2022-03-30 NOTE — Progress Notes (Signed)
Physical Therapy Treatment Patient Details Name: Vanessa Fernandez MRN: [id number] DOB: 1925-10-05 Today's Date: 03/30/2022   History of Present Illness 88 y.o. female admitted 03/27/22 to [facility] ED with complaints of left lower quadrant abdominal pain. CT findings of acute sigmoid diverticulitis without abscess or perforation. PMH includes chronic anxiety/depression, paroxysmal A-fib on Eliquis, obesity.    PT Comments    Patient was found in recliner prior to treatment. Pt tolerated treatment well. Required supervision for transfers, and min guard needed for ambulation. Ambulated with straight cane for 200 ft and progressed patient to no AD for 150 ft, tolerated well. Educated patient to continue to use cane when ambulating outside of home. Patient plans on discharging back home alone. "I rarely drive so I'm good."  Recommendations for follow up therapy are one component of a multi-disciplinary discharge planning process, led by the attending physician.  Recommendations may be updated based on patient status, additional functional criteria and insurance authorization.  Follow Up Recommendations  Home health PT     Assistance Recommended at Discharge Intermittent Supervision/Assistance  Patient can return home with the following A little help with walking and/or transfers;A little help with bathing/dressing/bathroom;Assist for transportation;Help with stairs or ramp for entrance   Equipment Recommendations  None recommended by PT    Recommendations for Other Services       Precautions / Restrictions Precautions Precautions: Fall Restrictions Weight Bearing Restrictions: No     Mobility  Bed Mobility                    Transfers Overall transfer level: Needs assistance Equipment used: None Transfers: Sit to/from Stand Sit to Stand: Supervision           General transfer comment: Supervision needed for safety.    Ambulation/Gait Ambulation/Gait assistance: Min  guard Gait Distance (Feet): 350 Feet Assistive device: None, Straight cane Gait Pattern/deviations: Step-through pattern, Decreased stride length, Trunk flexed       General Gait Details: Min guard for safety. Ambulated w/ cane for first half and progressed pt in the second half to no AD, tolerated it well.   Stairs             Wheelchair Mobility    Modified Rankin (Stroke Patients Only)       Balance                                            Cognition Arousal/Alertness: Awake/alert Behavior During Therapy: WFL for tasks assessed/performed Overall Cognitive Status: Within Functional Limits for tasks assessed                                          Exercises      General Comments        Pertinent Vitals/Pain Pain Assessment Pain Assessment: No/denies pain Pain Intervention(s): Monitored during session    Home Living                          Prior Function            PT Goals (current goals can now be found in the care plan section) Acute Rehab PT Goals Patient Stated Goal: home soon, less pain PT Goal Formulation: With patient/family Time  For Goal Achievement: 04/11/22 Potential to Achieve Goals: Good Progress towards PT goals: Progressing toward goals    Frequency    Min 3X/week      PT Plan      Co-evaluation              AM-PAC PT "6 Clicks" Mobility   Outcome Measure  Help needed turning from your back to your side while in a flat bed without using bedrails?: None Help needed moving from lying on your back to sitting on the side of a flat bed without using bedrails?: None Help needed moving to and from a bed to a chair (including a wheelchair)?: A Little Help needed standing up from a chair using your arms (e.g., wheelchair or bedside chair)?: None Help needed to walk in hospital room?: A Little Help needed climbing 3-5 steps with a railing? : A Little 6 Click Score: 21     End of Session Equipment Utilized During Treatment: Gait belt Activity Tolerance: Patient tolerated treatment well Patient left: in bed;with call bell/phone within reach;with family/visitor present;with bed alarm set   PT Visit Diagnosis: Unsteadiness on feet (R26.81);Muscle weakness (generalized) (M62.81)     Time: 4818-5631 PT Time Calculation (min) (ACUTE ONLY): 13 min  Charges:  $Gait Training: 8-22 mins                        Amina Menchaca 03/30/2022, 1:01 PM

## 2022-03-30 NOTE — Progress Notes (Signed)
Mobility Specialist - Progress Note  03/30/22 1448  Mobility  Activity Ambulated with assistance in hallway  Level of Assistance Independent after set-up  Assistive Device  (IV Pole)  Distance Ambulated (ft) 500 ft  Range of Motion/Exercises Active  Activity Response Tolerated well  Mobility Referral Yes  $Mobility charge 1 Mobility   Pt received in bathroom and agreeable to mobility. No complaints during mobility. Pt to recliner after session with all needs met.     [facility]

## 2022-03-30 NOTE — Progress Notes (Signed)
PROGRESS NOTE Vanessa Fernandez  [id number] DOB: 02/14/1926 DOA: 03/27/2022 PCP: Derrill Center., MD   Brief Narrative/Hospital Course: 26 old female with GERD chronic anxiety/depression, PAF on Eliquis, class I obesity admitted with working diagnosis of acute sigmoid diverticulitis without perforation  She presented to ED 10/31 with LLQ abdominal pain 1 day PTA with nausea. In the ED CT abdomen-acute sigmoid diverticulitis without abscess or perforation placed on IV Zosyn antiemetics pain management IV fluids, and admitted    Subjective: Seen and examined this morning  Patient left lower quadrant abdominal pain feels much better Has had multiple small-volume loose stool yesterday and 1 this morning No nausea vomiting chest pain   Assessment and Plan: Principal Problem:   Acute diverticulitis Active Problems:   Diverticulitis   A-fib ([place])   Hypothyroidism   Acute uncomplicated sigmoid diverticulitis: On Zosyn, clear liquid diet IVF antiemetics pain control and clinically improving WBC count remained stable afebrile.  Advance to full liquid diet, continue gentle IV fluid hydration given her soft blood pressure.  Followed by [facility]. [facility] and advised to contact upon discharge for colonoscopy patient reports he can have the colonoscopy done after Dr. Shana Chute could not complete it.  If persistent abdominal pain will ask GI to see her  C. difficile diarrhea-CDIFF antigen positive, toxin negative then Reflex toxigenic C. difficile by PCR was positive discussed with Dr. Johnnye Sima advised starting treatment started vancomycin will need to extend vancomycin 1 more week following completion of antibiotic for sigmoid diverticulitis  Soft diastolic BP given IV hydration-BP much improved, tolerating p.o. we will DC IV fluids   Elevated LFTs fatty liver noted on CT, resolved.  Recent Labs  Lab 03/27/22 1503 03/28/22 0436 03/29/22 0742 03/30/22 0437  AST 28 120* 49* 29  ALT 18  79* 51* 37  ALKPHOS 75 61 67 60  BILITOT 0.8 1.1 0.9 0.4  PROT 6.6 5.4* 5.6* 5.2*  ALBUMIN 3.9 3.2* 3.2* 2.9*     PAF cont her eliquis, flecainide. Hypothyroidism cont her synthroid Mild drop in hemoglobin unclear etiology-possibly hemodilution.  Monitor.   Recent Labs    11/07/21 1830 03/27/22 1503 03/28/22 0436 03/30/22 0437  HGB 13.8 13.4 11.2* 10.4*  MCV 92.9 92.4 93.7 94.5   Deconditioning debility PT OT recommending home health  Class I Obesity:Patient's Body mass index is 31.75 kg/m. : Will benefit with PCP follow-up, weight loss  healthy lifestyle and outpatient sleep evaluation.  DVT prophylaxis: apixaban (ELIQUIS) tablet 2.5 mg Start: 03/27/22 2200 Code Status:   Code Status: Full Code Family Communication: plan of care discussed with patient/son at bedside. Patient status is: Inpatient because of diverticulitis Level of care: Med-Surg  Dispo: The patient is from: Home.Current activity level is 5            Anticipated disposition: Home in next 1 day  Objective: Vitals last 24 hrs: Vitals:   03/29/22 1832 03/29/22 2120 03/30/22 0536 03/30/22 1220  BP: 100/61 (!) 125/50 (!) 99/54 (!) 140/60  Pulse: 68 69 (!) 59 (!) 58  Resp: 16 18 18 17   Temp: 98.4 F (36.9 C) 98.7 F (37.1 C) 98.2 F (36.8 C) 97.8 F (36.6 C)  TempSrc:  Oral Oral Oral  SpO2: 98% 96% 92% 97%  Weight:      Height:       Weight change:   Physical Examination: General exam: AAOX3, weak,older appearing HEENT:Oral mucosa moist, Ear/Nose WNL grossly, dentition normal. Respiratory system: bilaterally CLEAR BS, no use of  accessory muscle Cardiovascular system: S1 & S2 +, regular rate, JVD neg. Gastrointestinal system: Abdomen soft, tender mildly on LLQ Nervous System:Alert, awake, moving extremities and grossly nonfocal Extremities: LE ankle edema neg, lower extremities warm Skin: No rashes,no icterus. MSK: Normal muscle bulk,tone, power   Medications reviewed:  Scheduled Meds:   apixaban  2.5 mg Oral BID   famotidine  20 mg Oral Daily   flecainide  25 mg Oral QHS   flecainide  50 mg Oral q AM   latanoprost  1 drop Both Eyes QHS   levothyroxine  112 mcg Oral Q0600   vancomycin  125 mg Oral QID   cyanocobalamin  100 mcg Oral Daily   Continuous Infusions:  piperacillin-tazobactam 3.375 g (03/30/22 0534)    Diet Order             Diet full liquid Room service appropriate? Yes; Fluid consistency: Thin  Diet effective now                  Intake/Output Summary (Last 24 hours) at 03/30/2022 1220 Last data filed at 03/30/2022 1214 Gross per 24 hour  Intake 4887.03 ml  Output --  Net 4887.03 ml    Net IO Since Admission: 8,564.74 mL [03/30/22 1220]  Wt Readings from Last 3 Encounters:  03/27/22 83.9 kg  11/07/21 83.9 kg  11/07/20 82.6 kg     Unresulted Labs (From admission, onward)     Start     Ordered   03/30/22 0500  Comprehensive metabolic panel  Daily at 5am,   R      03/29/22 0747   03/30/22 0500  CBC  Daily at 5am,   R      03/29/22 0747          Data Reviewed: I have personally reviewed following labs and imaging studies CBC: Recent Labs  Lab 03/27/22 1503 03/28/22 0436 03/30/22 0437  WBC 10.5 7.9 5.0  NEUTROABS  --  5.0  --   HGB 13.4 11.2* 10.4*  HCT 41.1 34.3* 32.4*  MCV 92.4 93.7 94.5  PLT 251 179 [id number]    Basic Metabolic Panel: Recent Labs  Lab 03/27/22 1503 03/28/22 0436 03/29/22 0742 03/30/22 0437  NA 136 139 141 141  K 3.7 3.7 4.0 3.7  CL 103 107 111 112*  CO2 24 26 26 23   GLUCOSE 145* 108* 99 101*  BUN 12 11 10  7*  CREATININE 0.84 0.84 0.85 0.81  CALCIUM 8.6* 8.0* 8.0* 8.0*  MG  --  1.9  --   --   PHOS  --  3.2  --   --     GFR: Estimated Creatinine Clearance: 44.5 mL/min (by C-G formula based on SCr of 0.81 mg/dL). Liver Function Tests: Recent Labs  Lab 03/27/22 1503 03/28/22 0436 03/29/22 0742 03/30/22 0437  AST 28 120* 49* 29  ALT 18 79* 51* 37  ALKPHOS 75 61 67 60  BILITOT 0.8 1.1 0.9 0.4   PROT 6.6 5.4* 5.6* 5.2*  ALBUMIN 3.9 3.2* 3.2* 2.9*    Recent Results (from the past 240 hour(s))  C Difficile Quick Screen w PCR reflex     Status: Abnormal   Collection Time: 03/29/22 11:21 AM   Specimen: STOOL  Result Value Ref Range Status   C Diff antigen POSITIVE (A) NEGATIVE Final   C Diff toxin NEGATIVE NEGATIVE Final   C Diff interpretation Results are indeterminate. See PCR results.  Final    Comment: Performed at [facility] [place], [place] [address], [place], [place] [address]  C. Diff by PCR, Reflexed     Status: Abnormal   Collection Time: 03/29/22 11:21 AM  Result Value Ref Range Status   Toxigenic C. Difficile by PCR POSITIVE (A) NEGATIVE Final    Comment: Positive for toxigenic C. difficile with little to no toxin production. Only treat if clinical presentation suggests symptomatic illness. Performed at [facility], [place] [address] [place], [place] [address]     Antimicrobials: Anti-infectives (From admission, onward)    Start     Dose/Rate Route Frequency Ordered Stop   03/30/22 1045  vancomycin (VANCOCIN) capsule 125 mg        125 mg Oral 4 times daily 03/30/22 0959 04/09/22 0959   03/28/22 0400  piperacillin-tazobactam (ZOSYN) IVPB 3.375 g        3.375 g 12.5 mL/hr over 240 Minutes Intravenous Every 8 hours 03/27/22 2332     03/27/22 1915  piperacillin-tazobactam (ZOSYN) IVPB 3.375 g        3.375 g 100 mL/hr over 30 Minutes Intravenous  Once 03/27/22 1911 03/27/22 1951      Culture/Microbiology    Component Value Date/Time   SDES URINE, CLEAN CATCH 04/04/2011 1942   SPECREQUEST Normal 04/04/2011 1942   CULT  04/04/2011 1942    Multiple bacterial morphotypes present, none predominant. Suggest appropriate recollection if clinically indicated.   REPTSTATUS 04/05/2011 FINAL 04/04/2011 1942   Other culture-see note Radiology Studies: No results found.   LOS: 2 days   Antonieta Pert, MD [facility]  03/30/2022, 12:20 PM

## 2022-03-30 NOTE — Telephone Encounter (Signed)
Pharmacy Patient Advocate Encounter  Prior Authorization for Vancomycin 125mg  has been approved.    PA# [id number] Effective dates: 05/29/2021 through 05/29/2023  Patient's copay is $36.15.  Spoke with Pharmacy to process.

## 2022-03-30 NOTE — TOC Benefit Eligibility Note (Signed)
Patient Advocate Encounter  Insurance verification completed.    The patient is currently admitted and upon discharge could be taking Vancomycin 125mg.  Requires PA  The patient is insured through [employer]     Sharica Roedel, CPHT Pharmacy Patient Advocate Specialist  Pharmacy Patient Advocate Team Direct Number: [phone] Fax: [phone]           

## 2022-03-30 NOTE — Telephone Encounter (Signed)
Patient Advocate Encounter   Received notification from [facility] that prior authorization for Vancomycin 125mg  is required.   PA submitted on 03/30/2022 [place]  [id number] Status is pending       Joneen Boers, [place] Patient Advocate Specialist [place] Patient Advocate Team Direct Number: [phone] Fax: [phone]

## 2022-03-31 ENCOUNTER — Other Ambulatory Visit (HOSPITAL_COMMUNITY): Payer: Self-pay

## 2022-03-31 DIAGNOSIS — K5792 Diverticulitis of intestine, part unspecified, without perforation or abscess without bleeding: Secondary | ICD-10-CM | POA: Diagnosis not present

## 2022-03-31 LAB — COMPREHENSIVE METABOLIC PANEL
ALT: 29 U/L (ref 0–44)
AST: 23 U/L (ref 15–41)
Albumin: 2.9 g/dL — ABNORMAL LOW (ref 3.5–5.0)
Alkaline Phosphatase: 50 U/L (ref 38–126)
Anion gap: 7 (ref 5–15)
BUN: 7 mg/dL — ABNORMAL LOW (ref 8–23)
CO2: 23 mmol/L (ref 22–32)
Calcium: 8.3 mg/dL — ABNORMAL LOW (ref 8.9–10.3)
Chloride: 110 mmol/L (ref 98–111)
Creatinine, Ser: 0.78 mg/dL (ref 0.44–1.00)
GFR, Estimated: >60 mL/min (ref >60)
Glucose, Bld: 92 mg/dL (ref 70–99)
Potassium: 3.4 mmol/L — ABNORMAL LOW (ref 3.5–5.1)
Sodium: 140 mmol/L (ref 135–145)
Total Bilirubin: 0.3 mg/dL (ref 0.3–1.2)
Total Protein: 5 g/dL — ABNORMAL LOW (ref 6.5–8.1)

## 2022-03-31 LAB — CBC
HCT: 31.5 % — ABNORMAL LOW (ref 36.0–46.0)
Hemoglobin: 10.2 g/dL — ABNORMAL LOW (ref 12.0–15.0)
MCH: 30.7 pg (ref 26.0–34.0)
MCHC: 32.4 g/dL (ref 30.0–36.0)
MCV: 94.9 fL (ref 80.0–100.0)
Platelets: 199 10*3/uL (ref 150–400)
RBC: 3.32 MIL/uL — ABNORMAL LOW (ref 3.87–5.11)
RDW: 13 % (ref 11.5–15.5)
WBC: 5.3 10*3/uL (ref 4.0–10.5)
nRBC: 0 % (ref 0.0–0.2)

## 2022-03-31 MED ORDER — AMOXICILLIN-POT CLAVULANATE 875-125 MG PO TABS
1.0000 | ORAL_TABLET | Freq: Two times a day (BID) | ORAL | 0 refills | Status: AC
  Filled 2022-03-31: qty 14, 7d supply, fill #0

## 2022-03-31 MED ORDER — PANTOPRAZOLE SODIUM 40 MG PO TBEC
40.0000 mg | DELAYED_RELEASE_TABLET | Freq: Every day | ORAL | Status: DC
  Administered 2022-03-31: 40 mg via ORAL
  Filled 2022-03-31: qty 1

## 2022-03-31 MED ORDER — SUCRALFATE 1 GM/10ML PO SUSP: 1.0000 g | Freq: Every day | ORAL | Status: DC | PRN

## 2022-03-31 MED ORDER — POTASSIUM CHLORIDE CRYS ER 20 MEQ PO TBCR
40.0000 meq | EXTENDED_RELEASE_TABLET | Freq: Once | ORAL | Status: AC
  Administered 2022-03-31: 40 meq via ORAL
  Filled 2022-03-31: qty 2

## 2022-03-31 MED ORDER — VANCOMYCIN HCL 125 MG PO CAPS
125.0000 mg | ORAL_CAPSULE | Freq: Four times a day (QID) | ORAL | 0 refills | Status: AC
  Filled 2022-03-31: qty 22, 5d supply, fill #0
  Filled 2022-03-31: qty 30, 8d supply, fill #0

## 2022-03-31 MED ORDER — SUCRALFATE 1 GM/10ML PO SUSP
1.0000 g | Freq: Once | ORAL | Status: AC
  Administered 2022-03-31: 1 g via ORAL
  Filled 2022-03-31: qty 10

## 2022-03-31 MED ORDER — PANTOPRAZOLE SODIUM 40 MG PO TBEC
40.0000 mg | DELAYED_RELEASE_TABLET | Freq: Every day | ORAL | 0 refills | Status: DC
  Filled 2022-03-31: qty 30, 30d supply, fill #0

## 2022-03-31 NOTE — Care Management Important Message (Signed)
Important Message  Patient Details IM Letter placed in Patients room. Name: Vanessa Fernandez MRN: [id number] Date of Birth: January 21, 1926   Medicare Important Message Given:  Yes     Kerin Salen 03/31/2022, 9:39 AM

## 2022-03-31 NOTE — Consult Note (Signed)
Referring Provider: [facility] Primary Care Physician:  Elijio Miles., MD Primary Gastroenterologist:  [facility])  Reason for Consultation:  diverticulitis, C. Diff, GERD  HPI: Vanessa Fernandez is a 88 y.o. female with medical history significant for GERD, chronic anxiety/depression, paroxysmal A-fib on Eliquis, obesity, who initially presented to [facility] ED on 10/30 with complaints of left lower quadrant abdominal pain for 1 day.  Patient reports that 1 day prior to admission she began to have left lower quadrant abdominal pain with associated nausea.  Denies vomiting.  Denies fevers, chills and diarrhea.  She has had multiple episodes of diverticulitis in the past.  Last 6 months ago.  She will often get antibiotics from her primary care with resolution of diverticulitis symptoms.  Due to her symptoms worsening she decided to present to the emergency room.  Initially in the emergency room CT abdomen pelvis showed acute sigmoid diverticulitis without complications she was started on empiric Zosyn.  She was then admitted.   During her admission her abdominal pain is improved with Zosyn.  Nausea improved with antiemetics.  Admission patient began having diarrhea on 11/1.  Reports her stools were watery and non-bloody.  Quick screen 03/29/2022 with negative C. difficile toxin and positive C. difficile antigen, reflux C. difficile PCR positive.  She was started on vancomycin 03/30/2022. On 03/30/2022 overnight she began to have severe reflux.  Notes reflux symptoms resolved after taking Carafate.  She takes Carafate 1 g by mouth daily at home.   This morning notes her abdominal pain is near completely resolved.  Her reflux improved with Carafate.  EGD 09/2014: Nonocclusive GEJ stricture, hiatal hernia, mild gastritis, duodenal ulcers negative biopsies  Per chart review patient has history of chronic epigastric discomfort and reflux, previously managed by [facility]  Last colonoscopy many years  ago, notes she was unable to have colonoscopy completed, no records on care everywhere or [facility].  She is taking Eliquis for A-fib. Denies NSAID use.  Past Medical History:  Diagnosis Date   Depression    Diverticulitis    GERD (gastroesophageal reflux disease)    Glaucoma    Thyroid disease     Past Surgical History:  Procedure Laterality Date   JOINT REPLACEMENT      Prior to Admission medications   Medication Sig Start Date End Date Taking? Authorizing Provider  acetaminophen (TYLENOL) 500 MG tablet Take 1,000 mg by mouth every 6 (six) hours as needed. For pain    Yes [provider]  apixaban (ELIQUIS) 5 MG TABS tablet Take 2.5 mg by mouth 2 (two) times daily. 07/21/19  Yes [provider]  Cholecalciferol (VITAMIN D3) 50 MCG (2000 UT) capsule Take 2,000 Units by mouth daily.   Yes [provider]  clorazepate (TRANXENE) 7.5 MG tablet Take 7.5 mg by mouth at bedtime as needed for sleep.   Yes [provider]  cromolyn (NASALCROM) 5.2 MG/ACT nasal spray Place 1 spray into the nose daily as needed (nasal congestion).   Yes [provider]  Cyanocobalamin (VITAMIN B-12 PO) Take 1 tablet by mouth daily.   Yes [provider]  famotidine (PEPCID) 20 MG tablet Take 20 mg by mouth daily.   Yes [provider]  flecainide (TAMBOCOR) 50 MG tablet Take 25-50 mg by mouth See admin instructions. 25 mg in the morning, 50 mg in the evening.   Yes [provider]  latanoprost (XALATAN) 0.005 % ophthalmic solution Place 1 drop into both eyes at bedtime. 05/30/19  Yes [provider]  levothyroxine (SYNTHROID) 112 MCG tablet Take 112 mcg by mouth at bedtime. 12/29/19  Yes [provider]  magnesium oxide (MAG-OX) 400 MG tablet Take 400 mg by mouth daily.   Yes [provider]  Multiple Vitamin (MULTI-VITAMIN) tablet Take 1 tablet by mouth daily.   Yes [provider]  sucralfate (CARAFATE) 1  GM/10ML suspension Take 1 g by mouth daily as needed (severe acid reflux).   Yes [provider]    Scheduled Meds:  apixaban  2.5 mg Oral BID   famotidine  20 mg Oral Daily   flecainide  25 mg Oral QHS   flecainide  50 mg Oral q AM   latanoprost  1 drop Both Eyes QHS   levothyroxine  112 mcg Oral Q0600   pantoprazole  40 mg Oral Daily   vancomycin  125 mg Oral QID   cyanocobalamin  100 mcg Oral Daily   Continuous Infusions:  piperacillin-tazobactam 3.375 g (03/31/22 0556)   PRN Meds:.acetaminophen, calcium carbonate, HYDROmorphone (DILAUDID) injection, oxyCODONE, polyethylene glycol, prochlorperazine  Allergies as of 03/27/2022 - Review Complete 03/27/2022  Allergen Reaction Noted   Aspirin Other (See Comments) 03/31/2011   Bextra [valdecoxib] Other (See Comments) 03/31/2011   Celebrex [celecoxib] Other (See Comments) 03/31/2011   Codeine Nausea And Vomiting 03/31/2011   Ibuprofen Other (See Comments) 03/31/2011   Naproxen Other (See Comments) 03/31/2011   Sulfa antibiotics Other (See Comments) 03/31/2011    History reviewed. No pertinent family history.  Social History   Socioeconomic History   Marital status: Widowed    Spouse name: Not on file   Number of children: Not on file   Years of education: Not on file   Highest education level: Not on file  Occupational History   Not on file  Tobacco Use   Smoking status: Never   Smokeless tobacco: Not on file  Substance and Sexual Activity   Alcohol use: No   Drug use: No   Sexual activity: Never  Other Topics Concern   Not on file  Social History Narrative   Not on file   Social Determinants of Health   Financial Resource Strain: Not on file  Food Insecurity: No Food Insecurity (03/27/2022)   Hunger Vital Sign    Worried About Running Out of Food in the Last Year: Never true    Ran Out of Food in the Last Year: Never true  Transportation Needs: No Transportation Needs (03/27/2022)   PRAPARE -  Administrator, Civil Service (Medical): No    Lack of Transportation (Non-Medical): No  Physical Activity: Not on file  Stress: Not on file  Social Connections: Not on file  Intimate Partner Violence: Not At Risk (03/27/2022)   Humiliation, Afraid, Rape, and Kick questionnaire    Fear of Current or Ex-Partner: No    Emotionally Abused: No    Physically Abused: No    Sexually Abused: No    Review of Systems: All negative except as stated above in HPI.  Physical Exam:Physical Exam Constitutional:      General: She is not in acute distress.    Appearance: Normal appearance. She is normal weight.  HENT:     Head: Normocephalic and atraumatic.     Right Ear: External ear normal.     Left Ear: External ear normal.     Nose: Nose normal.     Mouth/Throat:     Mouth: Mucous membranes are moist.  Eyes:  Pupils: Pupils are equal, round, and reactive to light.  Cardiovascular:     Rate and Rhythm: Normal rate and regular rhythm.     Pulses: Normal pulses.     Heart sounds: Normal heart sounds.  Pulmonary:     Effort: Pulmonary effort is normal.     Breath sounds: Normal breath sounds.  Abdominal:     General: Abdomen is flat. Bowel sounds are normal. There is no distension.     Palpations: Abdomen is soft. There is no mass.     Tenderness: There is no abdominal tenderness. There is no guarding or rebound.     Hernia: No hernia is present.  Musculoskeletal:        General: No swelling. Normal range of motion.     Cervical back: Normal range of motion and neck supple.  Skin:    General: Skin is warm and dry.  Neurological:     General: No focal deficit present.     Mental Status: She is alert and oriented to person, place, and time. Mental status is at baseline.  Psychiatric:        Mood and Affect: Mood normal.        Behavior: Behavior normal.     Vital signs: Vitals:   03/30/22 2012 03/31/22 0615  BP: (!) 120/54 (!) 116/34  Pulse: 65 63  Resp: 17 17   Temp: 98.3 F (36.8 C) 98.6 F (37 C)  SpO2: 97% 92%   Last BM Date : 03/30/22    GI:  Lab Results: Recent Labs    03/30/22 0437 03/31/22 0442  WBC 5.0 5.3  HGB 10.4* 10.2*  HCT 32.4* 31.5*  PLT 169 199   BMET Recent Labs    03/29/22 0742 03/30/22 0437 03/31/22 0442  NA 141 141 140  K 4.0 3.7 3.4*  CL 111 112* 110  CO2 26 23 23   GLUCOSE 99 101* 92  BUN 10 7* 7*  CREATININE 0.85 0.81 0.78  CALCIUM 8.0* 8.0* 8.3*   LFT Recent Labs    03/31/22 0442  PROT 5.0*  ALBUMIN 2.9*  AST 23  ALT 29  ALKPHOS 50  BILITOT 0.3   PT/INR No results for input(s): "LABPROT", "INR" in the last 72 hours.   Studies/Results: No results found.  Impression: Chronic GERD C. difficile diarrhea Diverticulitis  Patient currently with moderate symptom control with famotidine 20 mg daily, Protonix 40 mg daily, sucralfate.  Likely her diarrhea is due to C. difficile infection.  Diarrhea should improve with continuing antibiotic therapy.  Agree with vancomycin, continue current course.  Abdominal pain significantly improved with Zosyn therapy.  Resolution of diverticulitis recommend high-fiber diet.  Consider transitioning to oral antibiotic therapy.   Plan: Continue famotidine 20 mg daily Continue Protonix 40 mg daily Can start sucralfate 1 g up to 3 times daily with meals as needed Continue vancomycin therapy for C. Difficile Continue antibiotic therapy for diverticulitis, recommend patient follow-up with her outpatient GI for discussion of repeat colonoscopy as appropriate as well as further preventative measures for recurrent diverticulitis. Patient also follow-up with outside GI for reflux. [facility] will sign off  LOS: 3 days   Charlott Rakes  PA-C 03/31/2022, 11:36 AM  Contact #  [phone]

## 2022-03-31 NOTE — Progress Notes (Signed)
Discharge instructions discussed with patient, verbalized agreement and understanding 

## 2022-03-31 NOTE — Discharge Summary (Signed)
Physician Discharge Summary  Vanessa Fernandez KKX:[id number] DOB: 1926-05-29 DOA: 03/27/2022  PCP: Elijio Miles., MD  Admit date: 03/27/2022 Discharge date: 03/31/2022 Recommendations for Outpatient Follow-up:  Follow up with PCP in 1 weeks-call for appointment Follow up with your GI at [facility] Please obtain BMP/CBC in one week  Discharge Dispo: home Discharge Condition: Stable Code Status:   Code Status: Full Code Diet recommendation:  Diet Order             DIET SOFT Room service appropriate? Yes; Fluid consistency: Thin  Diet effective now                   Brief/Interim Summary: 5 old female with GERD chronic anxiety/depression, PAF on Eliquis, class I obesity admitted with working diagnosis of acute sigmoid diverticulitis without perforation  She presented to ED 10/31 with LLQ abdominal pain 1 day PTA with nausea. In the ED CT abdomen-acute sigmoid diverticulitis without abscess or perforation placed on IV Zosyn antiemetics pain management IV fluids, and admitted Patient was having diarrhea got tested positive for antigen toxin negative blood PCR toxigenic C. difficile positive discussed with Dr. Ninetta Lights placed on C. difficile treatment. Left lower quadrant abdominal pain improving on antibiotics, diet advanced and tolerating Having reflux epigastric pain placed on Protonix, she takes Carafate NORMALLY Placed on Carafate Pepcid PPI.  Seen by GI at this time okay for discharge home and follow-up with gastroenterology as outpatient continue C Dif treatment and Diverticulitis treatment   Discharge Diagnoses:  Principal Problem:   Acute diverticulitis Active Problems:   Diverticulitis   A-fib (HCC)   Hypothyroidism  Acute uncomplicated sigmoid diverticulitis: On Zosyn, clear liquid diet IVF antiemetics pain control and clinically improving WBC count remained stable afebrile.  Tolerating diet seen by [facility], patient will continue oral Augmentin to complete the  course. She will folllow up with Dr. Mikle Bosworth GI and advised to contact upon discharge for colonoscopy   C. difficile diarrhea-C Diff antigen positive, toxin negative then Reflex toxigenic C. difficile by PCR was positive discussed with Dr. Ninetta Lights advised starting treatment started vancomycin will need to extend vancomycin 1 more week following completion of antibiotic for sigmoid diverticulitis   Soft diastolic BP given IV hydration-BP much improved Elevated LFTs fatty liver noted on CT, resolved.  PAF cont her eliquis, flecainide. Hypothyroidism cont her synthroid Mild drop in hemoglobin-likely dilutional, 10-11 gm. Follow-up with PCP Deconditioning debility PT OT recommending home health  Class I Obesity:Patient's Body mass index is 31.75 kg/m. : Will benefit with PCP follow-up, weight loss  healthy lifestyle and outpatient sleep evaluation.  Consults: Gastroenterology, infectious disease Subjective: Alert awake oriented resting comfortably tolerating diet some heartburn see overnight  Discharge Exam: Vitals:   03/31/22 0615 03/31/22 1227  BP: (!) 116/34 (!) 141/80  Pulse: 63 65  Resp: 17   Temp: 98.6 F (37 C) 97.8 F (36.6 C)  SpO2: 92% 99%   General: Pt is alert, awake, not in acute distress Cardiovascular: RRR, S1/S2 +, no rubs, no gallops Respiratory: CTA bilaterally, no wheezing, no rhonchi Abdominal: Soft, NT, ND, bowel sounds + Extremities: no edema, no cyanosis  Discharge Instructions  Discharge Instructions     Discharge instructions   Complete by: As directed    Please call call MD or return to ER for similar or worsening recurring problem that brought you to hospital or if any fever,nausea/vomiting,abdominal pain, uncontrolled pain, chest pain,  shortness of breath or any other alarming symptoms.  Please follow-up your doctor as instructed in a week time and call the office for appointment.  Please avoid alcohol, smoking, or any other illicit  substance and maintain healthy habits including taking your regular medications as prescribed.  You were cared for by a hospitalist during your hospital stay. If you have any questions about your discharge medications or the care you received while you were in the hospital after you are discharged, you can call the unit and ask to speak with the hospitalist on call if the hospitalist that took care of you is not available.  Once you are discharged, your primary care physician will handle any further medical issues. Please note that NO REFILLS for any discharge medications will be authorized once you are discharged, as it is imperative that you return to your primary care physician (or establish a relationship with a primary care physician if you do not have one) for your aftercare needs so that they can reassess your need for medications and monitor your lab values   Face-to-face encounter (required for Medicare/Medicaid patients)   Complete by: As directed    I Antonieta Pert certify that this patient is under my care and that I, or a nurse practitioner or physician's assistant working with me, had a face-to-face encounter that meets the physician face-to-face encounter requirements with this patient on 03/31/2022. The encounter with the patient was in whole, or in part for the following medical condition(s) which is the primary reason for home health care (List medical condition): Sigmoid diverticulitis, deconditioning   The encounter with the patient was in whole, or in part, for the following medical condition, which is the primary reason for home health care: Sigmoid diverticulitis, deconditioning   I certify that, based on my findings, the following services are medically necessary home health services: Physical therapy   Reason for Medically Necessary Home Health Services: Therapy- Therapeutic Exercises to Increase Strength and Endurance   My clinical findings support the need for the above services:  Unable to leave home safely without assistance and/or assistive device   Further, I certify that my clinical findings support that this patient is homebound due to: Unable to leave home safely without assistance   Home Health   Complete by: As directed    To provide the following care/treatments:  PT OT     Increase activity slowly   Complete by: As directed       Allergies as of 03/31/2022       Reactions   Asa [aspirin] Other (See Comments)   GI upset   Bextra [valdecoxib] Other (See Comments)   GI upset   Celebrex [celecoxib] Other (See Comments)   GI upset   Codeine Nausea And Vomiting   Motrin [ibuprofen] Other (See Comments)   GI upset   Naprosyn [naproxen] Other (See Comments)   GI upset   Sulfa Antibiotics Rash   "hands blister"        Medication List     TAKE these medications    acetaminophen 500 MG tablet Commonly known as: TYLENOL Take 1,000 mg by mouth every 6 (six) hours as needed. For pain   amoxicillin-clavulanate 875-125 MG tablet Commonly known as: AUGMENTIN Take 1 tablet by mouth 2 (two) times daily for 7 days.   apixaban 5 MG Tabs tablet Commonly known as: ELIQUIS Take 2.5 mg by mouth 2 (two) times daily.   clorazepate 7.5 MG tablet Commonly known as: TRANXENE Take 7.5 mg by mouth at bedtime as needed for  sleep.   cromolyn 5.2 MG/ACT nasal spray Commonly known as: NASALCROM Place 1 spray into the nose daily as needed (nasal congestion).   famotidine 20 MG tablet Commonly known as: PEPCID Take 20 mg by mouth daily.   flecainide 50 MG tablet Commonly known as: TAMBOCOR Take 25-50 mg by mouth See admin instructions. 25 mg in the morning, 50 mg in the evening.   levothyroxine 112 MCG tablet Commonly known as: SYNTHROID Take 112 mcg by mouth at bedtime.   magnesium oxide 400 MG tablet Commonly known as: MAG-OX Take 400 mg by mouth daily.   Multi-Vitamin tablet Take 1 tablet by mouth daily.   pantoprazole 40 MG tablet Commonly  known as: PROTONIX Take 1 tablet (40 mg total) by mouth daily. Start taking on: April 01, 2022   sucralfate 1 GM/10ML suspension Commonly known as: CARAFATE Take 1 g by mouth daily as needed (severe acid reflux).   vancomycin 125 MG capsule Commonly known as: VANCOCIN Take 1 capsule (125 mg total) by mouth 4 (four) times daily for 13 days.   VITAMIN B-12 PO Take 1 tablet by mouth daily.   Vitamin D3 50 MCG (2000 UT) capsule Take 2,000 Units by mouth daily.   Xalatan 0.005 % ophthalmic solution Generic drug: latanoprost Place 1 drop into both eyes at bedtime.        Follow-up Information     Elijio Miles., MD Follow up in 1 week(s).   Specialty: Family Medicine Contact information: [address] [place] [address] [phone]         Sydnee Cabal., MD. Call in 1 week(s).   Specialty: Gastroenterology Contact information: [address] [place] [address]                Allergies  Allergen Reactions   Asa [Aspirin] Other (See Comments)    GI upset   Bextra [Valdecoxib] Other (See Comments)    GI upset    Celebrex [Celecoxib] Other (See Comments)    GI upset    Codeine Nausea And Vomiting   Motrin [Ibuprofen] Other (See Comments)    GI upset   Naprosyn [Naproxen] Other (See Comments)    GI upset    Sulfa Antibiotics Rash    "hands blister"    The results of significant diagnostics from this hospitalization (including imaging, microbiology, ancillary and laboratory) are listed below for reference.    Microbiology: Recent Results (from the past 240 hour(s))  C Difficile Quick Screen w PCR reflex     Status: Abnormal   Collection Time: 03/29/22 11:21 AM   Specimen: STOOL  Result Value Ref Range Status   C Diff antigen POSITIVE (A) NEGATIVE Final   C Diff toxin NEGATIVE NEGATIVE Final   C Diff interpretation Results are indeterminate. See PCR results.  Final    Comment: Performed at [facility], [address] [place], [place] [address]  C. Diff by PCR, Reflexed     Status: Abnormal   Collection Time: 03/29/22 11:21 AM  Result Value Ref Range Status   Toxigenic C. Difficile by PCR POSITIVE (A) NEGATIVE Final    Comment: Positive for toxigenic C. difficile with little to no toxin production. Only treat if clinical presentation suggests symptomatic illness. Performed at [facility], [address] [place], [place] [address]     Procedures/Studies: CT Abdomen Pelvis W Contrast  Result Date: 03/27/2022 CLINICAL DATA:  Left lower quadrant abdominal pain.  EXAM: CT ABDOMEN AND PELVIS WITH CONTRAST TECHNIQUE: Multidetector CT imaging of the abdomen and pelvis was performed using the standard protocol following bolus administration of intravenous contrast. RADIATION DOSE REDUCTION: This exam was performed according to the departmental dose-optimization program which includes automated exposure control, adjustment of the mA and/or kV according to patient size and/or use of iterative reconstruction technique. CONTRAST:  OMNIPAQUE IOHEXOL 300 MG/ML  SOLN COMPARISON:  CT abdomen pelvis dated 11/07/2021. FINDINGS: Lower chest: The visualized lung bases are clear. There is coronary vascular calcification. No intra-abdominal free air or free fluid. Hepatobiliary: Mild fatty liver. There is dilatation of the biliary trees, likely post cholecystectomy. No retained calcified stone noted in the central CBD. Pancreas: Unremarkable. No pancreatic ductal dilatation or surrounding inflammatory changes. Spleen: Normal in size without focal abnormality. Adrenals/Urinary Tract: The adrenal glands unremarkable. Subcentimeter right renal hypodense lesions are too small to characterize. There is no hydronephrosis on either side. There is symmetric enhancement and excretion of contrast by both kidneys. The visualized ureters and urinary bladder appear unremarkable. Stomach/Bowel: There is sigmoid  diverticulosis. There is inflammatory changes and stranding adjacent to a sigmoid diverticula in the distal descending/sigmoid junction consistent with acute diverticulitis. No diverticular abscess or perforation. There is loose stool throughout the colon. There is no bowel obstruction. The appendix is not visualized with certainty. No inflammatory changes identified in the right lower quadrant. Vascular/Lymphatic: Moderate aortoiliac atherosclerotic disease. The IVC is unremarkable. No portal venous gas. There is no adenopathy. Reproductive: Hysterectomy.  No adnexal masses. Other: Midline vertical anterior pelvic wall incisional scar. Musculoskeletal: Osteopenia with degenerative changes of the spine. No acute osseous pathology. IMPRESSION: 1. Acute sigmoid diverticulitis. No diverticular abscess or perforation. 2. No bowel obstruction. 3. Mild fatty liver. 4.  Aortic Atherosclerosis (ICD10-I70.0). Electronically Signed   By: Elgie Collard M.D.   On: 03/27/2022 19:08    Labs: BNP (last 3 results) No results for input(s): "BNP" in the last 8760 hours. Basic Metabolic Panel: Recent Labs  Lab 03/27/22 1503 03/28/22 0436 03/29/22 0742 03/30/22 0437 03/31/22 0442  NA 136 139 141 141 140  K 3.7 3.7 4.0 3.7 3.4*  CL 103 107 111 112* 110  CO2 24 26 26 23 23   GLUCOSE 145* 108* 99 101* 92  BUN 12 11 10  7* 7*  CREATININE 0.84 0.84 0.85 0.81 0.78  CALCIUM 8.6* 8.0* 8.0* 8.0* 8.3*  MG  --  1.9  --   --   --   PHOS  --  3.2  --   --   --    Liver Function Tests: Recent Labs  Lab 03/27/22 1503 03/28/22 0436 03/29/22 0742 03/30/22 0437 03/31/22 0442  AST 28 120* 49* 29 23  ALT 18 79* 51* 37 29  ALKPHOS 75 61 67 60 50  BILITOT 0.8 1.1 0.9 0.4 0.3  PROT 6.6 5.4* 5.6* 5.2* 5.0*  ALBUMIN 3.9 3.2* 3.2* 2.9* 2.9*   Recent Labs  Lab 03/27/22 1503  LIPASE 27   No results for input(s): "AMMONIA" in the last 168 hours. CBC: Recent Labs  Lab 03/27/22 1503 03/28/22 0436 03/30/22 0437  03/31/22 0442  WBC 10.5 7.9 5.0 5.3  NEUTROABS  --  5.0  --   --   HGB 13.4 11.2* 10.4* 10.2*  HCT 41.1 34.3* 32.4* 31.5*  MCV 92.4 93.7 94.5 94.9  PLT 251 179 169 199  Urinalysis    Component Value Date/Time   COLORURINE YELLOW 03/27/2022 1503   APPEARANCEUR CLEAR 03/27/2022  1503   LABSPEC 1.025 03/27/2022 1503   PHURINE 5.5 03/27/2022 1503   GLUCOSEU NEGATIVE 03/27/2022 1503   HGBUR NEGATIVE 03/27/2022 1503   BILIRUBINUR NEGATIVE 03/27/2022 1503   KETONESUR NEGATIVE 03/27/2022 1503   PROTEINUR NEGATIVE 03/27/2022 1503   UROBILINOGEN 0.2 04/04/2011 1942   NITRITE NEGATIVE 03/27/2022 1503   LEUKOCYTESUR NEGATIVE 03/27/2022 1503   Sepsis Labs Recent Labs  Lab 03/27/22 1503 03/28/22 0436 03/30/22 0437 03/31/22 0442  WBC 10.5 7.9 5.0 5.3   Microbiology Recent Results (from the past 240 hour(s))  C Difficile Quick Screen w PCR reflex     Status: Abnormal   Collection Time: 03/29/22 11:21 AM   Specimen: STOOL  Result Value Ref Range Status   C Diff antigen POSITIVE (A) NEGATIVE Final   C Diff toxin NEGATIVE NEGATIVE Final   C Diff interpretation Results are indeterminate. See PCR results.  Final    Comment: Performed at [facility], [address] [place], [place] [address]  C. Diff by PCR, Reflexed     Status: Abnormal   Collection Time: 03/29/22 11:21 AM  Result Value Ref Range Status   Toxigenic C. Difficile by PCR POSITIVE (A) NEGATIVE Final    Comment: Positive for toxigenic C. difficile with little to no toxin production. Only treat if clinical presentation suggests symptomatic illness. Performed at [facility], [address] [place], [place] [address]   Time coordinating discharge: 25 minutes SIGNED: Lanae Boastamesh Reniya Mcclees, MD  [facility] 03/31/2022, 2:54 PM  If 7PM-7AM, please contact night-coverage [email or web address]

## 2022-03-31 NOTE — Progress Notes (Signed)
Mobility Specialist - Progress Note   03/31/22 1417  Mobility  Activity Ambulated with assistance in hallway  Level of Assistance Modified independent, requires aide device or extra time  Assistive Device [facility] Ambulated (ft) 500 ft  Range of Motion/Exercises Active  Activity Response Tolerated well  Mobility Referral Yes  $Mobility charge 1 Mobility   Pt received in recliner w/ son in room and agreeable to mobility. No complaints during mobility. Pt to recliner after session with all needs met.     [facility]

## 2022-03-31 NOTE — Progress Notes (Signed)
Mobility Specialist - Progress Note   03/31/22 1049  Mobility  Activity Ambulated with assistance in hallway  Level of Assistance Independent after set-up  Assistive Device Cane  Distance Ambulated (ft) 500 ft  Activity Response Tolerated well  Mobility Referral Yes  $Mobility charge 1 Mobility   Pt received in bed and agreeable to mobility. No complaints during mobility.  Pt to recliner after session with all needs met.     [facility]

## 2022-03-31 NOTE — Progress Notes (Signed)
Pt had been having severe acid reflux overnight but denies chest pain. Carafate given per order.

## 2022-04-03 ENCOUNTER — Other Ambulatory Visit (HOSPITAL_COMMUNITY): Payer: Self-pay

## 2022-04-04 ENCOUNTER — Other Ambulatory Visit (HOSPITAL_COMMUNITY): Payer: Self-pay

## 2022-04-05 NOTE — Telephone Encounter (Signed)
ERROR

## 2022-05-24 ENCOUNTER — Emergency Department (HOSPITAL_BASED_OUTPATIENT_CLINIC_OR_DEPARTMENT_OTHER)
Admission: EM | Admit: 2022-05-24 | Discharge: 2022-05-24 | Disposition: A | Discharge status: Home / Self Care | Payer: Medicare Other | Attending: Emergency Medicine | Admitting: Emergency Medicine

## 2022-05-24 ENCOUNTER — Other Ambulatory Visit: Payer: Self-pay

## 2022-05-24 ENCOUNTER — Emergency Department (HOSPITAL_BASED_OUTPATIENT_CLINIC_OR_DEPARTMENT_OTHER): Payer: Medicare Other

## 2022-05-24 DIAGNOSIS — Z1152 Encounter for screening for COVID-19: Secondary | ICD-10-CM | POA: Diagnosis not present

## 2022-05-24 DIAGNOSIS — Z7901 Long term (current) use of anticoagulants: Secondary | ICD-10-CM | POA: Insufficient documentation

## 2022-05-24 DIAGNOSIS — R0981 Nasal congestion: Secondary | ICD-10-CM | POA: Diagnosis present

## 2022-05-24 DIAGNOSIS — J069 Acute upper respiratory infection, unspecified: Secondary | ICD-10-CM | POA: Diagnosis not present

## 2022-05-24 LAB — RESP PANEL BY RT-PCR (RSV, FLU A&B, COVID)  RVPGX2
Influenza A by PCR: NEGATIVE
Influenza B by PCR: NEGATIVE
Resp Syncytial Virus by PCR: NEGATIVE
SARS Coronavirus 2 by RT PCR: NEGATIVE

## 2022-05-24 MED ORDER — GUAIFENESIN-DM 100-10 MG/5ML PO SYRP: 5.0000 mL | ORAL_SOLUTION | Freq: Three times a day (TID) | ORAL | 0 refills | Status: AC | PRN

## 2022-05-24 NOTE — ED Provider Notes (Signed)
[facility] EMERGENCY DEPARTMENT Provider Note   CSN: [id number] Arrival date & time: 05/24/22  1146     History  Chief Complaint  Patient presents with   Nasal Congestion    Vanessa Fernandez is a 88 y.o. female.  HPI Patient presents with cough and congestion that has had for the last couple days.  Not severely short of breath.  No fevers.  Was around family on Christmas with today being the 27th, however known definite contacts.  No swelling her legs.  Did have pneumonia few months ago.   Past Medical History:  Diagnosis Date   Depression    Diverticulitis    GERD (gastroesophageal reflux disease)    Glaucoma    Thyroid disease     Home Medications Prior to Admission medications   Medication Sig Start Date End Date Taking? Authorizing Provider  guaiFENesin-dextromethorphan (ROBITUSSIN DM) 100-10 MG/5ML syrup Take 5 mLs by mouth 3 (three) times daily as needed for cough. 05/24/22  Yes Benjiman Core, MD  acetaminophen (TYLENOL) 500 MG tablet Take 1,000 mg by mouth every 6 (six) hours as needed. For pain     [provider]  apixaban (ELIQUIS) 5 MG TABS tablet Take 2.5 mg by mouth 2 (two) times daily. 07/21/19   [provider]  Cholecalciferol (VITAMIN D3) 50 MCG (2000 UT) capsule Take 2,000 Units by mouth daily.    [provider]  clorazepate (TRANXENE) 7.5 MG tablet Take 7.5 mg by mouth at bedtime as needed for sleep.    [provider]  cromolyn (NASALCROM) 5.2 MG/ACT nasal spray Place 1 spray into the nose daily as needed (nasal congestion).    [provider]  Cyanocobalamin (VITAMIN B-12 PO) Take 1 tablet by mouth daily.    [provider]  famotidine (PEPCID) 20 MG tablet Take 20 mg by mouth daily.    [provider]  flecainide (TAMBOCOR) 50 MG tablet Take 25-50 mg by mouth See admin instructions. 25 mg in the morning, 50 mg in the evening.    [provider]  latanoprost (XALATAN)  0.005 % ophthalmic solution Place 1 drop into both eyes at bedtime. 05/30/19   [provider]  levothyroxine (SYNTHROID) 112 MCG tablet Take 112 mcg by mouth at bedtime. 12/29/19   [provider]  magnesium oxide (MAG-OX) 400 MG tablet Take 400 mg by mouth daily.    [provider]  Multiple Vitamin (MULTI-VITAMIN) tablet Take 1 tablet by mouth daily.    [provider]  pantoprazole (PROTONIX) 40 MG tablet Take 1 tablet (40 mg total) by mouth daily. 04/01/22 05/01/22  Lanae Boast, MD  sucralfate (CARAFATE) 1 GM/10ML suspension Take 1 g by mouth daily as needed (severe acid reflux).    [provider]      Allergies    Asa [aspirin], Bextra [valdecoxib], Celebrex [celecoxib], Codeine, Motrin [ibuprofen], Naprosyn [naproxen], and Sulfa antibiotics    Review of Systems   Review of Systems  Physical Exam Updated Vital Signs BP (!) 144/61 (BP Location: Left Arm)   Pulse 89   Temp 98 F (36.7 C) (Oral)   Resp 16   SpO2 95%  Physical Exam Vitals and nursing note reviewed.  HENT:     Head: Atraumatic.  Eyes:     Pupils: Pupils are equal, round, and reactive to light.  Cardiovascular:     Rate and Rhythm: Regular rhythm.  Pulmonary:     Comments: Mildly harsh breath sounds without focal rales  or rhonchi. Musculoskeletal:        General: No tenderness.     Cervical back: Neck supple.  Neurological:     Mental Status: She is alert.     ED Results / Procedures / Treatments   Labs (all labs ordered are listed, but only abnormal results are displayed) Labs Reviewed  RESP PANEL BY RT-PCR (RSV, FLU A&B, COVID)  [id number]    EKG None  Radiology DG Chest 2 View  Result Date: 05/24/2022 CLINICAL DATA:  Congestion and cough this morning, sore throat for 2 days, remote former smoker EXAM: CHEST - 2 VIEW COMPARISON:  11/30/2021 FINDINGS: Normal heart size, mediastinal contours, and pulmonary vascularity. Atherosclerotic calcification aorta.  Bronchitic changes without infiltrate, pleural effusion, or pneumothorax. Eventration anterior RIGHT diaphragm stable. Bones demineralized. IMPRESSION: Minimal chronic bronchitic changes. No acute abnormalities. Aortic Atherosclerosis (ICD10-I70.0). Electronically Signed   By: Lavonia Dana M.D.   On: 05/24/2022 12:44    Procedures Procedures    Medications Ordered in ED Medications - No data to display  ED Course/ Medical Decision Making/ A&P                           Medical Decision Making Amount and/or Complexity of Data Reviewed Radiology: ordered.  Risk OTC drugs.   Patient with cough.  No fevers.  No definite sick contacts.  Well-appearing.  Not hypoxic.  Differential diagnosis includes pneumonia, bronchitis.  Flu COVID and RSV tested negative.  Chest x-ray is not pneumonia.  Lung exam does not show focal findings to think a occult pneumonia.  Not hypoxic.  Will discharge home with symptomatic treatment.  Will return for worsening symptoms.        Final Clinical Impression(s) / ED Diagnoses Final diagnoses:  Upper respiratory tract infection, unspecified type    Rx / DC Orders ED Discharge Orders          Ordered    guaiFENesin-dextromethorphan (ROBITUSSIN DM) 100-10 MG/5ML syrup  3 times daily PRN        05/24/22 1354              Davonna Belling, MD 05/24/22 1404

## 2022-05-24 NOTE — ED Notes (Signed)
Discharge instructions reviewed with patient. Patient verbalizes understanding, no further questions at this time. Medications/prescriptions and follow up information provided. No acute distress noted at time of departure.  

## 2022-05-24 NOTE — ED Triage Notes (Signed)
Patient presents to ED via POV from home. Here with cough, sore throat and congestion.

## 2022-10-13 ENCOUNTER — Encounter (HOSPITAL_BASED_OUTPATIENT_CLINIC_OR_DEPARTMENT_OTHER): Payer: Self-pay

## 2022-10-13 ENCOUNTER — Other Ambulatory Visit: Payer: Self-pay

## 2022-10-13 ENCOUNTER — Emergency Department (HOSPITAL_BASED_OUTPATIENT_CLINIC_OR_DEPARTMENT_OTHER)
Admission: EM | Admit: 2022-10-13 | Discharge: 2022-10-13 | Disposition: A | Discharge status: Home / Self Care | Payer: Medicare Other | Attending: Emergency Medicine | Admitting: Emergency Medicine

## 2022-10-13 DIAGNOSIS — R42 Dizziness and giddiness: Secondary | ICD-10-CM | POA: Insufficient documentation

## 2022-10-13 DIAGNOSIS — R1012 Left upper quadrant pain: Secondary | ICD-10-CM | POA: Insufficient documentation

## 2022-10-13 DIAGNOSIS — R110 Nausea: Secondary | ICD-10-CM | POA: Insufficient documentation

## 2022-10-13 DIAGNOSIS — Z7901 Long term (current) use of anticoagulants: Secondary | ICD-10-CM | POA: Diagnosis not present

## 2022-10-13 LAB — COMPREHENSIVE METABOLIC PANEL
ALT: 18 U/L (ref 0–44)
AST: 28 U/L (ref 15–41)
Albumin: 3.6 g/dL (ref 3.5–5.0)
Alkaline Phosphatase: 60 U/L (ref 38–126)
Anion gap: 6 (ref 5–15)
BUN: 13 mg/dL (ref 8–23)
CO2: 28 mmol/L (ref 22–32)
Calcium: 8.6 mg/dL — ABNORMAL LOW (ref 8.9–10.3)
Chloride: 103 mmol/L (ref 98–111)
Creatinine, Ser: 0.94 mg/dL (ref 0.44–1.00)
GFR, Estimated: 56 mL/min — ABNORMAL LOW (ref >60)
Glucose, Bld: 90 mg/dL (ref 70–99)
Potassium: 4.3 mmol/L (ref 3.5–5.1)
Sodium: 137 mmol/L (ref 135–145)
Total Bilirubin: 0.6 mg/dL (ref 0.3–1.2)
Total Protein: 6.3 g/dL — ABNORMAL LOW (ref 6.5–8.1)

## 2022-10-13 LAB — CBC WITH DIFFERENTIAL/PLATELET
Abs Immature Granulocytes: 0.02 10*3/uL (ref 0.00–0.07)
Basophils Absolute: 0 10*3/uL (ref 0.0–0.1)
Basophils Relative: 1 %
Eosinophils Absolute: 0.1 10*3/uL (ref 0.0–0.5)
Eosinophils Relative: 2 %
HCT: 38.5 % (ref 36.0–46.0)
Hemoglobin: 12.7 g/dL (ref 12.0–15.0)
Immature Granulocytes: 0 %
Lymphocytes Relative: 33 %
Lymphs Abs: 2.4 10*3/uL (ref 0.7–4.0)
MCH: 31 pg (ref 26.0–34.0)
MCHC: 33 g/dL (ref 30.0–36.0)
MCV: 93.9 fL (ref 80.0–100.0)
Monocytes Absolute: 0.6 10*3/uL (ref 0.1–1.0)
Monocytes Relative: 9 %
Neutro Abs: 3.9 10*3/uL (ref 1.7–7.7)
Neutrophils Relative %: 55 %
Platelets: 262 10*3/uL (ref 150–400)
RBC: 4.1 MIL/uL (ref 3.87–5.11)
RDW: 13.2 % (ref 11.5–15.5)
WBC: 7.1 10*3/uL (ref 4.0–10.5)
nRBC: 0 % (ref 0.0–0.2)

## 2022-10-13 LAB — LIPASE, BLOOD: Lipase: 26 U/L (ref 11–51)

## 2022-10-13 MED ORDER — ONDANSETRON HCL 4 MG/2ML IJ SOLN
4.0000 mg | Freq: Once | INTRAMUSCULAR | Status: AC
  Administered 2022-10-13: 4 mg via INTRAVENOUS
  Filled 2022-10-13: qty 2

## 2022-10-13 MED ORDER — SODIUM CHLORIDE 0.9 % IV BOLUS
500.0000 mL | Freq: Once | INTRAVENOUS | Status: AC
  Administered 2022-10-13: 500 mL via INTRAVENOUS

## 2022-10-13 NOTE — ED Notes (Signed)
Discharge paperwork reviewed entirely with patient, including follow up care. Pain was under control. No prescriptions were called in, but all questions were addressed.  Pt verbalized understanding as well as all parties involved. No questions or concerns voiced at the time of discharge. No acute distress noted.   Pt ambulated out to PVA without incident or assistance.  

## 2022-10-13 NOTE — ED Triage Notes (Signed)
Patient has had chills for two days. Then today nausea and dizziness.

## 2022-10-13 NOTE — ED Provider Notes (Signed)
[place] [facility] Provider Note   CSN: [id number] Arrival date & time: 10/13/22  1359     History  Chief Complaint  Patient presents with   Dizziness   Chills    Vanessa Fernandez is a 89 y.o. female.   Dizziness Patient presents with nausea and dizziness.  Has felt bad for 2 days.  States she has had chills but no fever.  States she feels dizzy.  States it does not feel like she is moving unsteady or can a pass out.  Just "dizzy".  Has had nausea.  Somewhat decreased oral intake.  No dysuria.  Does have some left-sided upper abdominal pain.  Recent increase in her Pepcid dose.   Past Medical History:  Diagnosis Date   Depression    Diverticulitis    GERD (gastroesophageal reflux disease)    Glaucoma    Thyroid disease     Home Medications Prior to Admission medications   Medication Sig Start Date End Date Taking? Authorizing Provider  acetaminophen (TYLENOL) 500 MG tablet Take 1,000 mg by mouth every 6 (six) hours as needed. For pain     [provider]  apixaban (ELIQUIS) 5 MG TABS tablet Take 2.5 mg by mouth 2 (two) times daily. 07/21/19   [provider]  Cholecalciferol (VITAMIN D3) 50 MCG (2000 UT) capsule Take 2,000 Units by mouth daily.    [provider]  clorazepate (TRANXENE) 7.5 MG tablet Take 7.5 mg by mouth at bedtime as needed for sleep.    [provider]  cromolyn (NASALCROM) 5.2 MG/ACT nasal spray Place 1 spray into the nose daily as needed (nasal congestion).    [provider]  Cyanocobalamin (VITAMIN B-12 PO) Take 1 tablet by mouth daily.    [provider]  famotidine (PEPCID) 20 MG tablet Take 20 mg by mouth daily.    [provider]  flecainide (TAMBOCOR) 50 MG tablet Take 25-50 mg by mouth See admin instructions. 25 mg in the morning, 50 mg in the evening.    [provider]  guaiFENesin-dextromethorphan (ROBITUSSIN DM) 100-10 MG/5ML syrup  Take 5 mLs by mouth 3 (three) times daily as needed for cough. 05/24/22   Benjiman Core, MD  latanoprost (XALATAN) 0.005 % ophthalmic solution Place 1 drop into both eyes at bedtime. 05/30/19   [provider]  levothyroxine (SYNTHROID) 112 MCG tablet Take 112 mcg by mouth at bedtime. 12/29/19   [provider]  magnesium oxide (MAG-OX) 400 MG tablet Take 400 mg by mouth daily.    [provider]  Multiple Vitamin (MULTI-VITAMIN) tablet Take 1 tablet by mouth daily.    [provider]  pantoprazole (PROTONIX) 40 MG tablet Take 1 tablet (40 mg total) by mouth daily. 04/01/22 05/01/22  Lanae Boast, MD  sucralfate (CARAFATE) 1 GM/10ML suspension Take 1 g by mouth daily as needed (severe acid reflux).    [provider]      Allergies    Asa [aspirin], Bextra [valdecoxib], Celebrex [celecoxib], Codeine, Motrin [ibuprofen], Naprosyn [naproxen], and Sulfa antibiotics    Review of Systems   Review of Systems  Neurological:  Positive for dizziness.    Physical Exam Updated Vital Signs BP (!) 158/96 (BP Location: Left Arm)   Pulse 80   Temp 97.7 F (36.5 C) (Oral)   Resp 17   Ht 5\' 4"  (1.626 m)   Wt 86.6 kg   SpO2 99%   BMI 32.79 kg/m  Physical  Exam Vitals and nursing note reviewed.  Cardiovascular:     Rate and Rhythm: Regular rhythm.  Abdominal:     Tenderness: There is no abdominal tenderness.     Comments: Mild left upper quadrant tenderness without rebound or guarding.  No hernia palpated.  Musculoskeletal:     Cervical back: Neck supple.  Neurological:     Mental Status: She is alert and oriented to person, place, and time.     ED Results / Procedures / Treatments   Labs (all labs ordered are listed, but only abnormal results are displayed) Labs Reviewed  COMPREHENSIVE METABOLIC PANEL - Abnormal; Notable for the following components:      Result Value   Calcium 8.6 (*)    Total Protein 6.3 (*)    GFR, Estimated 56 (*)    All  other components within normal limits  LIPASE, BLOOD  CBC WITH DIFFERENTIAL/PLATELET    EKG EKG Interpretation  Date/Time:  Friday Oct 13 2022 14:09:43 EDT Ventricular Rate:  82 PR Interval:  177 QRS Duration: 87 QT Interval:  408 QTC Calculation: 477 R Axis:   -16 Text Interpretation: Sinus rhythm Borderline left axis deviation Confirmed by Benjiman Core [phone]) on 10/13/2022 3:33:43 PM  Radiology No results found.  Procedures Procedures    Medications Ordered in ED Medications  ondansetron (ZOFRAN) injection 4 mg (4 mg Intravenous Given 10/13/22 1601)  sodium chloride 0.9 % bolus 500 mL (500 mLs Intravenous New Bag/Given 10/13/22 1610)    ED Course/ Medical Decision Making/ A&P                             Medical Decision Making Amount and/or Complexity of Data Reviewed Labs: ordered.  Risk Prescription drug management.   Patient with reported dizziness.  Has some nausea.  It does not sound like motion sickness or vertigo.  Does not sound near syncope.  Has had decreased appetite.  States she has had chills but denies fever.  Will get basic blood work.  No dysuria or urinary frequency.  No cough or shortness of breath.  Will give fluid bolus and some Zofran and check some basic blood work.  Blood work is reassuring.  White count normal.  Feels better after the fluid.  Actually is asking go home.  I think this is reasonable.  Only mild tenderness and doubt severe intra-abdominal pathology such as diverticulitis.  Appears stable for discharge home and can follow-up as needed.        Final Clinical Impression(s) / ED Diagnoses Final diagnoses:  Nausea    Rx / DC Orders ED Discharge Orders     None         Benjiman Core, MD 10/13/22 1734

## 2022-11-15 IMAGING — DX DG CHEST 1V PORT
1 series · 1 of 1 positions shown · non-contrast
Comparison: April 13, 2021.

CLINICAL DATA: Atypical chest pain.

EXAM:
PORTABLE CHEST 1 VIEW

[chest ap]
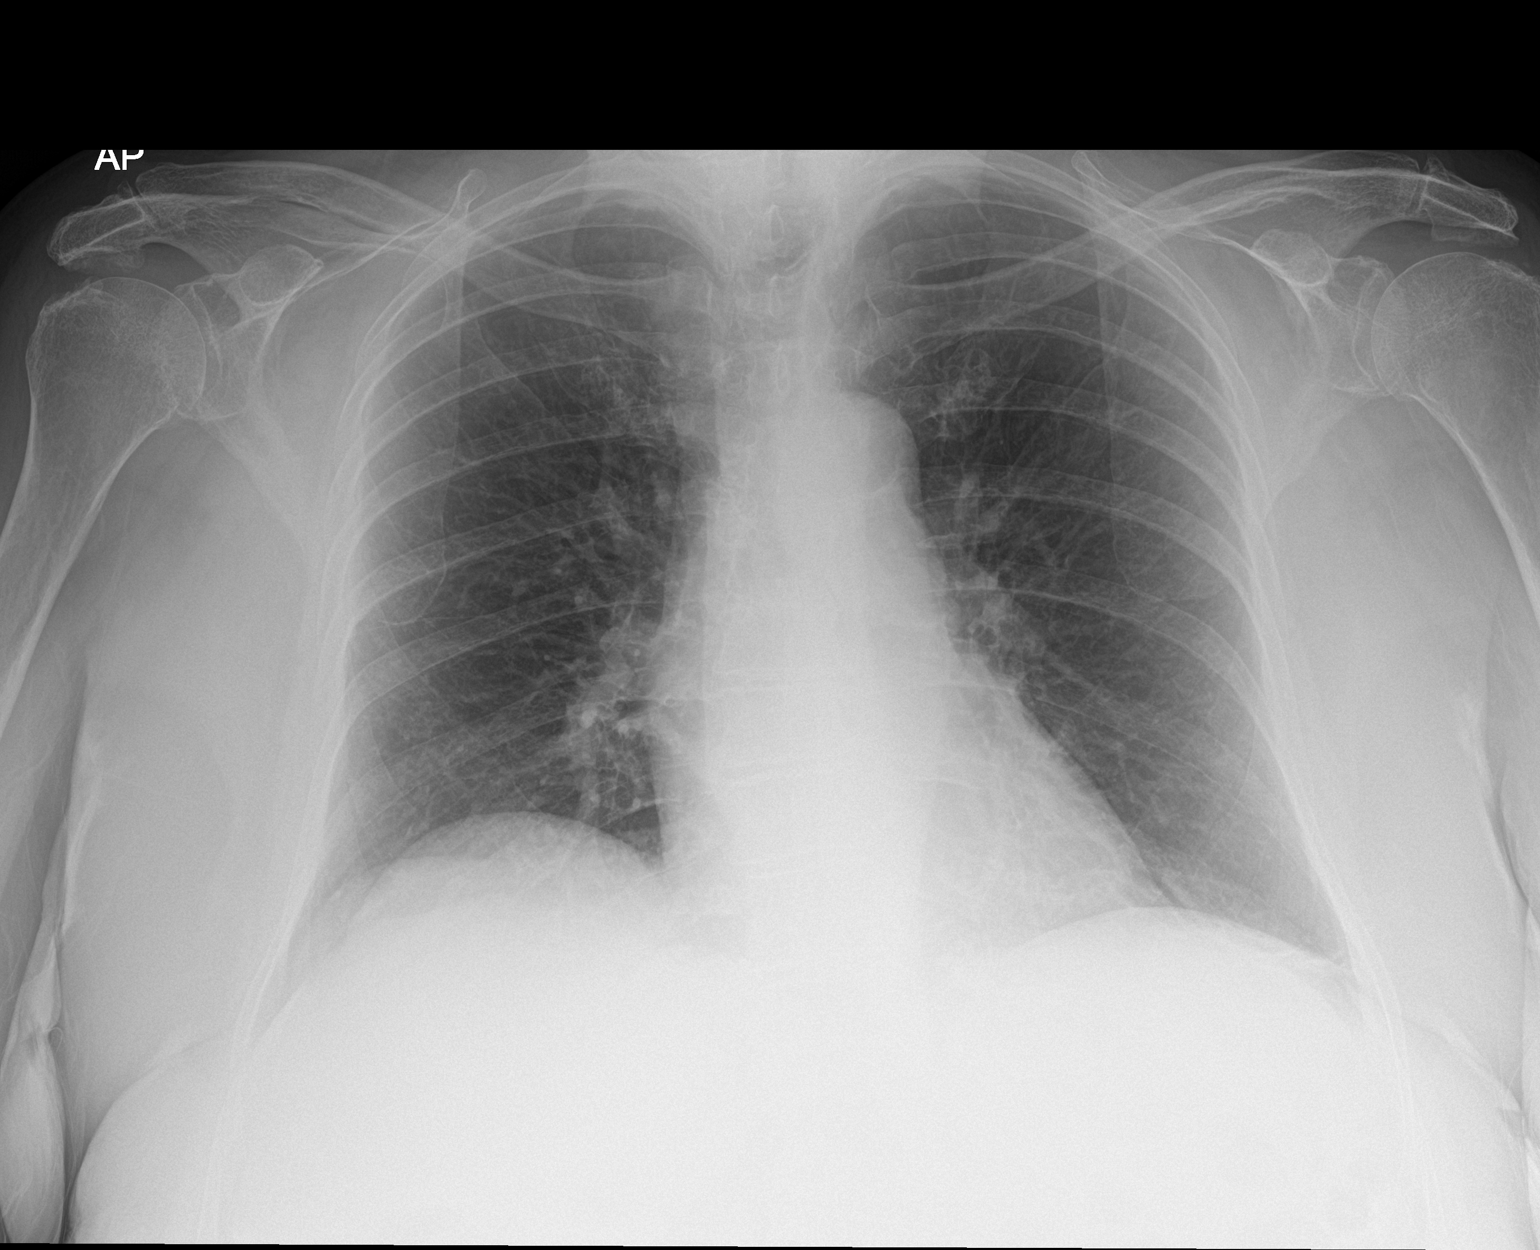

[1 of 1 positions shown; findings below may reference images not displayed]

FINDINGS: The heart size and mediastinal contours are within normal limits.
Both lungs are clear. No visible pleural effusions or pneumothorax.
The visualized skeletal structures are unremarkable.
IMPRESSION: No active disease.

## 2022-11-15 IMAGING — CT CT ABD-PELV W/ CM
2 of 5 series · 16 of 46 positions shown, 18 images · IV contrast (Omnipaque)
Comparison: 11/07/2020

CLINICAL DATA: Abdominal pain, acute, nonlocalized

EXAM:
CT ABDOMEN AND PELVIS WITH CONTRAST
TECHNIQUE: Multidetector CT imaging of the abdomen and pelvis was performed
using the standard protocol following bolus administration of
intravenous contrast.

[Series 2: axial st · axial · 0.96mm/px · z∈[-496,-66]mm · 13 of 98 slices shown, 15 images]
[im 6/98  soft-tissue]
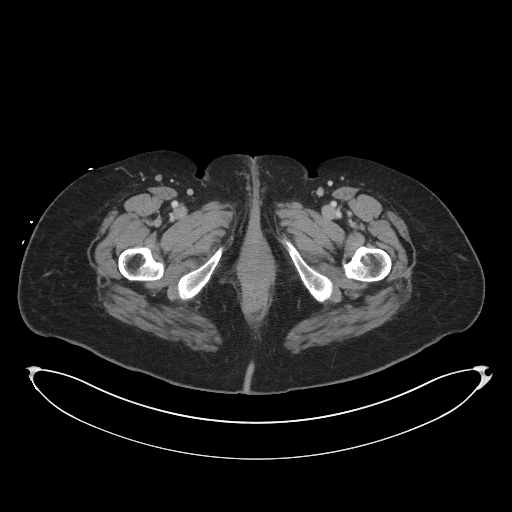
[im 6/98  bone]
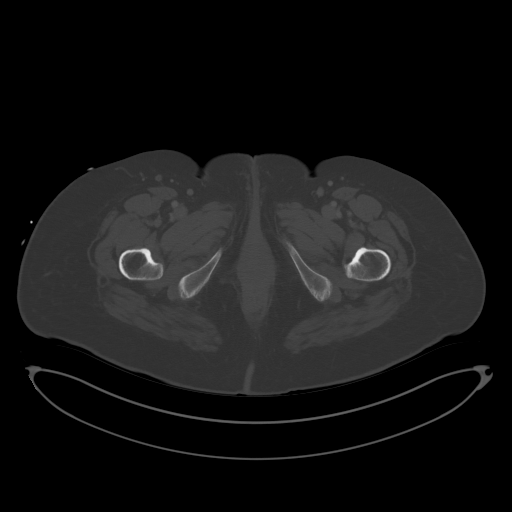
[im 11/98  soft-tissue]
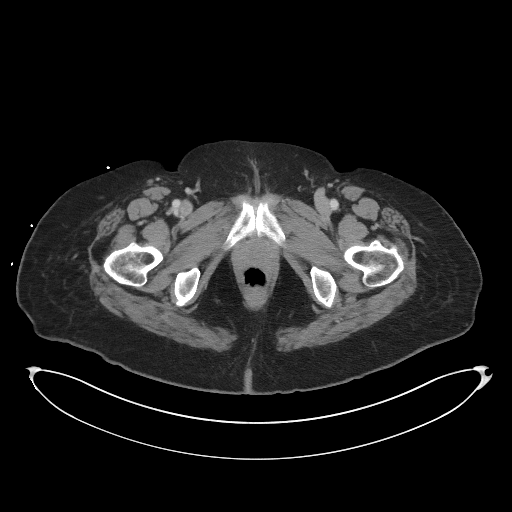
[im 22/98  soft-tissue]
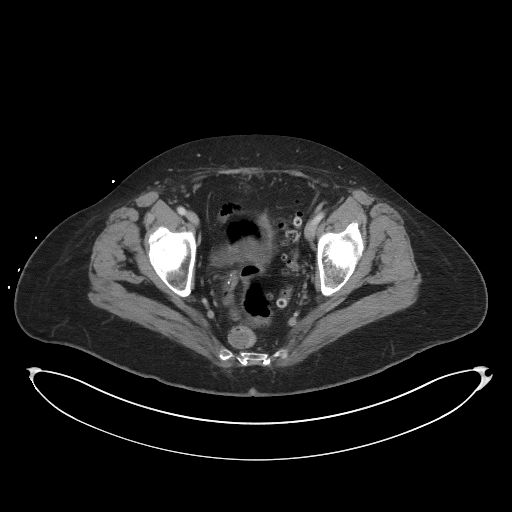
[im 27/98  soft-tissue]
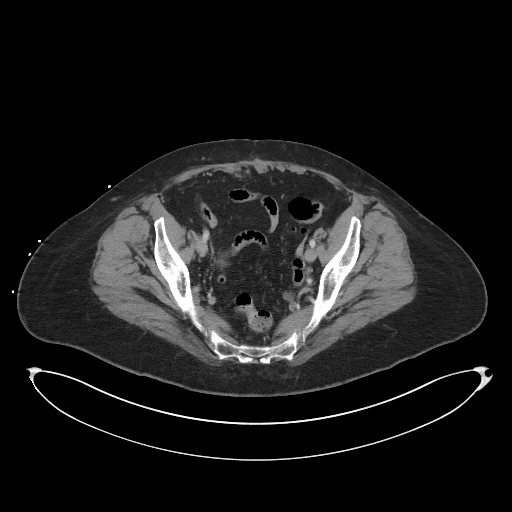
[im 33/98  soft-tissue]
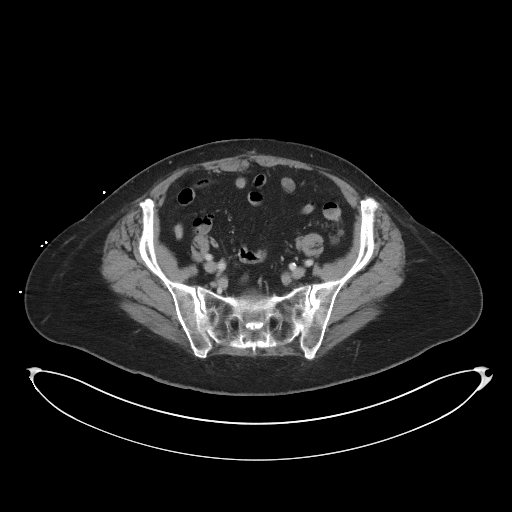
[im 44/98  soft-tissue]
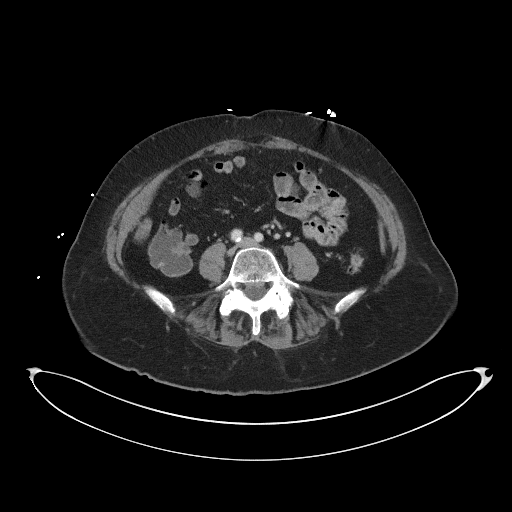
[im 49/98  soft-tissue]
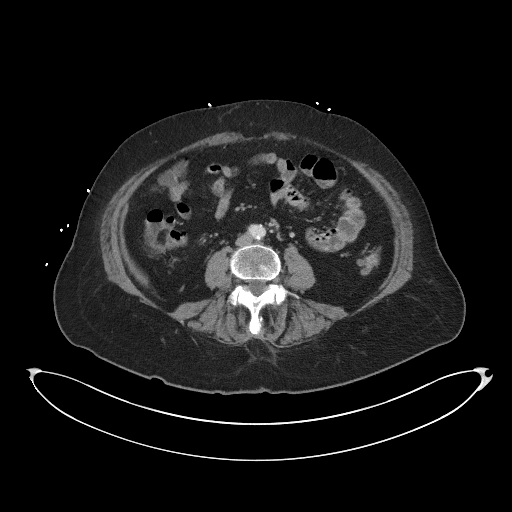
[im 54/98  soft-tissue]
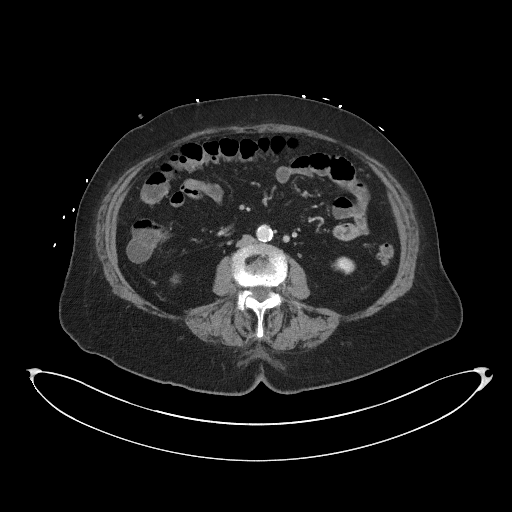
[im 65/98  soft-tissue]
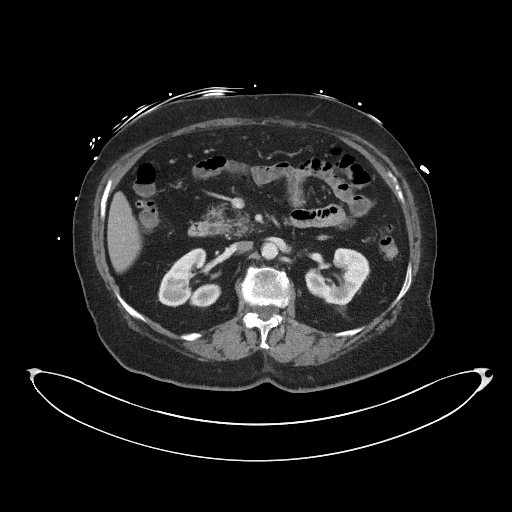
[im 65/98  bone]
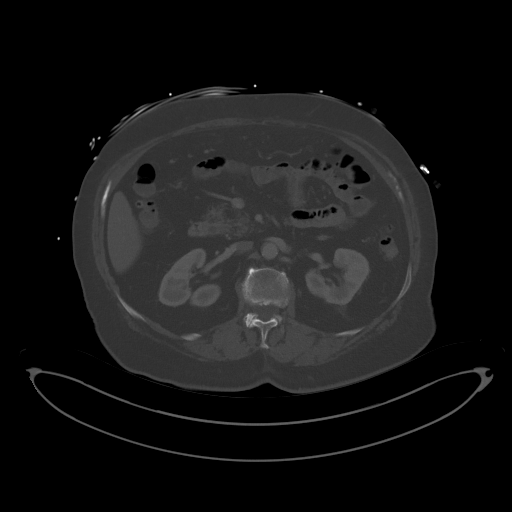
[im 71/98  soft-tissue]
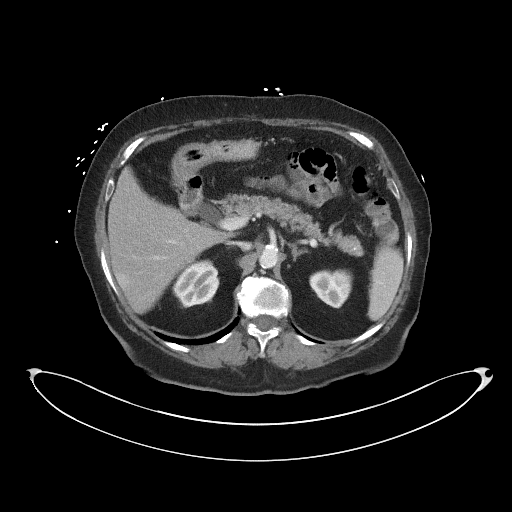
[im 76/98  soft-tissue]
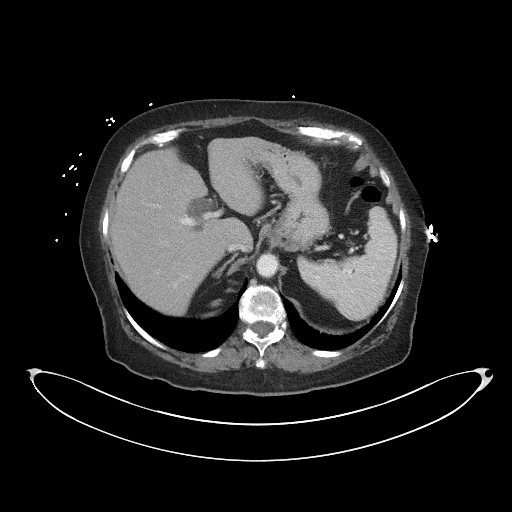
[im 87/98  soft-tissue]
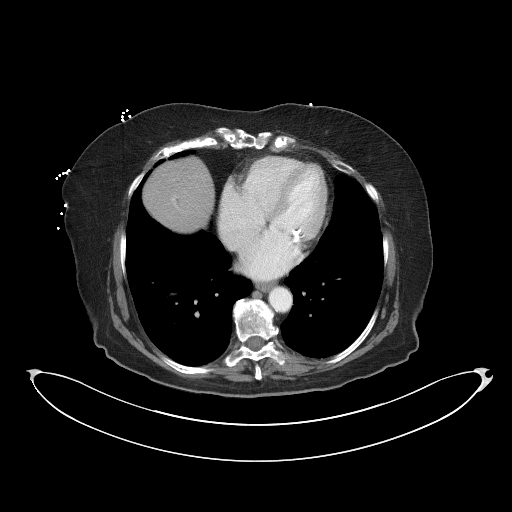
[im 92/98  soft-tissue]
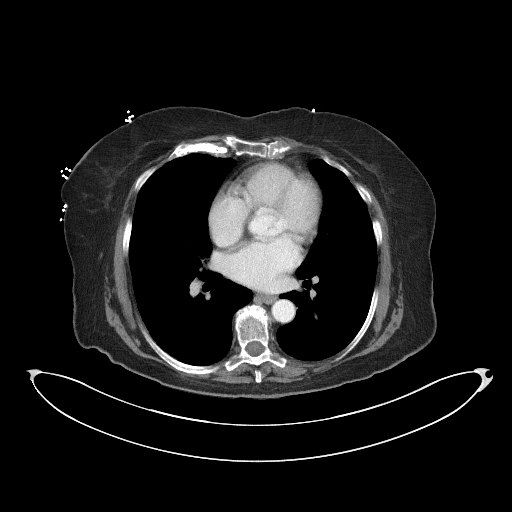

[Series 5: coronal st · coronal · 0.83mm/px · 3 of 108 slices shown]
[im 36/108  soft-tissue]
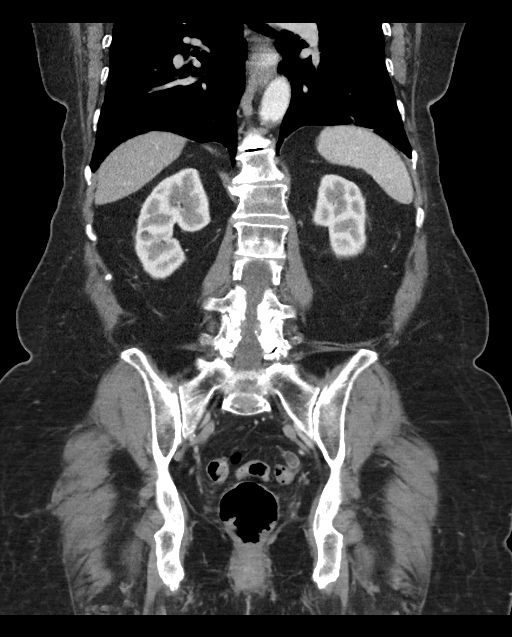
[im 48/108  soft-tissue]
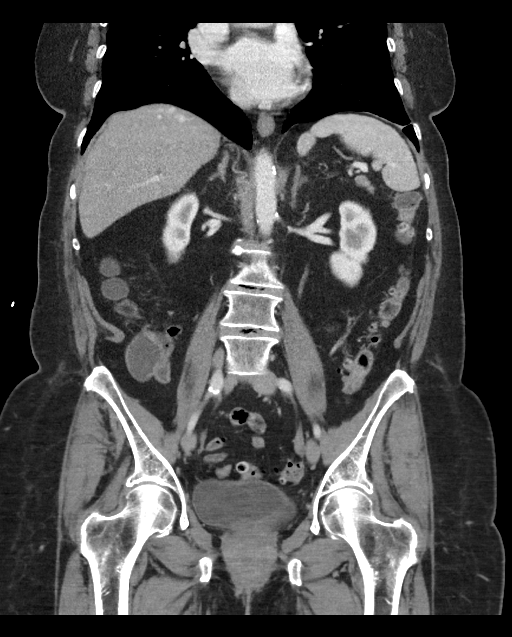
[im 60/108  soft-tissue]
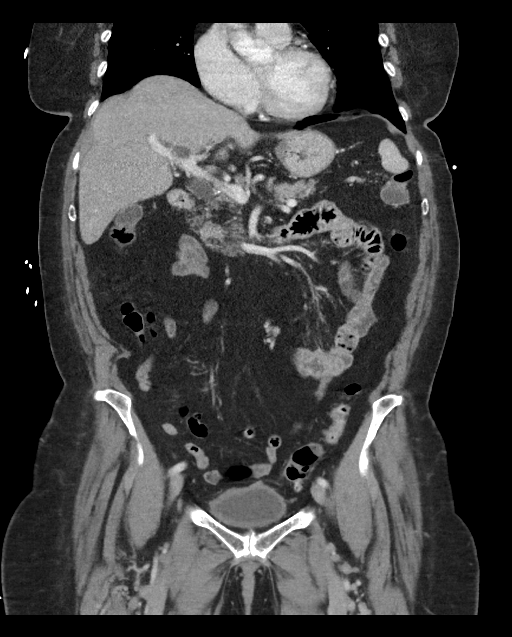

[16 of 46 positions shown; findings below may reference images not displayed]

RADIATION DOSE REDUCTION: This exam was performed according to the
departmental dose-optimization program which includes automated
exposure control, adjustment of the mA and/or kV according to
patient size and/or use of iterative reconstruction technique.

CONTRAST:  100mL OMNIPAQUE IOHEXOL 300 MG/ML  SOLN
FINDINGS: Lower chest: No acute abnormality

Hepatobiliary: Prior cholecystectomy. Intrahepatic and extrahepatic
biliary ductal dilatation likely related to patient's age and post
cholecystectomy state. This is stable since prior study.

Pancreas: No focal abnormality or ductal dilatation.

Spleen: No focal abnormality.  Normal size.

Adrenals/Urinary Tract: Small cysts in the right kidney. No stones
or hydronephrosis. No suspicious renal or adrenal mass. Urinary
bladder unremarkable.

Stomach/Bowel: Colonic diverticulosis. No active diverticulitis.
Stomach and small bowel decompressed, unremarkable.

Vascular/Lymphatic: Aortic atherosclerosis. No evidence of aneurysm
or adenopathy.

Reproductive: Prior hysterectomy.  No adnexal masses.

Other: No free fluid or free air.

Musculoskeletal: No acute bony abnormality.
IMPRESSION: No acute findings in the abdomen or pelvis.

Ancillary findings as above.

## 2022-11-28 ENCOUNTER — Emergency Department (HOSPITAL_BASED_OUTPATIENT_CLINIC_OR_DEPARTMENT_OTHER)
Admission: EM | Admit: 2022-11-28 | Discharge: 2022-11-28 | Disposition: A | Discharge status: Home / Self Care | Payer: Medicare Other | Attending: Emergency Medicine | Admitting: Emergency Medicine

## 2022-11-28 ENCOUNTER — Emergency Department (HOSPITAL_BASED_OUTPATIENT_CLINIC_OR_DEPARTMENT_OTHER): Payer: Medicare Other

## 2022-11-28 ENCOUNTER — Encounter (HOSPITAL_BASED_OUTPATIENT_CLINIC_OR_DEPARTMENT_OTHER): Payer: Self-pay | Admitting: Urology

## 2022-11-28 DIAGNOSIS — Z1152 Encounter for screening for COVID-19: Secondary | ICD-10-CM | POA: Insufficient documentation

## 2022-11-28 DIAGNOSIS — Z7901 Long term (current) use of anticoagulants: Secondary | ICD-10-CM | POA: Diagnosis not present

## 2022-11-28 DIAGNOSIS — R519 Headache, unspecified: Secondary | ICD-10-CM | POA: Insufficient documentation

## 2022-11-28 DIAGNOSIS — I10 Essential (primary) hypertension: Secondary | ICD-10-CM | POA: Diagnosis present

## 2022-11-28 LAB — CBC
HCT: 37.9 % (ref 36.0–46.0)
Hemoglobin: 12.4 g/dL (ref 12.0–15.0)
MCH: 30.5 pg (ref 26.0–34.0)
MCHC: 32.7 g/dL (ref 30.0–36.0)
MCV: 93.3 fL (ref 80.0–100.0)
Platelets: 216 10*3/uL (ref 150–400)
RBC: 4.06 MIL/uL (ref 3.87–5.11)
RDW: 13.5 % (ref 11.5–15.5)
WBC: 6.4 10*3/uL (ref 4.0–10.5)
nRBC: 0 % (ref 0.0–0.2)

## 2022-11-28 LAB — URINALYSIS, ROUTINE W REFLEX MICROSCOPIC
Bilirubin Urine: NEGATIVE
Glucose, UA: NEGATIVE mg/dL
Hgb urine dipstick: NEGATIVE
Ketones, ur: NEGATIVE mg/dL
Leukocytes,Ua: NEGATIVE
Nitrite: NEGATIVE
Protein, ur: NEGATIVE mg/dL
Specific Gravity, Urine: 1.01 (ref 1.005–1.030)
pH: 6 (ref 5.0–8.0)

## 2022-11-28 LAB — RESP PANEL BY RT-PCR (RSV, FLU A&B, COVID)  RVPGX2
Influenza A by PCR: NEGATIVE
Influenza B by PCR: NEGATIVE
Resp Syncytial Virus by PCR: NEGATIVE
SARS Coronavirus 2 by RT PCR: NEGATIVE

## 2022-11-28 LAB — BASIC METABOLIC PANEL
Anion gap: 7 (ref 5–15)
BUN: 12 mg/dL (ref 8–23)
CO2: 29 mmol/L (ref 22–32)
Calcium: 8.7 mg/dL — ABNORMAL LOW (ref 8.9–10.3)
Chloride: 105 mmol/L (ref 98–111)
Creatinine, Ser: 0.73 mg/dL (ref 0.44–1.00)
GFR, Estimated: >60 mL/min (ref >60)
Glucose, Bld: 96 mg/dL (ref 70–99)
Potassium: 3.9 mmol/L (ref 3.5–5.1)
Sodium: 141 mmol/L (ref 135–145)

## 2022-11-28 NOTE — ED Notes (Signed)
Attempted IV/labs x 1; unable to obtain.  

## 2022-11-28 NOTE — ED Triage Notes (Signed)
Pt states dizziness and high blood pressure that started yesterday Cardiology told to come to ER  States head feels full/tight  On Eliquis

## 2022-11-28 NOTE — ED Provider Notes (Cosign Needed Addendum)
[place] [facility] Provider Note   CSN: [id number] Arrival date & time: 11/28/22  1327     History  Chief Complaint  Patient presents with   Hypertension    Vanessa Fernandez is a 89 y.o. female.  Patient presents with hypertension, after you have had feeling swimmy/unwell, with headache.  Patient reports her head felt full/swimmy/unwell/minor headache starting yesterday.  Patient notes she woke up this morning and took her blood pressure and it was elevated to near 200 systolic.  Patient called her cardiologist and informed her of blood pressure results, who recommended that she go to the ED to be evaluated.  Patient notes she has no history of hypertension, and that her blood pressures usually remain around 120s / 60-70's.  Patient denies any chest pain, shortness of breath, dyspnea, abdominal pain, decreased in urination.  Patient notes that she has been urinating more frequently than usual.  Patient otherwise feeling well, and in normal state of health, no nausea, vomiting, diarrhea. Patient does note that she's had decreased PO fluid intake over the last few days, and thought dehydration may be playing a role.  The history is provided by the patient and a relative.  Hypertension This is a new problem. The current episode started yesterday. The problem has been resolved. Associated symptoms include headaches. Pertinent negatives include no chest pain, no abdominal pain and no shortness of breath. She has tried water for the symptoms. The treatment provided no relief.       Home Medications Prior to Admission medications   Medication Sig Start Date End Date Taking? Authorizing Provider  acetaminophen (TYLENOL) 500 MG tablet Take 1,000 mg by mouth every 6 (six) hours as needed. For pain     [provider]  apixaban (ELIQUIS) 5 MG TABS tablet Take 2.5 mg by mouth 2 (two) times daily. 07/21/19   [provider]  Cholecalciferol  (VITAMIN D3) 50 MCG (2000 UT) capsule Take 2,000 Units by mouth daily.    [provider]  clorazepate (TRANXENE) 7.5 MG tablet Take 7.5 mg by mouth at bedtime as needed for sleep.    [provider]  cromolyn (NASALCROM) 5.2 MG/ACT nasal spray Place 1 spray into the nose daily as needed (nasal congestion).    [provider]  Cyanocobalamin (VITAMIN B-12 PO) Take 1 tablet by mouth daily.    [provider]  famotidine (PEPCID) 20 MG tablet Take 20 mg by mouth daily.    [provider]  flecainide (TAMBOCOR) 50 MG tablet Take 25-50 mg by mouth See admin instructions. 25 mg in the morning, 50 mg in the evening.    [provider]  guaiFENesin-dextromethorphan (ROBITUSSIN DM) 100-10 MG/5ML syrup Take 5 mLs by mouth 3 (three) times daily as needed for cough. 05/24/22   Benjiman Core, MD  latanoprost (XALATAN) 0.005 % ophthalmic solution Place 1 drop into both eyes at bedtime. 05/30/19   [provider]  levothyroxine (SYNTHROID) 112 MCG tablet Take 112 mcg by mouth at bedtime. 12/29/19   [provider]  magnesium oxide (MAG-OX) 400 MG tablet Take 400 mg by mouth daily.    [provider]  Multiple Vitamin (MULTI-VITAMIN) tablet Take 1 tablet by mouth daily.    [provider]  pantoprazole (PROTONIX) 40 MG tablet Take 1 tablet (40 mg total) by mouth daily. 04/01/22 05/01/22  Lanae Boast, MD  sucralfate (CARAFATE) 1 GM/10ML suspension Take 1 g by mouth daily as needed (severe  acid reflux).    [provider]      Allergies    Asa [aspirin], Bextra [valdecoxib], Celebrex [celecoxib], Codeine, Motrin [ibuprofen], Naprosyn [naproxen], and Sulfa antibiotics    Review of Systems   Review of Systems  Constitutional:  Negative for fever.  Respiratory:  Negative for shortness of breath.   Cardiovascular:  Negative for chest pain.  Gastrointestinal:  Negative for abdominal pain, diarrhea, nausea and vomiting.   Neurological:  Positive for headaches.    Physical Exam Updated Vital Signs BP (!) 157/56   Pulse 70   Temp 97.7 F (36.5 C)   Resp 17   Ht 5\' 4"  (1.626 m)   Wt 86.6 kg   SpO2 99%   BMI 32.77 kg/m  Physical Exam Constitutional:      General: She is not in acute distress.    Appearance: Normal appearance. She is normal weight. She is not ill-appearing.  Cardiovascular:     Rate and Rhythm: Normal rate and regular rhythm.     Pulses: Normal pulses.     Heart sounds: Normal heart sounds. No murmur heard.    No friction rub. No gallop.  Pulmonary:     Effort: Pulmonary effort is normal. No respiratory distress.     Breath sounds: Normal breath sounds. No stridor. No wheezing, rhonchi or rales.  Abdominal:     General: Abdomen is flat. Bowel sounds are normal. There is no distension.     Palpations: Abdomen is soft. There is no mass.     Tenderness: There is no abdominal tenderness. There is no guarding.  Neurological:     Mental Status: She is alert.  Psychiatric:        Mood and Affect: Mood normal.        Behavior: Behavior normal.     ED Results / Procedures / Treatments   Labs (all labs ordered are listed, but only abnormal results are displayed) Labs Reviewed  RESP PANEL BY RT-PCR (RSV, FLU A&B, COVID)  [id number]  BASIC METABOLIC PANEL  CBC  URINALYSIS, ROUTINE W REFLEX MICROSCOPIC    EKG None  Radiology No results found.  Procedures Procedures    Medications Ordered in ED Medications - No data to display  ED Course/ Medical Decision Making/ A&P                             Medical Decision Making Patient comes in for hypertensive urgency, following recommendation from cardiologist.  Patient noted to have hypertension to 200 systolic today, and notes that her blood pressure usually ranges around the 120s /60s-70s.  On arrival to ED, patient received fluid bolus, and recheck blood pressure noting it was 150's/90's.  Patient complains of slight  headache, but more so full sensation in head, that has improved since arrival to emergency department.  Will evaluate for signs of endorgan damage, with lab workup.  Patient discussed with provider, Blenda Peals, at end of shift, this new provider to take over care.  Amount and/or Complexity of Data Reviewed Labs: ordered.    Details: BMP, CBC Radiology: ordered.          Final Clinical Impression(s) / ED Diagnoses Final diagnoses:  None    Rx / DC Orders ED Discharge Orders     None         Bess Kinds, MD 11/28/22 1535    Bess Kinds, MD 11/28/22 1535    Sloan Leiter,  DO 11/30/22 [id number]

## 2022-11-28 NOTE — Discharge Instructions (Addendum)
As discussed, continue to monitor your blood pressure at home.  Workup today overall reassuring.  No evidence of kidney damage, stroke or other abnormality on your laboratory and imaging studies.  Will withhold from treatment of blood pressure at this time given only acute elevation.  Recommend taking her blood pressure at home with a upper arm cuff once in the morning and once in the evening and logging blood pressure on chart.  Recommend follow-up with primary care within 2 to 3 days for reassessment.  Please do not hesitate to return to emergency department for worrisome signs and symptoms we discussed become apparent.

## 2022-11-28 NOTE — ED Notes (Signed)
Pt states normally needs [place] for IV placement

## 2022-11-28 NOTE — ED Provider Notes (Signed)
Physical Exam  BP (!) 157/56   Pulse 70   Temp 97.7 F (36.5 C)   Resp 17   Ht 5\' 4"  (1.626 m)   Wt 86.6 kg   SpO2 99%   BMI 32.77 kg/m   Physical Exam Vitals and nursing note reviewed.  Constitutional:      General: She is not in acute distress.    Appearance: She is well-developed.  HENT:     Head: Normocephalic and atraumatic.  Eyes:     Conjunctiva/sclera: Conjunctivae normal.  Cardiovascular:     Rate and Rhythm: Normal rate and regular rhythm.  Pulmonary:     Effort: Pulmonary effort is normal. No respiratory distress.     Breath sounds: Normal breath sounds.  Abdominal:     Palpations: Abdomen is soft.     Tenderness: There is no abdominal tenderness.  Musculoskeletal:        General: No swelling.     Cervical back: Neck supple.  Skin:    General: Skin is warm and dry.     Capillary Refill: Capillary refill takes less than 2 seconds.  Neurological:     Mental Status: She is alert.  Psychiatric:        Mood and Affect: Mood normal.     Procedures  Procedures  ED Course / MDM    Medical Decision Making Amount and/or Complexity of Data Reviewed Labs: ordered. Radiology: ordered.   89 year old female presents emergency department with complaints of elevated blood pressure, headache.  Patient care handed off from Dr. Barbaraann Faster at shift change see prior note for more full details.  In short, patient awoke this morning with feelings of headache and "feeling swimmy headed" and took her blood pressure and it was around 200 systolic.  She was told to come the emergency department after she got up with her cardiologist for further evaluation.  Patient reports no prior history of hypertension.  Patient reports some urinary frequency but otherwise has been at baseline.  Denies any visual disturbance, gait abnormality from baseline, weakness/sensory deficit of lower extremities, slurred speech, facial droop, double vision, difficulty speaking/swallowing.  History of  change, plan was to follow-up on BMP and as long as no evidence of renal dysfunction, follow-up with primary care in the outpatient setting for continued monitoring of blood pressure given that patient's blood pressure has significantly improved independent of therapy.  Laboratory studies: No leukocytosis.  No evidence of anemia.  Platelets within normal range.  No Electra abnormalities besides mild hypocalcemia of 8.7.  No renal dysfunction.  UA without abnormality.  Respiratory viral panel negative.  Imaging studies: CT head: No acute intracranial abnormality.  Generalized cerebral atrophy.   Elevated blood pressure Vital signs significant for hypertension with blood pressure in the 150s to 160s systolic over 90s otherwise within normal range and stable throughout visit Laboratory studies or imaging studies significant for: See above 89 year old female presents emergency department with complaints of elevated blood pressure reading as well as mild headache.  Patient's workup today overall reassuring without evidence of endorgan damage with improvement of blood pressure from systolic in the 200s earlier today to now on readings in the 150s systolic.  Will refrain from beginning antihypertensive medications at this time given patient's age and 1 day of symptoms, will recommend close follow-up with primary care in the outpatient setting for further assessment of blood pressure while educating patient to log blood pressure at home and present log to primary care.  Treatment plan discussed at  length with patient and she knowledge understanding was agreeable to said plan.  Patient overall well-appearing, afebrile in no acute distress. Worrisome signs and symptoms were discussed with the patient and the patient acknowledged understanding to return to emergency department if notice.  Patient stable upon discharge.   Peter Garter, [place] 11/28/22 1658    Sloan Leiter, DO 11/30/22 [phone]

## 2022-12-12 ENCOUNTER — Other Ambulatory Visit: Payer: Self-pay

## 2022-12-12 ENCOUNTER — Emergency Department (HOSPITAL_BASED_OUTPATIENT_CLINIC_OR_DEPARTMENT_OTHER)
Admission: EM | Admit: 2022-12-12 | Discharge: 2022-12-12 | Disposition: A | Discharge status: Home / Self Care | Payer: Medicare Other | Attending: Emergency Medicine | Admitting: Emergency Medicine

## 2022-12-12 ENCOUNTER — Emergency Department (HOSPITAL_BASED_OUTPATIENT_CLINIC_OR_DEPARTMENT_OTHER): Payer: Medicare Other

## 2022-12-12 ENCOUNTER — Encounter (HOSPITAL_BASED_OUTPATIENT_CLINIC_OR_DEPARTMENT_OTHER): Payer: Self-pay

## 2022-12-12 DIAGNOSIS — I700 Atherosclerosis of aorta: Secondary | ICD-10-CM | POA: Insufficient documentation

## 2022-12-12 DIAGNOSIS — R110 Nausea: Secondary | ICD-10-CM | POA: Diagnosis present

## 2022-12-12 DIAGNOSIS — K219 Gastro-esophageal reflux disease without esophagitis: Secondary | ICD-10-CM | POA: Insufficient documentation

## 2022-12-12 DIAGNOSIS — R7309 Other abnormal glucose: Secondary | ICD-10-CM | POA: Insufficient documentation

## 2022-12-12 DIAGNOSIS — M546 Pain in thoracic spine: Secondary | ICD-10-CM | POA: Insufficient documentation

## 2022-12-12 DIAGNOSIS — I4891 Unspecified atrial fibrillation: Secondary | ICD-10-CM | POA: Diagnosis not present

## 2022-12-12 DIAGNOSIS — Z7901 Long term (current) use of anticoagulants: Secondary | ICD-10-CM | POA: Insufficient documentation

## 2022-12-12 DIAGNOSIS — R0602 Shortness of breath: Secondary | ICD-10-CM | POA: Insufficient documentation

## 2022-12-12 LAB — COMPREHENSIVE METABOLIC PANEL
ALT: 17 U/L (ref 0–44)
AST: 26 U/L (ref 15–41)
Albumin: 3.7 g/dL (ref 3.5–5.0)
Alkaline Phosphatase: 57 U/L (ref 38–126)
Anion gap: 10 (ref 5–15)
BUN: 18 mg/dL (ref 8–23)
CO2: 26 mmol/L (ref 22–32)
Calcium: 9 mg/dL (ref 8.9–10.3)
Chloride: 101 mmol/L (ref 98–111)
Creatinine, Ser: 0.86 mg/dL (ref 0.44–1.00)
GFR, Estimated: >60 mL/min (ref >60)
Glucose, Bld: 85 mg/dL (ref 70–99)
Potassium: 3.9 mmol/L (ref 3.5–5.1)
Sodium: 137 mmol/L (ref 135–145)
Total Bilirubin: 0.8 mg/dL (ref 0.3–1.2)
Total Protein: 6.4 g/dL — ABNORMAL LOW (ref 6.5–8.1)

## 2022-12-12 LAB — CBC
HCT: 39.4 % (ref 36.0–46.0)
Hemoglobin: 13.2 g/dL (ref 12.0–15.0)
MCH: 30.8 pg (ref 26.0–34.0)
MCHC: 33.5 g/dL (ref 30.0–36.0)
MCV: 92.1 fL (ref 80.0–100.0)
Platelets: 288 10*3/uL (ref 150–400)
RBC: 4.28 MIL/uL (ref 3.87–5.11)
RDW: 13.4 % (ref 11.5–15.5)
WBC: 8.4 10*3/uL (ref 4.0–10.5)
nRBC: 0 % (ref 0.0–0.2)

## 2022-12-12 LAB — LIPASE, BLOOD: Lipase: 30 U/L (ref 11–51)

## 2022-12-12 LAB — CBG MONITORING, ED: Glucose-Capillary: 75 mg/dL (ref 70–99)

## 2022-12-12 LAB — TROPONIN I (HIGH SENSITIVITY)
Troponin I (High Sensitivity): 7 ng/L (ref <18)
Troponin I (High Sensitivity): 7 ng/L (ref <18)

## 2022-12-12 MED ORDER — DOCUSATE SODIUM 100 MG PO CAPS: 100.0000 mg | ORAL_CAPSULE | Freq: Two times a day (BID) | ORAL | 0 refills | Status: DC

## 2022-12-12 MED ORDER — IPRATROPIUM-ALBUTEROL 0.5-2.5 (3) MG/3ML IN SOLN
3.0000 mL | Freq: Once | RESPIRATORY_TRACT | Status: AC
  Administered 2022-12-12: 3 mL via RESPIRATORY_TRACT
  Filled 2022-12-12: qty 3

## 2022-12-12 MED ORDER — ONDANSETRON 8 MG PO TBDP: 8.0000 mg | ORAL_TABLET | Freq: Three times a day (TID) | ORAL | 0 refills | Status: AC | PRN

## 2022-12-12 MED ORDER — SODIUM CHLORIDE 0.9 % IV SOLN
1000.0000 mL | INTRAVENOUS | Status: DC
  Administered 2022-12-12: 1000 mL via INTRAVENOUS

## 2022-12-12 MED ORDER — SUCRALFATE 1 G PO TABS: 1.0000 g | ORAL_TABLET | Freq: Three times a day (TID) | ORAL | 0 refills | Status: DC

## 2022-12-12 MED ORDER — PANTOPRAZOLE SODIUM 40 MG IV SOLR
40.0000 mg | Freq: Once | INTRAVENOUS | Status: AC
  Administered 2022-12-12: 40 mg via INTRAVENOUS
  Filled 2022-12-12: qty 10

## 2022-12-12 MED ORDER — ONDANSETRON HCL 4 MG/2ML IJ SOLN
4.0000 mg | Freq: Once | INTRAMUSCULAR | Status: AC
  Administered 2022-12-12: 4 mg via INTRAVENOUS
  Filled 2022-12-12: qty 2

## 2022-12-12 MED ORDER — SODIUM CHLORIDE 0.9 % IV BOLUS (SEPSIS)
500.0000 mL | Freq: Once | INTRAVENOUS | Status: AC
  Administered 2022-12-12: 500 mL via INTRAVENOUS

## 2022-12-12 MED ORDER — IOHEXOL 350 MG/ML SOLN
100.0000 mL | Freq: Once | INTRAVENOUS | Status: AC | PRN
  Administered 2022-12-12: 100 mL via INTRAVENOUS

## 2022-12-12 MED ORDER — PANTOPRAZOLE SODIUM 40 MG PO TBEC: 40.0000 mg | DELAYED_RELEASE_TABLET | Freq: Every day | ORAL | 0 refills | Status: DC

## 2022-12-12 MED ORDER — FENTANYL CITRATE PF 50 MCG/ML IJ SOSY
50.0000 ug | PREFILLED_SYRINGE | Freq: Once | INTRAMUSCULAR | Status: AC
  Administered 2022-12-12: 50 ug via INTRAVENOUS
  Filled 2022-12-12: qty 1

## 2022-12-12 NOTE — Progress Notes (Signed)
Patient called out and stated she was having some SOB. Patient had just received pain medication. BBS slightly coarse, SAT 98%, HR 78. RN and I repositioned patient in the bed. VS WNL.

## 2022-12-12 NOTE — ED Notes (Signed)
Attempted IV start, flash noted but unable to get blood return and vein blew with flush, states she always needs an [place] IV, notified us certified staff mbr

## 2022-12-12 NOTE — Discharge Instructions (Signed)
Take the medications as prescribed.  Follow-up with your primary care doctor and GI doctor as planned.  Return as needed for worsening symptoms.

## 2022-12-12 NOTE — ED Notes (Signed)
 X-ray will bring pt to room after imaging is finished.

## 2022-12-12 NOTE — Progress Notes (Signed)
Patient stated she is breathing easier, but SAT dropped to 79-80%. Placed on 2L and now 100%. Patient to be transported to CT

## 2022-12-12 NOTE — ED Provider Notes (Signed)
[facility] Provider Note   CSN: [id number] Arrival date & time: 12/12/22  1512     History  Chief Complaint  Patient presents with   Palpitations    Vanessa Fernandez is a 89 y.o. female.   Palpitations    Patient has a history of depression glaucoma diverticulitis reflux thyroid disease.  She presents to the ED with complaints of nausea and shortness of breath.  Patient states she started having symptoms last night.  She felt palpitations.  Patient does have history of A-fib.  She is not sure if she was back in it.  She is also had some shortness of breath as well as nausea.  She has pain in her upper back.  It does go into her abdomen.  She denies any fevers.  No coughing.  Home Medications Prior to Admission medications   Medication Sig Start Date End Date Taking? Authorizing Provider  docusate sodium (COLACE) 100 MG capsule Take 1 capsule (100 mg total) by mouth every 12 (twelve) hours. 12/12/22  Yes Linwood Dibbles, MD  ondansetron (ZOFRAN-ODT) 8 MG disintegrating tablet Take 1 tablet (8 mg total) by mouth every 8 (eight) hours as needed for nausea or vomiting. 12/12/22  Yes Linwood Dibbles, MD  sucralfate (CARAFATE) 1 g tablet Take 1 tablet (1 g total) by mouth 3 (three) times daily. 12/12/22  Yes Linwood Dibbles, MD  acetaminophen (TYLENOL) 500 MG tablet Take 1,000 mg by mouth every 6 (six) hours as needed. For pain     [provider]  apixaban (ELIQUIS) 5 MG TABS tablet Take 2.5 mg by mouth 2 (two) times daily. 07/21/19   [provider]  Cholecalciferol (VITAMIN D3) 50 MCG (2000 UT) capsule Take 2,000 Units by mouth daily.    [provider]  clorazepate (TRANXENE) 7.5 MG tablet Take 7.5 mg by mouth at bedtime as needed for sleep.    [provider]  cromolyn (NASALCROM) 5.2 MG/ACT nasal spray Place 1 spray into the nose daily as needed (nasal congestion).    [provider]  Cyanocobalamin (VITAMIN  B-12 PO) Take 1 tablet by mouth daily.    [provider]  famotidine (PEPCID) 20 MG tablet Take 20 mg by mouth daily.    [provider]  flecainide (TAMBOCOR) 50 MG tablet Take 25-50 mg by mouth See admin instructions. 25 mg in the morning, 50 mg in the evening.    [provider]  guaiFENesin-dextromethorphan (ROBITUSSIN DM) 100-10 MG/5ML syrup Take 5 mLs by mouth 3 (three) times daily as needed for cough. 05/24/22   Benjiman Core, MD  latanoprost (XALATAN) 0.005 % ophthalmic solution Place 1 drop into both eyes at bedtime. 05/30/19   [provider]  levothyroxine (SYNTHROID) 112 MCG tablet Take 112 mcg by mouth at bedtime. 12/29/19   [provider]  magnesium oxide (MAG-OX) 400 MG tablet Take 400 mg by mouth daily.    [provider]  Multiple Vitamin (MULTI-VITAMIN) tablet Take 1 tablet by mouth daily.    [provider]  pantoprazole (PROTONIX) 40 MG tablet Take 1 tablet (40 mg total) by mouth daily. 12/12/22 01/11/23  Linwood Dibbles, MD      Allergies    Asa [aspirin], Bextra [valdecoxib], Celebrex [celecoxib], Codeine, Motrin [ibuprofen], Naprosyn [naproxen], and Sulfa antibiotics    Review of Systems   Review of Systems  Cardiovascular:  Positive for palpitations.    Physical Exam Updated Vital Signs BP (!) 125/52  Pulse 69   Temp 97.9 F (36.6 C) (Oral)   Resp 20   Ht 1.626 m (5\' 4" )   Wt 86.2 kg   SpO2 99%   BMI 32.61 kg/m  Physical Exam Vitals and nursing note reviewed.  Constitutional:      Appearance: She is well-developed.  HENT:     Head: Normocephalic and atraumatic.     Right Ear: External ear normal.     Left Ear: External ear normal.  Eyes:     General: No scleral icterus.       Right eye: No discharge.        Left eye: No discharge.     Conjunctiva/sclera: Conjunctivae normal.  Neck:     Trachea: No tracheal deviation.  Cardiovascular:     Rate and Rhythm: Normal rate and regular rhythm.   Pulmonary:     Effort: Pulmonary effort is normal. No respiratory distress.     Breath sounds: Normal breath sounds. No stridor. No wheezing or rales.  Abdominal:     General: Bowel sounds are normal. There is no distension.     Palpations: Abdomen is soft.     Tenderness: There is no abdominal tenderness. There is no guarding or rebound.  Musculoskeletal:        General: No tenderness or deformity.     Cervical back: Neck supple.  Skin:    General: Skin is warm and dry.     Findings: No rash.  Neurological:     General: No focal deficit present.     Mental Status: She is alert.     Cranial Nerves: No cranial nerve deficit, dysarthria or facial asymmetry.     Sensory: No sensory deficit.     Motor: No abnormal muscle tone or seizure activity.     Coordination: Coordination normal.  Psychiatric:        Mood and Affect: Mood normal.     ED Results / Procedures / Treatments   Labs (all labs ordered are listed, but only abnormal results are displayed) Labs Reviewed  COMPREHENSIVE METABOLIC PANEL - Abnormal; Notable for the following components:      Result Value   Total Protein 6.4 (*)    All other components within normal limits  CBC  LIPASE, BLOOD  CBG MONITORING, ED  TROPONIN I (HIGH SENSITIVITY)  TROPONIN I (HIGH SENSITIVITY)    EKG EKG Interpretation Date/Time:  Tuesday December 12 2022 15:20:39 EDT Ventricular Rate:  67 PR Interval:    QRS Duration:  117 QT Interval:  432 QTC Calculation: 457 R Axis:   -44  Text Interpretation: Junctional rhythm vs sinus Nonspecific IVCD with LAD Borderline T abnormalities, anterior leads Baseline wander in lead(s) III aVL Confirmed by Linwood Dibbles [phone]) on 12/12/2022 3:24:17 PM  Radiology CT Angio Chest Aorta W and/or Wo Contrast  Result Date: 12/12/2022 CLINICAL DATA:  Acute aortic syndrome suspected. EXAM: CT ANGIOGRAPHY CHEST WITH CONTRAST TECHNIQUE: Multidetector CT imaging of the chest was performed using the standard  protocol during bolus administration of intravenous contrast. Multiplanar CT image reconstructions and MIPs were obtained to evaluate the vascular anatomy. RADIATION DOSE REDUCTION: This exam was performed according to the departmental dose-optimization program which includes automated exposure control, adjustment of the mA and/or kV according to patient size and/or use of iterative reconstruction technique. CONTRAST:  OMNIPAQUE IOHEXOL 350 MG/ML SOLN COMPARISON:  Chest radiograph 12/12/2022.  CT 01/29/2020 FINDINGS: Cardiovascular: Unenhanced images of the chest demonstrate calcification in the aorta and coronary  arteries. Calcification in the mitral valve annulus. No evidence of intramural hematoma. Venous gas is present, likely arising from intravenous injections. Images obtained during arterial phase after intravenous injection of contrast material demonstrate normal caliber thoracic aorta. No aortic dissection. Great vessel origins are patent. Central pulmonary arteries are patent. No evidence of significant pulmonary embolus. Normal heart size. No pericardial effusions. Mediastinum/Nodes: No enlarged mediastinal, hilar, or axillary lymph nodes. Thyroid gland, trachea, and esophagus demonstrate no significant findings. Lungs/Pleura: Lungs are clear. No pleural effusion or pneumothorax. Upper Abdomen: No acute abnormality. Musculoskeletal: Degenerative changes in the spine. No acute bony abnormalities. Review of the MIP images confirms the above findings. IMPRESSION: 1. Normal caliber thoracic aorta. No evidence of aortic aneurysm or dissection. 2. No evidence of significant pulmonary embolus. 3. Lungs are clear. 4. Aortic atherosclerosis. Electronically Signed   By: Burman Nieves M.D.   On: 12/12/2022 19:37   DG Chest 2 View  Result Date: 12/12/2022 CLINICAL DATA:  SOB EXAM: CHEST - 2 VIEW COMPARISON:  CXR 05/24/22 FINDINGS: No pleural effusion. No pneumothorax. Likely unchanged cardiac and  mediastinal contours when accounting for differences in technique. No focal airspace opacity. No radiographically apparent displaced rib fractures. Visualized upper abdomen is unremarkable. Vertebral body heights are maintained. IMPRESSION: No focal airspace opacity. Electronically Signed   By: Lorenza Cambridge M.D.   On: 12/12/2022 16:19    Procedures Procedures    Medications Ordered in ED Medications  sodium chloride 0.9 % bolus 500 mL (0 mLs Intravenous Stopped 12/12/22 1740)    Followed by  0.9 %  sodium chloride infusion (1,000 mLs Intravenous New Bag/Given 12/12/22 1741)  pantoprazole (PROTONIX) injection 40 mg (40 mg Intravenous Given 12/12/22 1656)  ondansetron (ZOFRAN) injection 4 mg (4 mg Intravenous Given 12/12/22 1656)  fentaNYL (SUBLIMAZE) injection 50 mcg (50 mcg Intravenous Given 12/12/22 1656)  ipratropium-albuterol (DUONEB) 0.5-2.5 (3) MG/3ML nebulizer solution 3 mL (3 mLs Nebulization Given 12/12/22 1717)  iohexol (OMNIPAQUE) 350 MG/ML injection 100 mL (100 mLs Intravenous Contrast Given 12/12/22 1847)    ED Course/ Medical Decision Making/ A&P Clinical Course as of 12/12/22 1954  Tue Dec 12, 2022  1713 Patient complaining of some heaviness in her chest after the fentanyl.  Suspect she is having a slight reaction.  Will give a dose of Benadryl.  Oxygen saturation is normal.  Will continue to monitor. [JK]  1725 Troponin I (High Sensitivity) Troponin normal.  Metabolic panel normal.  CBC normal [JK]  1725 Lipase, blood Lipase normal [JK]  1725 Chest x-ray without acute abnormalities [JK]  1940 CT angiogram does not show any evidence of aneurysm or dissection.  No large pulmonary embolism [JK]  1940 Serial troponins normal [JK]    Clinical Course User Index [JK] Linwood Dibbles, MD                             Medical Decision Making Problems Addressed: Gastroesophageal reflux disease, unspecified whether esophagitis present: acute illness or injury that poses a threat to life  or bodily functions  Amount and/or Complexity of Data Reviewed Labs: ordered. Decision-making details documented in ED Course. Radiology: ordered and independent interpretation performed.  Risk OTC drugs. Prescription drug management.   Patient presented to the ED for evaluation of chest discomfort and nausea.  Concerned about the possibility of acute coronary syndrome, aortic dissection and PE.  Patient's ED workup reassuring.  Serial troponins normal.  EKG reassuring.  Patient CBC and metabolic  panel unremarkable.  CT angiogram does not show any evidence of dissection or PE.  She is feeling better after treatment.  She initially did have a reaction to the fentanyl but that has was now resolved.  Outpatient records reviewed and patient does have history of acid reflux.  She is scheduled to see GI.  Patient states her symptoms seem to be triggered after eating a meal.  She has been taking her H2 blocker.  She is not taking the Carafate that she has prescribed because it makes her constipated.  Discussed having her try Protonix and also recommend that she take her Carafate.  Also give her prescription for Zofran and a stool softener.  Evaluation and diagnostic testing in the emergency department does not suggest an emergent condition requiring admission or immediate intervention beyond what has been performed at this time.  The patient is safe for discharge and has been instructed to return immediately for worsening symptoms, change in symptoms or any other concerns.        Final Clinical Impression(s) / ED Diagnoses Final diagnoses:  Gastroesophageal reflux disease, unspecified whether esophagitis present    Rx / DC Orders ED Discharge Orders          Ordered    ondansetron (ZOFRAN-ODT) 8 MG disintegrating tablet  Every 8 hours PRN        12/12/22 1952    docusate sodium (COLACE) 100 MG capsule  Every 12 hours        12/12/22 1952    pantoprazole (PROTONIX) 40 MG tablet  Daily         12/12/22 1952    sucralfate (CARAFATE) 1 g tablet  3 times daily        12/12/22 1952              Linwood Dibbles, MD 12/12/22 1954

## 2022-12-12 NOTE — ED Notes (Signed)
RN called into pt room, pt breathing is labored, all VS are WNL and pt is responsive to voice, RT and MD called to bedside

## 2022-12-12 NOTE — ED Triage Notes (Signed)
Pt arrives with c/o palpitations that started last night. Pt endorses SOB, nausea, and back pain. Per pt, pain radiates into her ABD.

## 2023-11-27 NOTE — Progress Notes (Signed)
 Patient presents for No chief complaint on file.  Vanessa Fernandez presents to primary care clinic for ongoing management of Other .  SUBJECTIVE   Pt was seen 11/12/23 by [facility] for possible urine infection.  Urine culture obtained and showed a variety of yeast that was suspected to be a vaginal contamination. She is unable to use diflucan due to flecainide  usage. She did use the Monistat advised x 7 days. She also completed 10 days of Doxycycline.   Today: She states that she still feels bad. Still having dysuria and chills. Was up about 10 times last night urinating.  Denies vaginal discomfort or belly pain.  Is having a lot of upper back pain. No fevers.  No abdominal pain Sttates that over the last few days her feet have begun swelling and this is brand new for her.  Just feels bad all over.  States that last night she had a lot of burning in her stomach and some A. Fib. Was at GI this morning (and a CT abd/pelvis was ordered). States that she goes in and out of a.fib frequently (and sees Dr. Epifanio re: this -- recent telephone note 08/28/23 -- pt c/o a fib x a few weeks. Holter ordered. Had f/u visit 5/13 and cardizem was added).   States she felt better after taking the antibiotic but the burning recurred a few days ago. States it feels like the muscles are burning and spasming. Does not feel burning with urination. Last night, she had to void every hour but states the urine just seems to dribble out. If she sits on the toilet for a while, the urine will intermittently trickle out. No incontinence. No fever/chills. Feels a little nauseous but no emesis. States she does have some pain across the mid/low back. +h/o kidney stones but this was 15 years ago or so. Denies diarrhea. No vaginal discharge, odor, or itching.   Has noticed some swelling in the feet and ankles yesterday. No pain. States no calf pain or tenderness. Sx resolved today.  Saw GI this morning as she also had some  epigastric pain. States gi did order a CT abd/pelvis with contrast.  +h/o a fib, hypothyroidism, and gerd.     New problems/Concerns: None  Allergies[1]  Current Medications[2]  The following portions of the patient's history were reviewed and updated as appropriate: allergies, current medications, past medical history, past social history, past family history, and problem list.   ROS   See HPI.  OBJECTIVE  BP 125/67 (BP Location: Right arm, Patient Position: Sitting)   Pulse 71   Temp 98 F (36.7 C) (Oral)   Wt 84.8 kg (187 lb)   BMI 33.13 kg/m   Wt Readings from Last 3 Encounters:  11/27/23 84.8 kg (187 lb)  11/27/23 85.1 kg (187 lb 9.6 oz)  11/12/23 84.6 kg (186 lb 9.6 oz)    Wd, wn, pleasant, nad Canals clear, Tms nml b/l MMM, OP clear RRR CTAB, no wrr +nabs, soft, mild diffuse abdominal pain but increased in epigastric area and the suprapubic region +b/l CVA tenderness Trace edema b/l LE Nml mood and affect Pt defers pelvic exam stating she has no soreness in the vulvar/vaginal area but pain is felt in the suprapubic region after voiding   Urinalysis results:  Lab Results  Component Value Date   COLORU Yellow 11/27/2023   CLARITYU Clear 11/27/2023   SPECGRAV 1.015 11/27/2023   PHUR 5.5 11/27/2023   PROTEINUA Negative 11/27/2023   GLUCOSEU Negative  11/27/2023   KETONEU Negative 11/27/2023   BILIRUBINUR Negative 11/27/2023   BLOODU Negative 11/27/2023   UROBILINOGEN 0.2 11/27/2023   LEUKOUA Negative 11/27/2023     ASSESSMENT/PLAN   Diagnoses and all orders for this visit:  Dysuria/Generalized abdominal pain/Flank pain -     POC Urinalysis Auto without Microscopic -     Urine Culture; Future -     CT Abdomen Pelvis W Contrast; Future  Discussed that if imaging is negative, we will refer her to urology for further eval. She verbalized understanding.   Return if symptoms worsen or fail to improve.  This document serves as a record of services  personally performed by Twyla Silversmith, MD.  It was created on their behalf by Charmaine Jama Kato, CMA, a trained medical scribe, and Certified Medical Assistant (CMA). During the course of documenting the history, physical exam and medical decision making, I was functioning as a Stage manager. The creation of this record is the provider's dictation and/or activities during the visit.  Electronically signed by Charmaine Jama Kato, CMA 11/27/2023 4:54 PM     The above note has been reviewed for accuracy.  Twyla Madelynn Silversmith, MD        [1] Allergies Allergen Reactions  . Pantoprazole  GI Intolerance    Palpitations, shortness of breath  . Alendronic Acid Other (See Comments)  . Amoxicillin -Pot Clavulanate Rash    States broke out after taking about 6 days  . Aspirin GI Intolerance and Other (See Comments)    GI upset  Gi upset  GI upset  . Bupropion Dizziness and Other (See Comments)  . Celecoxib GI Intolerance and Other (See Comments)    GI upset  Gi upset  . Cimetidine Other (See Comments) and Rash    Reaction unknown  . Diflunisal GI Intolerance and Other (See Comments)  . Ertapenem Other (See Comments)    Reaction unknown  . Gatifloxacin Other (See Comments)    Reaction unknown  . Ibuprofen GI Intolerance and Other (See Comments)    GI upset  . Naproxen Other (See Comments)    GI upset  Gi upset  . Nitroglycerin Other (See Comments) and Rash    It lowers my blood pressure and they told me not to take it again  . Ranitidine Hcl Other (See Comments) and Rash    Reaction unknown  . Risedronate GI Intolerance and Other (See Comments)  . Valdecoxib GI Intolerance and Other (See Comments)    GI upset  . Zolpidem Other (See Comments)    Kept awake  . Alendronate GI Intolerance  . Amitriptyline Other (See Comments)    Insomnia  . Bupropion Hcl Dizziness  . Codeine GI Intolerance  . Dolobid   . Fentanyl  Other (See Comments)    Pt states it causes her to have  trouble breathing.   . Metoclopramide  Fatigue    Felt Weird  . Naproxen Sodium GI Intolerance  . Propafenone GI Intolerance and Dizziness  . Tagamet   . Tequin   . Metronidazole GI Intolerance and Nausea Only  . Sulfa (Sulfonamide Antibiotics) Rash    hands blister  . Sulfamethoxazole Rash    Blisters on hands  [2]  Current Outpatient Medications:  .  acetaminophen  (TYLENOL ) 650 mg ER tablet, Take 650 mg by mouth every 6 (six) hours as needed (pain)., Disp: , Rfl:  .  apixaban  (Eliquis ) 5 mg tab, TAKE 1 TABLET(5 MG) BY MOUTH TWICE DAILY FOR BLOOD CLOT PREVENTION OR ATRIAL FIBRILLATION, Disp:  60 tablet, Rfl: 11 .  cholecalciferol (Vitamin D3) 5,000 unit (125 mcg) tab tablet, Take 5,000 Units by mouth Once Daily., Disp: , Rfl:  .  dilTIAZem (CARDIZEM CD) 120 mg 24 hr capsule, Take one capsule (120 mg total) by mouth daily., Disp: 30 capsule, Rfl: 2 .  famotidine  (PEPCID ) 20 mg tablet, Take 1 tablet (20 mg total) by mouth 2 (two) times a day., Disp: 180 tablet, Rfl: 3 .  levothyroxine  (SYNTHROID ) 112 mcg tablet, Take 1 tablet (112 mcg total) by mouth every morning., Disp: 90 tablet, Rfl: 3 .  magnesium oxide 400 mg (241 mg magnesium) tab, Take 1 tablet by mouth daily., Disp: , Rfl:  .  multivitamin-Ca-iron-minerals (One Daily Calcium /Iron) tab, Take 1 tablet by mouth Once Daily., Disp: , Rfl:  .  ondansetron  (ZOFRAN ) 4 mg tablet, Take 1 tablet (4 mg total) by mouth every 8 (eight) hours as needed for nausea or vomiting., Disp: 20 tablet, Rfl: 0 .  pantoprazole  (PROTONIX ) 40 mg EC tablet, Take 1 tablet (40 mg total) by mouth 2 (two) times a day., Disp: 180 tablet, Rfl: 3 .  sucralfate  (CARAFATE ) 100 mg/mL oral suspension, SHAKE LIQUID AND TAKE 10 ML(1 GRAM) BY MOUTH UP TO FOUR TIMES DAILY AS NEEDED, Disp: 420 mL, Rfl: 1 .  flecainide  (TAMBOCOR ) 50 mg tablet, Take 1 tablet (50 mg total) by mouth 2 (two) times a day. (Patient taking differently: Take 25 mg by mouth 2 (two) times a day.),  Disp: 180 tablet, Rfl: 2

## 2023-12-07 ENCOUNTER — Emergency Department (HOSPITAL_BASED_OUTPATIENT_CLINIC_OR_DEPARTMENT_OTHER)

## 2023-12-07 ENCOUNTER — Encounter (HOSPITAL_BASED_OUTPATIENT_CLINIC_OR_DEPARTMENT_OTHER): Payer: Self-pay

## 2023-12-07 ENCOUNTER — Other Ambulatory Visit: Payer: Self-pay

## 2023-12-07 ENCOUNTER — Emergency Department (HOSPITAL_BASED_OUTPATIENT_CLINIC_OR_DEPARTMENT_OTHER): Admission: EM | Admit: 2023-12-07 | Discharge: 2023-12-07 | Disposition: A | Discharge status: Home / Self Care

## 2023-12-07 DIAGNOSIS — M546 Pain in thoracic spine: Secondary | ICD-10-CM | POA: Insufficient documentation

## 2023-12-07 DIAGNOSIS — R7989 Other specified abnormal findings of blood chemistry: Secondary | ICD-10-CM | POA: Diagnosis not present

## 2023-12-07 DIAGNOSIS — I4891 Unspecified atrial fibrillation: Secondary | ICD-10-CM | POA: Diagnosis not present

## 2023-12-07 DIAGNOSIS — R918 Other nonspecific abnormal finding of lung field: Secondary | ICD-10-CM | POA: Insufficient documentation

## 2023-12-07 DIAGNOSIS — R0789 Other chest pain: Secondary | ICD-10-CM | POA: Diagnosis not present

## 2023-12-07 DIAGNOSIS — Z7901 Long term (current) use of anticoagulants: Secondary | ICD-10-CM | POA: Insufficient documentation

## 2023-12-07 DIAGNOSIS — R110 Nausea: Secondary | ICD-10-CM | POA: Insufficient documentation

## 2023-12-07 DIAGNOSIS — R6883 Chills (without fever): Secondary | ICD-10-CM | POA: Diagnosis not present

## 2023-12-07 DIAGNOSIS — R519 Headache, unspecified: Secondary | ICD-10-CM | POA: Insufficient documentation

## 2023-12-07 DIAGNOSIS — R9431 Abnormal electrocardiogram [ECG] [EKG]: Secondary | ICD-10-CM | POA: Insufficient documentation

## 2023-12-07 HISTORY — DX: Unspecified atrial fibrillation: I48.91

## 2023-12-07 LAB — COMPREHENSIVE METABOLIC PANEL WITH GFR
ALT: 21 U/L (ref 0–44)
AST: 26 U/L (ref 15–41)
Albumin: 4.1 g/dL (ref 3.5–5.0)
Alkaline Phosphatase: 76 U/L (ref 38–126)
Anion gap: 14 (ref 5–15)
BUN: 15 mg/dL (ref 8–23)
CO2: 27 mmol/L (ref 22–32)
Calcium: 9.2 mg/dL (ref 8.9–10.3)
Chloride: 104 mmol/L (ref 98–111)
Creatinine, Ser: 1.05 mg/dL — ABNORMAL HIGH (ref 0.44–1.00)
GFR, Estimated: 48 mL/min — ABNORMAL LOW (ref >60)
Glucose, Bld: 74 mg/dL (ref 70–99)
Potassium: 3.7 mmol/L (ref 3.5–5.1)
Sodium: 145 mmol/L (ref 135–145)
Total Bilirubin: 0.4 mg/dL (ref 0.0–1.2)
Total Protein: 6.4 g/dL — ABNORMAL LOW (ref 6.5–8.1)

## 2023-12-07 LAB — URINALYSIS, W/ REFLEX TO CULTURE (INFECTION SUSPECTED)
Bilirubin Urine: NEGATIVE
Glucose, UA: NEGATIVE mg/dL
Hgb urine dipstick: NEGATIVE
Ketones, ur: NEGATIVE mg/dL
Leukocytes,Ua: NEGATIVE
Nitrite: NEGATIVE
Protein, ur: NEGATIVE mg/dL
RBC / HPF: NONE SEEN RBC/hpf (ref 0–5)
Specific Gravity, Urine: 1.02 (ref 1.005–1.030)
pH: 5.5 (ref 5.0–8.0)

## 2023-12-07 LAB — CBC WITH DIFFERENTIAL/PLATELET
Abs Immature Granulocytes: 0.03 K/uL (ref 0.00–0.07)
Basophils Absolute: 0.1 K/uL (ref 0.0–0.1)
Basophils Relative: 1 %
Eosinophils Absolute: 0.2 K/uL (ref 0.0–0.5)
Eosinophils Relative: 2 %
HCT: 39.7 % (ref 36.0–46.0)
Hemoglobin: 13.3 g/dL (ref 12.0–15.0)
Immature Granulocytes: 0 %
Lymphocytes Relative: 31 %
Lymphs Abs: 2.5 K/uL (ref 0.7–4.0)
MCH: 30.9 pg (ref 26.0–34.0)
MCHC: 33.5 g/dL (ref 30.0–36.0)
MCV: 92.3 fL (ref 80.0–100.0)
Monocytes Absolute: 0.7 K/uL (ref 0.1–1.0)
Monocytes Relative: 9 %
Neutro Abs: 4.6 K/uL (ref 1.7–7.7)
Neutrophils Relative %: 57 %
Platelets: 278 K/uL (ref 150–400)
RBC: 4.3 MIL/uL (ref 3.87–5.11)
RDW: 13.5 % (ref 11.5–15.5)
WBC: 8 K/uL (ref 4.0–10.5)
nRBC: 0 % (ref 0.0–0.2)

## 2023-12-07 LAB — TROPONIN T, HIGH SENSITIVITY
Troponin T High Sensitivity: 20 ng/L — ABNORMAL HIGH (ref <19)
Troponin T High Sensitivity: 20 ng/L — ABNORMAL HIGH

## 2023-12-07 LAB — LIPASE, BLOOD: Lipase: 21 U/L (ref 11–51)

## 2023-12-07 LAB — RESP PANEL BY RT-PCR (RSV, FLU A&B, COVID)  RVPGX2
Influenza A by PCR: NEGATIVE
Influenza B by PCR: NEGATIVE
Resp Syncytial Virus by PCR: NEGATIVE
SARS Coronavirus 2 by RT PCR: NEGATIVE

## 2023-12-07 MED ORDER — ONDANSETRON HCL 4 MG/2ML IJ SOLN
4.0000 mg | Freq: Once | INTRAMUSCULAR | Status: AC
  Administered 2023-12-07: 4 mg via INTRAVENOUS
  Filled 2023-12-07: qty 2

## 2023-12-07 MED ORDER — LIDOCAINE 5 % EX PTCH
2.0000 | MEDICATED_PATCH | CUTANEOUS | Status: DC
  Administered 2023-12-07: 2 via TRANSDERMAL
  Filled 2023-12-07: qty 2

## 2023-12-07 MED ORDER — LIDOCAINE 5 % EX PTCH
2.0000 | MEDICATED_PATCH | CUTANEOUS | 0 refills | Status: AC
Start: 2023-12-07 — End: ?

## 2023-12-07 MED ORDER — IOHEXOL 300 MG/ML  SOLN
75.0000 mL | Freq: Once | INTRAMUSCULAR | Status: AC | PRN
  Administered 2023-12-07: 100 mL via INTRAVENOUS

## 2023-12-07 MED ORDER — LIDOCAINE 5 % EX PTCH: 2.0000 | MEDICATED_PATCH | CUTANEOUS | 0 refills | Status: DC

## 2023-12-07 MED ORDER — MORPHINE SULFATE (PF) 2 MG/ML IV SOLN
1.0000 mg | Freq: Once | INTRAVENOUS | Status: AC
  Administered 2023-12-07: 1 mg via INTRAVENOUS
  Filled 2023-12-07: qty 1

## 2023-12-07 MED ORDER — MORPHINE SULFATE (PF) 2 MG/ML IV SOLN
2.0000 mg | Freq: Once | INTRAVENOUS | Status: AC
  Administered 2023-12-07: 2 mg via INTRAVENOUS
  Filled 2023-12-07: qty 1

## 2023-12-07 NOTE — ED Provider Notes (Signed)
 [place] [facility] Provider Note   CSN: [id number] Arrival date & time: 12/07/23  1356     Patient presents with: Back Pain   Vanessa Fernandez is a 89 y.o. female past medical history significant for diverticulitis, A-fib, and GERD presents today for back pain x 2 weeks.  Patient reports her pain has worsened over the past 2 to 3 days.  Patient also endorses chills, headache, and nausea.  Patient denies trauma, numbness, tingling, loss of bowel or bladder control, saddle anesthesia, fever, cough, congestion, pain worse with movement, or any other complaints at this time.  Patient states the pain starts at the edge of her ribs on both sides and goes to the middle of her back.    Back Pain Associated symptoms: headaches        Prior to Admission medications   Medication Sig Start Date End Date Taking? Authorizing Provider  lidocaine  (LIDODERM ) 5 % Place 2 patches onto the skin daily. Remove & Discard patch within 12 hours or as directed by MD 12/07/23  Yes Avondre Richens N, PA-C  acetaminophen  (TYLENOL ) 500 MG tablet Take 1,000 mg by mouth every 6 (six) hours as needed. For pain     [provider]  apixaban  (ELIQUIS ) 5 MG TABS tablet Take 2.5 mg by mouth 2 (two) times daily. 07/21/19   [provider]  Cholecalciferol (VITAMIN D3) 50 MCG (2000 UT) capsule Take 2,000 Units by mouth daily.    [provider]  clorazepate  (TRANXENE ) 7.5 MG tablet Take 7.5 mg by mouth at bedtime as needed for sleep.    [provider]  cromolyn (NASALCROM) 5.2 MG/ACT nasal spray Place 1 spray into the nose daily as needed (nasal congestion).    [provider]  Cyanocobalamin  (VITAMIN B-12 PO) Take 1 tablet by mouth daily.    [provider]  docusate sodium  (COLACE) 100 MG capsule Take 1 capsule (100 mg total) by mouth every 12 (twelve) hours. 12/12/22   Randol Simmonds, MD  famotidine  (PEPCID ) 20 MG tablet Take 20 mg by  mouth daily.    [provider]  flecainide  (TAMBOCOR ) 50 MG tablet Take 25-50 mg by mouth See admin instructions. 25 mg in the morning, 50 mg in the evening.    [provider]  guaiFENesin -dextromethorphan (ROBITUSSIN DM) 100-10 MG/5ML syrup Take 5 mLs by mouth 3 (three) times daily as needed for cough. 05/24/22   Patsey Lot, MD  latanoprost  (XALATAN ) 0.005 % ophthalmic solution Place 1 drop into both eyes at bedtime. 05/30/19   [provider]  levothyroxine  (SYNTHROID ) 112 MCG tablet Take 112 mcg by mouth at bedtime. 12/29/19   [provider]  magnesium oxide (MAG-OX) 400 MG tablet Take 400 mg by mouth daily.    [provider]  Multiple Vitamin (MULTI-VITAMIN) tablet Take 1 tablet by mouth daily.    [provider]  ondansetron  (ZOFRAN -ODT) 8 MG disintegrating tablet Take 1 tablet (8 mg total) by mouth every 8 (eight) hours as needed for nausea or vomiting. 12/12/22   Randol Simmonds, MD  pantoprazole  (PROTONIX ) 40 MG tablet Take 1 tablet (40 mg total) by mouth daily. 12/12/22 01/11/23  Randol Simmonds, MD  sucralfate  (CARAFATE ) 1 g tablet Take 1 tablet (1 g total) by mouth 3 (three) times daily. 12/12/22   Randol Simmonds, MD    Allergies: Asa [aspirin], Bextra [valdecoxib], Celebrex [celecoxib], Codeine, Motrin [ibuprofen], Naprosyn [naproxen], and Sulfa antibiotics    Review of Systems  Constitutional:  Positive for chills.  Gastrointestinal:  Positive for nausea.  Musculoskeletal:  Positive for back pain.  Neurological:  Positive for headaches.    Updated Vital Signs BP (!) 118/49   Pulse 70   Temp 97.7 F (36.5 C) (Skin)   Resp 16   Ht 5' 3 (1.6 m)   Wt 81.6 kg   SpO2 99%   BMI 31.89 kg/m   Physical Exam Vitals and nursing note reviewed.  Constitutional:      General: She is not in acute distress.    Appearance: Normal appearance. She is well-developed. She is not ill-appearing.  HENT:     Head: Normocephalic and atraumatic.   Eyes:     Conjunctiva/sclera: Conjunctivae normal.  Cardiovascular:     Rate and Rhythm: Normal rate and regular rhythm.     Pulses: Normal pulses.     Heart sounds: Normal heart sounds. No murmur heard. Pulmonary:     Effort: Pulmonary effort is normal. No respiratory distress.     Breath sounds: Normal breath sounds.  Abdominal:     Palpations: Abdomen is soft.     Tenderness: There is no abdominal tenderness.  Musculoskeletal:        General: No swelling.     Cervical back: Neck supple.     Comments: Minimal tenderness to palpation of the low thoracic spine and along the posterior rib edges without obvious deformity, ecchymosis, erythema, or trauma.  Skin:    General: Skin is warm and dry.     Capillary Refill: Capillary refill takes less than 2 seconds.  Neurological:     General: No focal deficit present.     Mental Status: She is alert and oriented to person, place, and time.     Sensory: No sensory deficit.     Motor: No weakness.  Psychiatric:        Mood and Affect: Mood normal.     (all labs ordered are listed, but only abnormal results are displayed) Labs Reviewed  URINALYSIS, W/ REFLEX TO CULTURE (INFECTION SUSPECTED) - Abnormal; Notable for the following components:      Result Value   Bacteria, UA RARE (*)    All other components within normal limits  COMPREHENSIVE METABOLIC PANEL WITH GFR - Abnormal; Notable for the following components:   Creatinine, Ser 1.05 (*)    Total Protein 6.4 (*)    GFR, Estimated 48 (*)    All other components within normal limits  TROPONIN T, HIGH SENSITIVITY - Abnormal; Notable for the following components:   Troponin T High Sensitivity 20 (*)    All other components within normal limits  TROPONIN T, HIGH SENSITIVITY - Abnormal; Notable for the following components:   Troponin T High Sensitivity 20 (*)    All other components within normal limits  RESP PANEL BY RT-PCR (RSV, FLU A&B, COVID)  RVPGX2  CBC WITH  DIFFERENTIAL/PLATELET  LIPASE, BLOOD    EKG: EKG Interpretation Date/Time:  Friday December 07 2023 14:37:06 EDT Ventricular Rate:  81 PR Interval:  160 QRS Duration:  78 QT Interval:  356 QTC Calculation: 413 R Axis:   1  Text Interpretation: Sinus rhythm with Premature atrial complexes in a pattern of bigeminy Cannot rule out Anterior infarct , age undetermined Abnormal ECG When compared with ECG of 12-Dec-2022 15:20, PREVIOUS ECG IS PRESENT Confirmed by Zackowski, Scott [phone]) on 12/07/2023 6:51:25 PM  Radiology: CT T-SPINE NO CHARGE Result Date: 12/07/2023 CLINICAL DATA:  Back pain worsening over the  last 2-3 days EXAM: CT THORACIC SPINE WITHOUT CONTRAST TECHNIQUE: Multidetector CT images of the thoracic were obtained using the standard protocol without intravenous contrast. RADIATION DOSE REDUCTION: This exam was performed according to the departmental dose-optimization program which includes automated exposure control, adjustment of the mA and/or kV according to patient size and/or use of iterative reconstruction technique. COMPARISON:  12/12/2022 FINDINGS: Alignment: Mild upper thoracic kyphosis without substantial thoracic subluxation. Vertebrae: No fracture or acute bony findings. Thoracic spondylosis noted with bridging interbody spurring at the T4-5 level. Mild intervertebral disc space narrowing thoracic levels between T5 and T11. Paraspinal and other soft tissues: Please see dedicated CT chest report. Disc levels: No significant osseous central or foraminal impingement in the thoracic spine identified. There is evidence of a faintly calcified central disc protrusion at the T9-10 level, but only borderline central narrowing of the thecal sac at this level. IMPRESSION: 1. No acute findings. 2. Thoracic spondylosis and disc degeneration. 3. Faintly calcified central disc protrusion at the T9-10 level, but only borderline central narrowing of the thecal sac at this level. Electronically Signed    By: Ryan Salvage M.D.   On: 12/07/2023 18:35   CT Chest W Contrast Result Date: 12/07/2023 CLINICAL DATA:  Chest wall pain, nontraumatic. EXAM: CT CHEST WITH CONTRAST TECHNIQUE: Multidetector CT imaging of the chest was performed during intravenous contrast administration. RADIATION DOSE REDUCTION: This exam was performed according to the departmental dose-optimization program which includes automated exposure control, adjustment of the mA and/or kV according to patient size and/or use of iterative reconstruction technique. CONTRAST:  OMNIPAQUE  IOHEXOL  300 MG/ML  SOLN COMPARISON:  CT angiogram chest 12/12/2022 FINDINGS: Cardiovascular: No significant vascular findings. Normal heart size. No pericardial effusion. There are atherosclerotic calcifications of the aorta. Mediastinum/Nodes: The thyroid gland is not seen. There are no enlarged lymph nodes identified. Visualized esophagus is within normal limits. Lungs/Pleura: There are scattered 2 mm pulmonary nodules in the bilateral upper lobes similar to the prior study in 2021 and favored as benign. There is no focal lung consolidation, pleural effusion or pneumothorax. Upper Abdomen: Dilated intrahepatic bile ducts are unchanged. Gallbladder surgically absent. Musculoskeletal: No fracture is seen. There are moderate degenerative changes throughout the thoracic spine. IMPRESSION: 1. No acute cardiopulmonary process. 2. Stable 2 mm pulmonary nodules in the bilateral upper lobes favored as benign. 3. Stable dilated intrahepatic bile ducts. 4. Aortic atherosclerosis. Aortic Atherosclerosis (ICD10-I70.0). Electronically Signed   By: Greig Pique M.D.   On: 12/07/2023 17:49     Procedures   Medications Ordered in the ED  lidocaine  (LIDODERM ) 5 % 2 patch (has no administration in time range)  morphine  (PF) 2 MG/ML injection 2 mg (2 mg Intravenous Given 12/07/23 1618)  ondansetron  (ZOFRAN ) injection 4 mg (4 mg Intravenous Given 12/07/23 1618)  iohexol   (OMNIPAQUE ) 300 MG/ML solution 75 mL (100 mLs Intravenous Contrast Given 12/07/23 1703)  morphine  (PF) 2 MG/ML injection 1 mg (1 mg Intravenous Given 12/07/23 1822)                                    Medical Decision Making Amount and/or Complexity of Data Reviewed Labs: ordered. Radiology: ordered.  Risk Prescription drug management.   This patient presents to the ED for concern of back pain, this involves an extensive number of treatment options, and is a complaint that carries with it a high risk of complications and morbidity.  The differential  diagnosis includes STEMI, NSTEMI, arrhythmia, anemia, vertebral fracture, appendicitis, pancreatitis, choledocholithiasis, acute cholecystitis,   Co morbidities / Chronic conditions that complicate the patient evaluation  GERD, diverticulitis, A-fib   Additional history obtained:  Additional history obtained from EMR External records from outside source obtained and reviewed including care everywhere notes   Lab Tests:  I Ordered, and personally interpreted labs.  The pertinent results include: Respiratory panel negative, CBC WNL, delta troponin flat at 20 and 20, elevated creatinine at 1.05, mildly decreased total protein at 6.4, UA with rare bacteria   Imaging Studies ordered:  I ordered imaging studies including CT chest with contrast I independently visualized and interpreted imaging which showed no acute cardiopulmonary process.  Stable 2 mm pulmonary nodules in the bilateral upper lobes.  Stable dilated intrahepatic bile ducts.  Aortic arthrosclerosis. I agree with the radiologist interpretation CT T-spine no charge which showed no acute findings, thoracic spondylosis and disc degeneration.  Faintly calcified central disc protrusion at the T9-10 level but only borderline central narrowing of the thecal sac at this level   Cardiac Monitoring: / EKG:  The patient was maintained on a cardiac monitor.  I personally viewed and  interpreted the cardiac monitored which showed an underlying rhythm of: Sinus rhythm with PAC bigeminy   Problem List / ED Course / Critical interventions / Medication management  I ordered medication including morphine  and Zofran  Reevaluation of the patient after these medicines showed that the patient patient's pain had improved I have reviewed the patients home medicines and have made adjustments as needed   Test / Admission - Considered:  Considered for admission or further workup however patient's vital signs, physical exam, labs, and imaging of been reassuring.  Patient has no red flag signs or symptoms concerning for cauda equina syndrome, spinal cord compression, or epidural abscess.  Patient advised to use Lidoderm  patches and take Tylenol  Motrin as needed for pain.  Patient to follow-up with [facility] if her symptoms persist for further evaluation workup.  Patient given return precautions.  I feel patient is safe for discharge at this time.     Final diagnoses:  Bilateral thoracic back pain, unspecified chronicity    ED Discharge Orders          Ordered    lidocaine  (LIDODERM ) 5 %  Every 24 hours        12/07/23 1927               Francis Ileana SAILOR, PA-C 12/07/23 1935    Neysa Caron PARAS, DO 12/10/23 1500    Neysa Caron PARAS, DO 12/21/23 1208

## 2023-12-07 NOTE — ED Notes (Signed)
 Pt. Reports each side at the rib cage L and R hurt and the back hurts as well for the last 2 days.  Pt. Is on a new GERD med  by her [address] Dr. And has sense stopped in the last 3-4 days or so.  Pt. Unsure if this caused it.

## 2023-12-07 NOTE — Discharge Instructions (Addendum)
 Today you were seen for back pain.  Please follow-up with [facility] if your symptoms persist for further evaluation workup.  Please pick up your Lidoderm  patches and use as needed for pain.  You may also alternate taking Tylenol  and Motrin as needed for pain.  Please return to the ED if you have paralysis, loss of bowel or bladder control, or weakness.  Thank you for letting us  treat you today. After reviewing your labs and imaging, I feel you are safe to go home. Please follow up with your PCP in the next several days and provide them with your records from this visit. Return to the Emergency Room if pain becomes severe or symptoms worsen.

## 2023-12-07 NOTE — ED Notes (Signed)
 Pt. Came back from  CT scan and reports pain in the L upper rib area.  Reported to EDP caring for her.  Pt. Son at bedside.

## 2023-12-07 NOTE — ED Notes (Signed)
 AVS provided by edp was reviewed with pt. Pt verbalized understanding with no additional questions at this time. Pharmacy verified with pt. Pt wheelchair to car to go home with son at bedside.

## 2023-12-07 NOTE — ED Triage Notes (Addendum)
 Pt here with back pain that started about 2 weeks ago, but has worsened in the past 2-3 days. Denies known injury. Has used heating pads and Tylenol  with temporary relief. Pt also c/o body chills, headache, nausea, lightheadedness. Pt indicates that the back pain is on the sides of her chest wrapping to back and into upper abdomen.   Vanessa FORBES Molt, RN

## 2024-01-18 ENCOUNTER — Encounter (HOSPITAL_BASED_OUTPATIENT_CLINIC_OR_DEPARTMENT_OTHER): Payer: Self-pay

## 2024-01-18 ENCOUNTER — Emergency Department (HOSPITAL_BASED_OUTPATIENT_CLINIC_OR_DEPARTMENT_OTHER)
Admission: EM | Admit: 2024-01-18 | Discharge: 2024-01-18 | Disposition: A | Discharge status: Home / Self Care | Source: Ambulatory Visit | Attending: Emergency Medicine | Admitting: Emergency Medicine

## 2024-01-18 ENCOUNTER — Emergency Department (HOSPITAL_BASED_OUTPATIENT_CLINIC_OR_DEPARTMENT_OTHER)

## 2024-01-18 ENCOUNTER — Other Ambulatory Visit: Payer: Self-pay

## 2024-01-18 DIAGNOSIS — Z7901 Long term (current) use of anticoagulants: Secondary | ICD-10-CM | POA: Insufficient documentation

## 2024-01-18 DIAGNOSIS — M791 Myalgia, unspecified site: Secondary | ICD-10-CM | POA: Diagnosis not present

## 2024-01-18 DIAGNOSIS — G8929 Other chronic pain: Secondary | ICD-10-CM | POA: Diagnosis not present

## 2024-01-18 DIAGNOSIS — M549 Dorsalgia, unspecified: Secondary | ICD-10-CM | POA: Insufficient documentation

## 2024-01-18 DIAGNOSIS — M7918 Myalgia, other site: Secondary | ICD-10-CM

## 2024-01-18 NOTE — ED Notes (Signed)
 Pt was told by massage therapist she had a pulled muscle in her armpit region that radiates to her mid back. Tender to palpation and worse with range of motion. PA in room and palpated pain and advised no spinal tenderness, all pain paraspinal.

## 2024-01-18 NOTE — ED Triage Notes (Signed)
 Sent by PCP. States she is in and out of a fib at night for 2 weeks, having worsening upper and lower back pain for 2-3 weeks.   Reports intermittent chest pain, SHOB, nausea, lightheadedness

## 2024-01-18 NOTE — ED Notes (Signed)

## 2024-01-18 NOTE — ED Notes (Signed)
 Pt has no complaints at present and walked to the bathroom with her cane without incident.

## 2024-01-18 NOTE — Discharge Instructions (Addendum)
 ### Back Pain Management Guide     **Understanding Your Back Pain**      Chronic back pain is common in older adults, especially with conditions like kyphosis (curved spine), osteopenia (low bone density), and degenerative changes in the spine. Myofascial pain means the muscles and soft tissues in your back are sore or tight. Managing this pain safely is important, especially since you are taking apixaban  (a blood thinner), which can increase the risk of bleeding.      **Physical Therapy**      Physical therapy is one of the most effective ways to help with chronic back pain. A physical therapist can teach you gentle exercises to strengthen your back and core muscles, improve your posture, and help with balance. These exercises can reduce pain, help you move better, and lower your risk of falls and fractures.[1][2][3][4][5] For people with kyphosis and osteopenia, therapy often focuses on:      - **Weight-bearing exercises** (like walking or standing activities)      - **Back strengthening** (gentle movements to support your spine)      - **Balance training** (to prevent falls)      - **Posture correction** (to help with kyphosis)      Physical therapy is safe and can be adjusted to your abilities. Always let your therapist know about your blood thinner medication, so they can avoid any treatments that might cause bruising or bleeding.      **Supportive Care**      Supportive care means using simple strategies to make daily life easier and more comfortable:      - **Heat or cold packs** can help relax sore muscles (use with caution to avoid skin injury).      - **Supportive devices** like braces or orthotics may help with posture and stability, but should be used only if recommended by your healthcare team.[5]      - **Safe home environment**: Remove tripping hazards, use grab bars, and keep rooms well-lit to prevent falls.      **Physiatry (Physical Medicine and Rehabilitation)**       A physiatrist is a doctor who specializes in rehabilitation and pain management. They can help coordinate your care, recommend therapies, and adjust your treatment plan as needed. They may suggest:      - **Multidisciplinary rehabilitation**: Combining physical therapy with education and psychological support can improve pain and function, especially for people with low activity levels or emotional stress related to pain.[2][3][4][6]      - **Education about pain**: Learning about your condition and how to manage it can help you feel more in control and reduce worry.[1][6]      **Other Pain Control Methods**      Non-drug treatments are preferred for chronic back pain, especially in older adults:      - **Exercise**: Regular movement, even gentle stretching or walking, is proven to help with pain and function. There is no single best exercise--choose activities you enjoy and can do safely.[1][2][3][4][7][6]      - **Massage therapy**: Gentle massage or myofascial release can provide some relief for muscle pain, but should be done by a trained professional who knows you are on a blood thinner.[3][4]      - **Mindfulness and relaxation**: Techniques like deep breathing, meditation, or guided imagery can help manage pain and improve mood.[2][3][4]      - **Cognitive behavioral therapy (CBT)**: Talking with a counselor or psychologist can help you cope with pain and improve your quality of life.[1][2][3][4][6]      - **  Acupuncture, yoga, and tai chi**: These may help some people with chronic pain, but should be discussed with your healthcare team to make sure they are safe for you.[2][3][4]      **Medication Safety**      Because you are taking apixaban , it is important to avoid treatments that increase bleeding risk. Nonsteroidal anti-inflammatory drugs (NSAIDs) and some other pain medicines can interact with blood thinners or cause side effects in older adults, so they are usually not  recommended unless your doctor says it is safe.[2][8][9][10] Acetaminophen  may be used for mild pain, but may not be very effective for chronic back pain.[2][8][7][9][10] Always talk to your doctor before starting any new medication.      **Staying Safe**      - Tell all your healthcare providers about your medications, especially apixaban .      - Report any unusual bruising, bleeding, or falls right away.      - Stay active within your limits, and ask for help when needed.      **Summary**      Managing chronic back pain in older adults works best with a combination of physical therapy, supportive care, and non-drug treatments. These approaches are safe, effective, and can be tailored to your needs. Your healthcare team will work with you to find the best plan to keep you comfortable, active, and safe.[1][2][3][4][8][7][6][9][5][10]      ### References  1. Nonspecific Low Back Pain. Chiarotto A, Koes BW. The [place] Journal of Medicine. 2022;386(18):1732-1740. doi:10.1056/[id number]. 2. Noninvasive Treatments for Acute, Subacute, and Chronic Low Back Pain: A Clinical Practice Guideline From the [employer] of Physicians. Qaseem A, Wilt TJ, McLean RM, et al. Annals of Internal Medicine. 2017;166(7):514-530. doi:10.7326/M16-2367. 3. Chronic Musculoskeletal Pain: Nonpharmacologic, Noninvasive Treatments. Darrel DM. American Family Physician. 2020;102(8):465-477. 4. CDC Clinical Practice Guideline for Prescribing Opioids for Pain - [place] , 2022. Dowell D, Ragan KR, Joshua DINGS, Gwyneth CLEMETINE Gigi FABIENE MMWR. Recommendations and Reports : Morbidity and Mortality Weekly Report. Recommendations and Reports. 2022;71(3):1-95. doi:10.15585/mmwr.[id number]. 5. American Association of Clinical Endocrinologists/American College of Endocrinology Clinical Practice Guidelines for the Diagnosis and Treatment of Postmenopausal Osteoporosis-2020 Update. Camacho PM, Petak SM, Waylon SAILOR, et al. Endocrine  Practice : Official Journal of the [employer] of Endocrinology and the American Association of Clinical Endocrinologists. 2020;26(Suppl 1):1-46. doi:10.4158/GL-[id number]. 6. Best Evidence Rehabilitation for Chronic Pain Part 3: Low Back Pain. Malfliet A, Ickmans K, Huysmans E, et al. Journal of Clinical Medicine. 2019;8(7):E1063. doi:10.3390/jcm8071063. 7. PEER Systematic Review of Randomized Controlled Trials: Management of Chronic Low Back Pain in Primary Care. Kolber MR, Vertell JINNY Debby KATHEE, et al. [facility] Physician [employer] [address] [place]. 2021;67(1):e20-e30. doi:10.46747/cfp.[id number]. 8. Pharmacotherapy for Spine-Related Pain in Older Adults. Angele ORLINDA Bailiff MD. Drugs & Aging. 2022;39(7):523-550. doi:10.1007/s40266-022-00946-x. 9. Management of Persistent Pain in the Older Patient: A Clinical Review. Makris UE, Queen GORY, Sima KATHEE [facility]. JAMA. 2014;312(8):825-36. doi:10.1001/jama.7985.0594. 10. Diagnosis and Management of Lumbar Spinal Stenosis: A Review. Micky CHILDES, Dwan BIRD, [place], [facility]. JAMA. 2022;327(17):1688-1699. doi:10.1001/jama.7977.4078.

## 2024-01-18 NOTE — ED Provider Notes (Signed)
 [facility] Provider Note   CSN: [id number] Arrival date & time: 01/18/24  1657     Patient presents with: Palpitations   Vanessa Fernandez is a 89 y.o. female presents emergency department with a chief complaint of back pain.  Patient reports that yesterday she called her primary care doctor about her chronic back pain which she has had for years.  She states that she also mentioned that she has been having intermittent episodes of her atrial fibrillation that sometimes wake her out of her sleep at night.  She is currently on flecainide  and Eliquis .  She reports that today someone from her doctor's office called her to tell her to come to the emergency department.  She denies any chest pain, shortness of breath, or palpitations at this time.  Patient reports that the entire reason she called her doctor was because her back pain is difficult to control.  She is on Eliquis .  She has been using 1 g of Tylenol  3 times daily for pain control.  She has also been using lidocaine  patches heat and massage therapy.  She reports that sometimes her pain becomes so significant that it makes her nauseous and she has to stop.  She gets pain in her shoulder blades when she is standing and stirring or knitting and looking down.  She gets pain in her lower back.  She states that she has difficulty pinpointing the pain in her back because it seems to migrate.  She denies any new saddle anesthesia lower extremity weakness fevers urinary symptoms inability to ambulate disequilibrium or ataxia.    Palpitations      Prior to Admission medications   Medication Sig Start Date End Date Taking? Authorizing Provider  acetaminophen  (TYLENOL ) 500 MG tablet Take 1,000 mg by mouth every 6 (six) hours as needed. For pain     [provider]  apixaban  (ELIQUIS ) 5 MG TABS tablet Take 2.5 mg by mouth 2 (two) times daily. 07/21/19   [provider]  Cholecalciferol  (VITAMIN D3) 50 MCG (2000 UT) capsule Take 2,000 Units by mouth daily.    [provider]  clorazepate  (TRANXENE ) 7.5 MG tablet Take 7.5 mg by mouth at bedtime as needed for sleep.    [provider]  cromolyn (NASALCROM) 5.2 MG/ACT nasal spray Place 1 spray into the nose daily as needed (nasal congestion).    [provider]  Cyanocobalamin  (VITAMIN B-12 PO) Take 1 tablet by mouth daily.    [provider]  docusate sodium  (COLACE) 100 MG capsule Take 1 capsule (100 mg total) by mouth every 12 (twelve) hours. 12/12/22   Randol Simmonds, MD  famotidine  (PEPCID ) 20 MG tablet Take 20 mg by mouth daily.    [provider]  flecainide  (TAMBOCOR ) 50 MG tablet Take 25-50 mg by mouth See admin instructions. 25 mg in the morning, 50 mg in the evening.    [provider]  guaiFENesin -dextromethorphan (ROBITUSSIN DM) 100-10 MG/5ML syrup Take 5 mLs by mouth 3 (three) times daily as needed for cough. 05/24/22   Patsey Lot, MD  latanoprost  (XALATAN ) 0.005 % ophthalmic solution Place 1 drop into both eyes at bedtime. 05/30/19   [provider]  levothyroxine  (SYNTHROID ) 112 MCG tablet Take 112 mcg by mouth at bedtime. 12/29/19   [provider]  lidocaine  (LIDODERM ) 5 % Place 2 patches onto the skin daily. Remove & Discard patch within 12 hours or as directed by MD 12/07/23  Zackowski, Scott, MD  magnesium oxide (MAG-OX) 400 MG tablet Take 400 mg by mouth daily.    [provider]  Multiple Vitamin (MULTI-VITAMIN) tablet Take 1 tablet by mouth daily.    [provider]  ondansetron  (ZOFRAN -ODT) 8 MG disintegrating tablet Take 1 tablet (8 mg total) by mouth every 8 (eight) hours as needed for nausea or vomiting. 12/12/22   Randol Simmonds, MD  pantoprazole  (PROTONIX ) 40 MG tablet Take 1 tablet (40 mg total) by mouth daily. 12/12/22 01/11/23  Randol Simmonds, MD  sucralfate  (CARAFATE ) 1 g tablet Take 1 tablet (1 g total) by mouth 3 (three)  times daily. 12/12/22   Randol Simmonds, MD    Allergies: Fentanyl , Asa [aspirin], Bextra [valdecoxib], Celebrex [celecoxib], Codeine, Motrin [ibuprofen], Naprosyn [naproxen], and Sulfa antibiotics    Review of Systems  Cardiovascular:  Positive for palpitations.    Updated Vital Signs BP (!) 142/75 (BP Location: Right Arm)   Pulse 86   Temp 97.9 F (36.6 C)   Resp 20   Wt 82.6 kg   SpO2 99%   BMI 32.24 kg/m   Physical Exam Vitals and nursing note reviewed.  Constitutional:      General: She is not in acute distress.    Appearance: She is well-developed. She is not diaphoretic.  HENT:     Head: Normocephalic and atraumatic.     Right Ear: External ear normal.     Left Ear: External ear normal.     Nose: Nose normal.     Mouth/Throat:     Mouth: Mucous membranes are moist.  Eyes:     General: No scleral icterus.    Conjunctiva/sclera: Conjunctivae normal.  Cardiovascular:     Rate and Rhythm: Normal rate and regular rhythm.     Heart sounds: Normal heart sounds. No murmur heard.    No friction rub. No gallop.  Pulmonary:     Effort: Pulmonary effort is normal. No respiratory distress.     Breath sounds: Normal breath sounds.  Abdominal:     General: Bowel sounds are normal. There is no distension.     Palpations: Abdomen is soft. There is no mass.     Tenderness: There is no abdominal tenderness. There is no guarding.  Musculoskeletal:     Cervical back: Normal range of motion.     Comments: No midline spinal tenderness, multiple areas of myofascial tenderness all along the back and shoulder blade region.  Kyphotic curve of the thoracic spine noted  Skin:    General: Skin is warm and dry.  Neurological:     Mental Status: She is alert and oriented to person, place, and time.  Psychiatric:        Behavior: Behavior normal.     (all labs ordered are listed, but only abnormal results are displayed) Labs Reviewed - No data to display  EKG: None  Radiology: No  results found.   Procedures   Medications Ordered in the ED - No data to display                                  Medical Decision Making Amount and/or Complexity of Data Reviewed Radiology: ordered.   Patient seen in shared visist with Dr. Pamella. patient is not complaining of palpitations or chest pain and she states the reason for her phone call was for her back pain which appears to be a chronic issue for  her. has a normal rate and is currently not in atrial fibrillation at all. Patient is in normal sinus rhythm at a rate of 83 on her EKG which I visualized and interpreted.  Given the fact that she is not in atrial fibrillation or having any symptoms at this time I do not feel that she has to have lab evaluations.  This is discussed with attending physician who is in agreement.  I also visualized a portable 1 view chest x-ray which shows no acute findings. Patient has no recent falls.  I reviewed previous CT abdomen and pelvis, CT C-spine, recent CT chest within the last month.  All of these show spinal degenerative changes.  She has some thoracic spondylolysis.  She has no concerning findings for acute fracture, radicular the on examination.  Similarly patient is not having any signs or symptoms of an autoimmune process such as weakness, fevers, severe fatigue or malaise that would be suggestive of something like RA or polymyalgia rheumatica. She has reproducible myofascial tenderness and I suspect that this is the underlying cause of her pain along with her age and the changes.  Given these findings, recent imaging patient will be discharged with referral to follow-up with her primary care physician.  She may benefit from a PT eval and treat of her myofascial back pain.  She is currently doing a lot of other supportive care techniques such as massage therapy Tylenol  ice heat and Lidoderm  which I encouraged her to continue.  Discussed the gamete goal care    Final diagnoses:   Myofascial pain  Chronic back pain greater than 3 months duration    ED Discharge Orders     None          Arloa Chroman, PA-C 01/18/24 2024    Pamella Sharper A, DO 01/23/24 1611

## 2024-04-29 NOTE — Discharge Summary (Signed)
 [facility] Hospitalist Discharge Summary  Identifying Information:  Vanessa Fernandez [birth year] [id number]  Admit date: 04/25/2024  Discharge date: 04/29/2024  Discharge Service: [facility] Hospitalist  Discharge Attending Physician:Mohammad GORMAN Barrs, MD  Discharge to: Home  Discharge Diagnoses: Principal Problem:   Chest pain Active Problems:   Pulmonary emboli    (CMD)   OSA on CPAP   Hypomagnesemia   Pacemaker   Longstanding persistent atrial fibrillation    (CMD)   Chronic atrial fibrillation    (CMD)   Hypothyroid   Pericarditis (CMD)    Per H&P HPI: Vanessa Fernandez is a 89 y.o. female with history of thyroid cancer status post thyroidectomy 8/19, sciatica, insomnia, OSA on CPAP, persistent atrial fibrillation status post ablation, and recent pacemaker placement 3 days ago11/25/2025 on Eliquis  followed by Dr Epifanio, hx of traumatic intracranial hematoma, IBS, esophageal stricture, GERD, arthritis, chronic PE, and osteopenia who comes to the ED today with complaints of left-sided substernal chest pain with radiation to the left shoulder.  Patient states that the chest pain woke her up out of her sleep this morning around 2 AM and she felt short of breath. Patient states that she has pain radiates to the left shoulder and describes it as aching. She states that it comes and goes. Nothing makes it better and deep breathing makes it worse. She Patient states that she does have a history of GERD but this is a completely different pain and much worse than GERD episodes that she has had previously. Patient did receive one-time nitro with no improvement of chest pain.  Patient's son at bedside states that she did stop her Eliquis  on Sunday for pacemaker placement and she was scheduled to resume it today.   ED course: Given 1 dose of nitro, 1 g IV magnesium, and cardiology was consulted All labs and imaging reviewed by me personally: All labs unremarkable with the exception of magnesium at 1.6,  chest x-ray negative for acute process, EKG sinus rhythm, shortened QRS complex, normal intervals, nonspecific T wave changes present, no ST elevation noted,no changes compared to previous EKG    Hospital Course:  Chest pain Pacemaker Chronic atrial fibrillation    (CMD) Pulmonary emboli    (CMD) -Ruled out ACS, echo revealed normal EF, noted severe pulmonary hypertension. Seen by cardiology team concerned for pericarditis, normal inflammatory marker, dose of Toradol helped her significantl, so started on colchicine, overall her chest pain has improved, she is tolerating diet.  Had CTA chest  negative for PE or pneumonia.  Still having some pleuritic chest pain but much better compared to 2 days ago. Repeat TTE same with mild pericardial effusions.  Her chest pain resolved, ambulated in hallway without oxygen requirement or chest pain Cardiology recommended 2 weeks of colchicine 0.6 mg daily  ( received 4 days inpatient and 10 additional days at discharge ).     Acute hypoxic respiratory failure- currently requiring 1-2 L of oxygen, which has been weaned down, currently stable on room air.  Given low-dose of Lasix one-time. Weaned off oxygen.    History of paroxysmal A-fib with sinus pause/bradycardia status post dual-chamber pacemaker on 04/22/24.   Currently stable on flecainide , on eliquis     Fever-spiked fever, unclear reason. Negative work up Negative upper respiratory viral panel, negative chest x-ray last evening revealed atelectasis, ordered spirometry,  negative procalcitonin. Patient was fever free for 24 hours. She requested to be discharge home. No further work up needed, if worsens fevers recommend to seek medical  attention.    Hypotension- BP soft, received IV fluid, stable blood pressure today, not on antihypertensive. Stable at discharge home.    Acute kidney injury, creatinine went up from 1.10-1.38, likely due to hypotension, given gentle IV fluid, creatinine is back to  normal.    Hypomagnesemia received IV magnesium, continue oral supplement-    Hypothyroidism continue levothyroxine    Spoke with the son at the bedside in detail   -Seen by PT today did well, recommended home health   Post Discharge Follow Up Issues:   Procedures: None No admission procedures for hospital encounter. _____________________________________________________________________________ Discharge Day Services: BP (!) 112/48 (BP Location: Right arm, Patient Position: Lying)   Pulse 94   Temp 99 F (37.2 C) (Oral)   Resp 18   Ht 1.6 m (5' 3)   Wt 82.4 kg (181 lb 10.5 oz)   SpO2 92%   BMI 32.18 kg/m  Pt seen on the day of discharge and determined appropriate for discharge.  GEN: NAD, lying in bed EYES: EOMI ENT: MMM CV: RRR PULM: CTA B ABD: soft, NT/ND, +BS EXT: No edema  Condition at Discharge: good  Length of Discharge: I spent 36 mins in the discharge of this patient. _____________________________________________________________________________ Discharge Medications: Patient Instructions:    Discharge Medications     New Medications      Sig Disp Refill Start End  colchicine 0.6 mg tablet  Take 1 tablet (0.6 mg total) by mouth daily for 10 days.  10 tablet  0  April 30, 2024    magnesium oxide 400 mg (241 mg magnesium) Tab  Take 1 tablet (400 mg total) by mouth 2 (two) times a day for 5 days.  10 tablet  0         Medications To Continue      Sig Disp Refill Start End  acetaminophen  650 mg ER tablet Commonly known as: TYLENOL   Take 650 mg by mouth every 6 (six) hours as needed (pain).   0     apixaban  5 mg Tab Commonly known as: Eliquis   Take 1 tablet (5 mg total) by mouth 2 (two) times a day. RESUME in 48 hours POST pacemaker implant-11/28.   0     doxycycline 100 mg tablet Commonly known as: VIBRA-TABS  Take 1 tablet (100 mg total) by mouth 2 (two) times a day for 7 days. Take with 8 oz water. Do not lie down for at least 30  minutes after.  14 tablet  0     famotidine  20 mg tablet Commonly known as: PEPCID   Take 1 tablet (20 mg total) by mouth 2 (two) times a day.  180 tablet  3     flecainide  50 mg tablet Commonly known as: TAMBOCOR   Take 1 tablet (50 mg total) by mouth 2 (two) times a day.  180 tablet  2     hyoscyamine sulfate 0.125 mg disintegrating/SL tablet Commonly known as: LEVSIN  Place 1 tablet (0.125 mg total) under the tongue every 4 (four) hours as needed (abdominal cramps).  60 tablet  1     levothyroxine  112 mcg tablet Commonly known as: SYNTHROID   Take 1 tablet (112 mcg total) by mouth every morning.  90 tablet  3     ondansetron  4 mg tablet Commonly known as: ZOFRAN   Take 1 tablet (4 mg total) by mouth every 8 (eight) hours as needed for nausea or vomiting.  20 tablet  0  _____________________________________________________________________________ Pending Test Results (if blank, then none):    Most Recent Labs:  Lab Results  Component Value Date/Time   WBC 5.52 04/29/2024 0947   RBC 3.00 (L) 04/29/2024 0947   HGB 9.1 (L) 04/29/2024 0947   HCT 27.2 (L) 04/29/2024 0947   PLT 200 04/29/2024 0947    Lab Results  Component Value Date   WBC 5.52 04/29/2024   HGB 9.1 (L) 04/29/2024   HCT 27.2 (L) 04/29/2024   PLT 200 04/29/2024    Lab Results  Component Value Date   CO2 27 04/29/2024   BUN 22 04/29/2024   GLUCOSE 108 (H) 04/29/2024   CREATININE 0.99 04/29/2024   CALCIUM  8.6 04/29/2024   ALBUMIN 3.0 (L) 04/28/2024   AST 27 04/28/2024   ALT 17 04/28/2024    Lab Results  Component Value Date   NA 136 04/29/2024   K 3.5 04/29/2024   CL 102 04/29/2024   CO2 27 04/29/2024   BUN 22 04/29/2024   CREATININE 0.99 04/29/2024   CALCIUM  8.6 04/29/2024   MG 1.7 (L) 04/29/2024    Lab Results  Component Value Date   BILITOT 0.8 04/28/2024   BILIDIR 0.1 05/29/2020   PROT 5.2 (L) 04/28/2024   ALBUMIN 3.0 (L) 04/28/2024   ALT 17 04/28/2024   AST  27 04/28/2024    Lab Results  Component Value Date   INR 1.7 (H) 04/27/2024   Hospital Radiology:  Transthoracic echo (TTE) limited Result Date: 04/29/2024                                                                                                     Version  1                                                                                                     Study ID  [id number]                                                                  +--------------------------------------------------+                           +----------+  [facility]                                                                                                                           [facility]                                       +--------------------------------------------------+                                                                                                                                           +----------+ [facility] [address] Transthoracic Echocardiogram Report Name  Vanessa Fernandez, Vanessa Fernandez Date  04-29-2024, 11  03 AM                Height  62.99 in MRN  [id number]                                      Patient Location  [facility]                     Weight  181.661 lb DOB  01-Jun-1925  [id number]                        Birth Gender  Female                             BSA  1.86 m Age  3 Years                                       Ethnicity  1                                    BP  114 - 52 mmHg  HR  89 bpm Reason For Study  Dyspnea, cardiac origin suspected Ordering Physician  LORELI MICKEY ELSIE GILMORE Performed By  ZELL DENNIS Referring Physician  LORELI MICKEY ELSIE GILMORE  PROCEDURE Limited echo to evaluate left ventricular function. The left ventricular size is normal. Mild left ventricular hypertrophy LV ejection fraction = 60-65%. The right ventricle is normal size. The right ventricular systolic function is normal. There is mild mitral regurgitation. There is mild tricuspid regurgitation. Estimated right ventricular systolic pressure is 45 mmHg. IVC size was normal. There is trivial pericardial effusion. There are no echocardiographic indications of cardiac tamponade. LEFT VENTRICLE The left ventricular size is normal. Mild left ventricular hypertrophy. LV ejection fraction = 60-65%. RIGHT VENTRICLE The right ventricle is normal size. The right ventricular systolic function is normal. LEFT ATRIUM The left atrial size is normal. RIGHT ATRIUM Right atrial size is normal. There is no Doppler evidence for a patent foramen ovale. AORTIC VALVE Aortic valve calcification. There is no aortic stenosis. There is no aortic regurgitation. MITRAL VALVE There is mild mitral annular calcification. There is mild mitral regurgitation. TRICUSPID VALVE Structurally normal tricuspid valve. There is mild tricuspid regurgitation. Estimated right ventricular systolic pressure is 45 mmHg. Mild pulmonary hypertension. PULMONIC VALVE Limited study, not performed. VENOUS IVC size was normal. EFFUSION There is trivial pericardial effusion. There are no echocardiographic indications of cardiac tamponade. MMode-2D Measurements & Calculations EDV MOD-sp2   58.2 ml                 EDV MOD-sp4   74.2 ml                 EF A4C  78.8 %          ESV MOD-sp2   18.3 ml ESV MOD-sp4   15.7 ml                 IVSd  1.00 cm                          LA dim  4.9 cm         LVIDd  3.9 cm LVIDs  2.5 cm                           LVPWd  1.07 cm                        SV A4C  58.5 ml Doppler Measurements & Calculations RAP systole  8.0 mmHg                 TR max PG  37.2 mmHg                   TR max vel  305.1 cm-sec Other  Measurements & Calculations BSA  1.86 m                          RVSP TR   45.2 mmHg                    SI MOD-sp4   31.5 ml-m SV MOD-sp4   58.5 ml ______________________________________________________________________________  MD Gerhardt Mana, MD, [phone]       04-29-2024, 2  11 PM  ECG 12 lead Result Date: 04/29/2024 Ventricular Rate                   67        BPM                 Atrial Rate                        67        BPM                 P-R Interval                       154       ms                  QRS Duration                       190       ms                  Q-T Interval                       522       ms                  QTC Calculation Bazett             551       ms                  Calculated P Axis                  270       degrees             Calculated R Axis                  -86       degrees             Calculated T Axis                  71        degrees             AV dual-paced rhythm When compared with ECG of 25-Apr-2024 03 48, Electronic pacemaker detected has replaced Sinus rhythm Confirmed by Debora Mealing  [id number]  on 04-29-2024 11 32 01 AM  XR Chest 1 View Result Date: 04/28/2024 XR CHEST 1 VIEW, 04/27/2024 6:00 PM INDICATION:Shortness of breath 5 days following pacemaker implantation COMPARISON: 04/17/2024. FINDINGS: Supportive devices: Intact leads of cardiac rhythm maintenance device. No fractured or abandoned lead. Cardiovascular/lungs/pleura: Stable cardiac mediastinal silhouette. Low lung volume. Similar streaky opacities in bilateral lung bases. Other: No interval osseous change.   Similar bibasilar atelectasis.   Transthoracic echo (TTE) complete Result Date: 04/25/2024                                                   [facility]                                                 [facility]                                                   Cardiac                                                 Ultrasound-                                                 [place],                                                [facility]                                                    Bldg                                                  [address]                                                 [place]  [place] [address]                                  Transthoracic Echocardiogram Report Name  Vanessa Fernandez, Vanessa Fernandez                        Study Date  04-25-2024               Height  63 in MRN  [id number]                                   Patient Location  [facility]         Weight  186 lb DOB  1925-08-18                                 Gender  Female                       BSA  1.9 m2 Age  37 yrs                                     Ethnicity  1                         BP  156-56 mmHg Reason For Study  chest pain                                                         HR  82 Ordering Physician  WRIGHT, HEATHER ANN         Performed By  ZELL DENNIS Referring Physician  WRIGHT, HEATHER ANN - - PROCEDURE Image Quality  Technically adequate. - SUMMARY Suboptimal image quality precludes precise LV and RV chamber quantification; gross estimates as noted below. The left ventricular size is normal. Left ventricular systolic function is normal. Moderate diastolic dysfunction [pseudonormal pattern] with elevated left atrial pressure. Unable to fully assess LV regional wall motion Probably normal RV size and systolic function. Estimated right ventricular systolic pressure is 70 mmHg. Severe pulmonary hypertension. The left atrium is moderately dilated. There is mild mitral regurgitation. There is mild tricuspid regurgitation. Dilated IVC with decreased inspiratory collapse. Pulmonary B-lines noted,  suggesting pulmonary edema. There is no pericardial effusion. Compared to the prior study, estimated RVSP has increased. - FINDINGS LEFT VENTRICLE The left ventricular size is normal. Left ventricular systolic function is normal. Moderate diastolic dysfunction [pseudonormal pattern] with elevated left atrial pressure. Unable to fully assess LV regional wall motion. - RIGHT VENTRICLE There is a pacemaker lead in the right ventricle. Probably normal RV size and systolic function. LEFT ATRIUM The left atrium is moderately dilated. RIGHT ATRIUM Right atrial size is normal. There is no Doppler evidence for a patent foramen ovale. - AORTIC VALVE Aortic valve calcification. There is no aortic stenosis. There is no aortic regurgitation. - MITRAL VALVE There is mild mitral annular calcification. There is mild mitral regurgitation. - TRICUSPID VALVE Structurally normal tricuspid valve. There is mild  tricuspid regurgitation. Estimated right ventricular systolic pressure is 70 mmHg. Severe pulmonary hypertension. - PULMONIC VALVE Structurally normal pulmonic valve. There is no pulmonic valvular regurgitation. There is no pulmonic valvular stenosis. - ARTERIES The aortic sinus is normal size. The ascending aorta is normal size. - VENOUS Dilated IVC with decreased inspiratory collapse. - EFFUSION There is no pericardial effusion. - - MMode-2D Measurements & Calculations IVSd  0.74 cm        LA dim  4.4 cm             ESV MOD-sp4   23.8 ml     asc Aorta Diam  2.4 cm LVIDd  4.8 cm        EDV MOD-sp4   71.8 ml      EDV MOD-sp2   58.7 ml LVPWd  1.1 cm                                   ESV MOD-sp2   15.8 ml LVIDs  2.6 cm           _________________________________________________________________________________ SV MOD-sp4   48.0 ml EF A4C  66.9 %             LA area A2  25.1 cm2      LA area A4  27.7 cm2 SI MOD-sp4  25.6 ml-m2           _________________________________________________________________________________ LA ESV  BP   76.7  ml LA ESV Index  A2C          LA ESV Index  A4C         LA ESV Index  BP                       32.7 ml-m2                 50.3 ml-m2                40.8 ml-m2           _________________________________________________________________________________ LA vol  87.4 ml      LA vol index  46.6 ml-m2   SV A4C  48.0 ml Doppler Measurements & Calculations MV E max vel  128.0 cm-sec   MV dec time  0.22 sec  TR max vel  370.7 cm-sec RAP systole  8.0 mmHg MV A max vel  137.3 cm-sec                          TR max PG  55.0 mmHg MV E-A  0.93                                        RVSP TR   63.0 mmHg Med Peak E  Vel  8.2 cm-sec Lat Peak E  Vel  9.2 cm-sec E-Lat E`  13.8 E-Med E`  15.7 ______________________________________________________________________________ Reading Physician                   MD Glendia Tanda Ee MD, [phone] 04-25-2024 02 07 PM  ECG 12 lead Result Date: 04/25/2024 Ventricular Rate                   76        BPM  Atrial Rate                        76        BPM                 P-R Interval                       170       ms                  QRS Duration                       84        ms                  Q-T Interval                       324       ms                  QTC Calculation Bazett             364       ms                  Calculated P Axis                  54        degrees             Calculated R Axis                  4         degrees             Calculated T Axis                  52        degrees             Sinus rhythm with intermittent atrial pacing Nonspecific T wave changes Confirmed by Launie Stanford  [phone]  on 04-25-2024 12 15 16  PM  CT Angio Chest Pulmonary Embolism Result Date: 04/25/2024 CT ANGIO CHEST PULMONARY EMBOLISM, 04/25/2024 10:27 AM INDICATION: pe COMPARISON: CT chest abdomen pelvis 01/29/2020 TECHNIQUE: Iodinated contrast was administered intravenously by rapid injection, with further multislice axial sections acquired in the pulmonary arterial phase from  the thoracic inlet to the upper abdomen. Coronal maximal intensity projection images were created for comprehensive analysis and diagnosis of the regional circulation. All CT scans at [facility] are performed using radiation dose optimization techniques as appropriate to a performed exam, including but not limited to one or more of the following: automatic exposure control, adjustment of the mA and/or kV according to patient size, use of iterative reconstruction technique. In addition, our institution participates in a radiation dose monitoring program to optimize patient radiation exposure. FINDINGS: Pulmonary arteries: No pulmonary emboli identified. Aorta/great vessels: No aneurysm. Thoracic inlet/central airways: Thyroid normal. Airway patent. Mediastinum/hila/axilla: No adenopathy. Heart: Cardiac size upper limits of normal. Moderate coronary artery calcifications.. No pericardial effusion. Left approach 2-lead pacemaker. Lungs/pleura: No pneumothorax. Bibasilar atelectasis left greater than right. Trace bilateral pleural effusions. Mild interlobular septal thickening suggesting mild interstitial edema. On bronchial wall thickening. 3 mm right middle lobe nodule is 5/107. Upper  abdomen: Unremarkable. Chest wall/MSK: Thoracic spondylosis. No aggressive or acute osseous lesions   1.  No pulmonary emboli identified. 2.  Mild interstitial edema. 3.  Trace bilateral pleural effusions. 4.  Bibasilar atelectasis. 5.  3 mm right middle lobe nodule. Pulmonary nodule recommendation: No follow-up needed if patient is low-risk. Non-contrast chest CT can be considered in 12 months if patient is high-risk (Per Fleischner Society guidelines).   XR Chest 1 View Result Date: 04/25/2024 Date of Service: 2024-04-25 03:49:00 EXAM: XR Chest, 1  View. CLINICAL HISTORY: Chest Pain. COMPARISON: April 23, 2024 FINDINGS: Lungs have a stable appearance in the interval.  No  acute appearing consolidation, pleural effusion, or suspicious parenchymal masses. Heart and mediastinal structures are stable.   Stable chest.  No acute disease. Electronically signed by: Alm Buddle, MD on 04/25/2024 06:17:13 AM US [facility]  POC Cardiac US  Result Date: 04/25/2024 [facility] Cardiac     Exam Information           Exam category   Diagnostic     Indication s  for Exam           Chest Pain     Views Obtained                   Parasternal long-axis         Parasternal short-axis         Apical 4-chamber         Subxiphoid         Inferior Vena Cava     Findings           Left Ventricle               Normal systolic function  EF >50%         Inferior Vena Cava               Normal     Interpretation           Qualitatively normal left ventricular systolic function, No significant pericardial effusion     CPT Codes           06691-73 - Limited Echocardiography  Electronically signed by Augustin Passy on Friday, April 25, 2024 at 4 30 AM  POC Thoracic US  Result Date: 04/25/2024 [facility] Thoracic     Exam Information           Exam category   Diagnostic     Indication s  for Exam           Chest Pain     Views Obtained           Scanning Protocol   BLUE         Location    Right, Left                 Anterior superior thorax         Lateral inferior thorax     Findings           Right thorax   Focal B lines         Left thorax   Focal B lines     Interpretation           Right Thorax               Alveolar interstitial syndrome  B-lines             Other - Comments   query faint b lines lateral thoracic views  Left Thorax               Alveolar interstitial syndrome  B-lines             Other - Comments   query faint b lines lateral thoracic views     CPT Codes           [id number] - Ultrasound, Chest. B-mode         76604-26 - Ultrasound, Chest. B-mode         23395-73 - Ultrasound, Chest. B-mode  Electronically signed by Augustin Passy on Friday, April 25, 2024 at 4 28 AM  XR Chest 2 Views Result  Date: 04/23/2024 Date of Service: 2024-04-23 06:00:00 EXAM: XR Chest, 2  View. CLINICAL HISTORY: for pending Discharge. COMPARISON: None provided.  FINDINGS: LUNGS AND PLEURA: Limited inspiration. Subsequent crowding of the lung markings. Mild vascular prominence and trace pleural effusions. HEART AND MEDIASTINUM: Borderline cardiomegaly for technique. Pacemaker seen with leads in good position. BONES: No acute osseous abnormality.   Limited inspiration. Subsequent crowding of the lung markings. Mild vascular prominence and trace pleural effusions. Electronically signed by: Marlee Pickett, MD on 04/23/2024 08:17:09 AM US [facility]  ECG 12 lead Result Date: 04/23/2024 Ventricular Rate                   72        BPM                 Atrial Rate                        227       BPM                 QRS Duration                       170       ms                  Q-T Interval                       474       ms                  QTC Calculation Bazett             519       ms                  Calculated R Axis                  -61       degrees             Calculated T Axis                  94        degrees             Atrial fibrillation with intermittent Ventricular Pacing Confirmed by Launie Stanford  [id number]  on 04-23-2024 7 56 43 AM  Electrophysiology procedure Result Date: 04/22/2024 Table formatting from the original result was not included. Cardiac Dual Chamber Pacemaker Placement Operative Report  Vanessa Fernandez [id number] 04/22/2024 Vanessa LELON Ferrier, MD  Surgeon: Alm Vanessa Needle, MD  Electrophysiologist Alm Needle, MD  Procedure(s): Dual Chamber Permanent Pacemaker Implantation; Cardiac Fluoroscopy  Indications: sinus node dysfunction, symptomatic bradycardia,  tachy brady  Procedure Details The risks, benefits, complications, treatment options, and expected outcomes were discussed with the patient. The patient and/or family concurred with the proposed plan, giving informed consent. Patient was prepped  and draped in the usual strict sterile fashion. After the antibiotic was completely infused, 20 cc Sensorcaine was infiltrated into the area just medial to the left deltopectoral groove.  Using a #15 scalpel, an incision was made. The incision was extended to the prepectoral fascia using electrocautery and  blunt dissection.  At the level of the pectoralis fascia, a subcutaneous pocket was formed. Using an ultrasound probe to visualize the extra-thoracic portion of the subclavian vein, a Fernandez was directed into the vein and a guidewire was directed through the Fernandez into the vein and then advanced to the heart under fluoroscopic guidance. A second guidewire was placed and advanced to the heart. A sheath was advanced over one  guidewire and the guidewire and dilator were removed. A pacing lead was advanced to the heart under fluoroscopic guidance. The sheath was peeled away. The lead was fixed to the right ventricular apical septum. Appropriate sensing and pacing thresholds were obtained. No diaphragmatic pacing occurred at 10 V and 1.5 ms.   A second sheath was advanced over the remaining guidewire. The dilator and guidewire were removed. A pacemaker lead was advanced to the heart under fluoroscopic guidance. The sheath was peeled away. The lead was fixed to the right atrial lateral wall. Multiple sites tested before finding acceptable site. Appropriate sensing and pacing thresholds were obtained. No diaphragmatic pacing occurred at 10 V and 1.5 ms.  The leads were then sutured to the pectoralis muscle using silk. A second suture was placed around the lead sleeves. Hemostasis was achieved with a purse string suture.  Lead measurements were then rechecked.  The pocket was irrigated with antibiotic solution . The leads were attached to the generator. The system was placed in the pocket. The pacemaker and leads system were visualized under fluoroscopy. Appropriate redundancy/slack in the leads were noted. The pins of  the leads were beyond the set screws.  Hemostasis was reverified. The pocket was then closed with 3 layers of Vicryl. Steri-Strips, a gauze dressing, and an operative site were placed.  Pacemaker Data:  Pacemaker generator: Medtronic W1 DR 01 Azure XT DR MRI  SN: [id number]  Right atrial lead: Medtronic [id number] SN: [id number]  Right ventricular lead: Medtronic [id number]  SN: [id number]    Measured Data:  Right atrial pacing threshold at 0.5 ms = 0 send 2.3 mA with impedance 480 ohms and P wave 1.7 mv  Right ventricular pacing threshold at 0.5 ms = 0.3 V and 0.1 mA with impedance 958 ohms and R wave 6 mV   Final Parameters:  AAI -DDD 70-110 bpm  Estimated Blood Loss:  less than 100 mL      Complications:  None; patient tolerated the procedure well.        Disposition: To a monitored bed awaiting a floor bed.        Condition: stable         XR Chest 1 View Result Date: 04/22/2024 XR CHEST 1 VIEW, 04/22/2024 2:43 PM INDICATION:s/p device implant COMPARISON: 02/23/2024 FINDINGS: Supportive devices: Left subclavian approach dual lead cardiac rhythm maintenance device with leads in place. Cardiovascular/lungs/pleura: Similar appearance of cardia mediastinal silhouette. Mild pulmonary interstitial edema. Low lung volumes. Possible bilateral small pleural effusions. No discernible pneumothorax. Other: No interval osseous changes.   1.  Left subclavian  approach dual lead cardiac rhythm maintenance device with leads in place. No discernible pneumothorax. 2.  Mild pulmonary interstitial edema with possible bilateral small pleural effusions.   ECG 12 lead Result Date: 04/18/2024 Ventricular Rate                   58        BPM                 QRS Duration                       86        ms                  Q-T Interval                       442       ms                  QTC Calculation Bazett             433       ms                  Calculated R Axis                  -1        degrees             Calculated T Axis                   2         degrees             Junctional rhythm Nonspecific ST and T wave abnormality Confirmed by Rojelio Dunnings  [id number]  on 04-18-2024 9 29 00 AM   _____________________________________________________________________________ Discharge Instructions:   Discharge Orders     Physical Therapy Home Health Coordination     Details:    Actions: Resume Home Health        Future Appointments  Date Time Provider Department Center  05/06/2024  2:40 PM Eleanor GORMAN Ashley DEVONNA [facility]  [facility]  05/08/2024  3:40 PM [facility]ARD [facility] DEVICE RN [facility] [address]  05/12/2024  9:30 AM Vanessa Lemond Ferrier, MD [facility] HP FAM [facility] [address]  07/23/2024  1:45 PM [facility] EP [facility] AFIB CLINIC [facility] [address] [facility]  12/03/2024  3:15 PM Alm Vanessa Needle, MD [facility] [address]  03/25/2025  1:00 PM Elizbeth Tobie, MD [facility] END CN None

## 2024-05-08 ENCOUNTER — Emergency Department (HOSPITAL_BASED_OUTPATIENT_CLINIC_OR_DEPARTMENT_OTHER)

## 2024-05-08 ENCOUNTER — Other Ambulatory Visit: Payer: Self-pay

## 2024-05-08 ENCOUNTER — Encounter (HOSPITAL_BASED_OUTPATIENT_CLINIC_OR_DEPARTMENT_OTHER): Payer: Self-pay | Admitting: Emergency Medicine

## 2024-05-08 ENCOUNTER — Observation Stay (HOSPITAL_BASED_OUTPATIENT_CLINIC_OR_DEPARTMENT_OTHER)
Admission: EM | Admit: 2024-05-08 | Discharge: 2024-05-14 | DRG: 193 | Disposition: A | Attending: Internal Medicine | Admitting: Internal Medicine

## 2024-05-08 DIAGNOSIS — Z79890 Hormone replacement therapy (postmenopausal): Secondary | ICD-10-CM

## 2024-05-08 DIAGNOSIS — H409 Unspecified glaucoma: Secondary | ICD-10-CM | POA: Diagnosis present

## 2024-05-08 DIAGNOSIS — I5031 Acute diastolic (congestive) heart failure: Secondary | ICD-10-CM | POA: Diagnosis present

## 2024-05-08 DIAGNOSIS — J189 Pneumonia, unspecified organism: Principal | ICD-10-CM | POA: Diagnosis present

## 2024-05-08 DIAGNOSIS — Z87891 Personal history of nicotine dependence: Secondary | ICD-10-CM

## 2024-05-08 DIAGNOSIS — Z888 Allergy status to other drugs, medicaments and biological substances status: Secondary | ICD-10-CM

## 2024-05-08 DIAGNOSIS — Z950 Presence of cardiac pacemaker: Secondary | ICD-10-CM

## 2024-05-08 DIAGNOSIS — I480 Paroxysmal atrial fibrillation: Secondary | ICD-10-CM | POA: Diagnosis present

## 2024-05-08 DIAGNOSIS — Z886 Allergy status to analgesic agent status: Secondary | ICD-10-CM

## 2024-05-08 DIAGNOSIS — Z882 Allergy status to sulfonamides status: Secondary | ICD-10-CM

## 2024-05-08 DIAGNOSIS — K5732 Diverticulitis of large intestine without perforation or abscess without bleeding: Secondary | ICD-10-CM | POA: Diagnosis present

## 2024-05-08 DIAGNOSIS — E039 Hypothyroidism, unspecified: Secondary | ICD-10-CM | POA: Diagnosis present

## 2024-05-08 DIAGNOSIS — Z7901 Long term (current) use of anticoagulants: Secondary | ICD-10-CM

## 2024-05-08 DIAGNOSIS — I5033 Acute on chronic diastolic (congestive) heart failure: Secondary | ICD-10-CM | POA: Diagnosis present

## 2024-05-08 DIAGNOSIS — Z1152 Encounter for screening for COVID-19: Secondary | ICD-10-CM

## 2024-05-08 DIAGNOSIS — J9601 Acute respiratory failure with hypoxia: Secondary | ICD-10-CM | POA: Diagnosis present

## 2024-05-08 DIAGNOSIS — E876 Hypokalemia: Secondary | ICD-10-CM | POA: Diagnosis present

## 2024-05-08 DIAGNOSIS — K219 Gastro-esophageal reflux disease without esophagitis: Secondary | ICD-10-CM | POA: Diagnosis present

## 2024-05-08 DIAGNOSIS — I358 Other nonrheumatic aortic valve disorders: Secondary | ICD-10-CM | POA: Diagnosis present

## 2024-05-08 DIAGNOSIS — Z79899 Other long term (current) drug therapy: Secondary | ICD-10-CM

## 2024-05-08 DIAGNOSIS — Z885 Allergy status to narcotic agent status: Secondary | ICD-10-CM

## 2024-05-08 LAB — CBC WITH DIFFERENTIAL/PLATELET
Abs Immature Granulocytes: 0.04 K/uL (ref 0.00–0.07)
Basophils Absolute: 0.1 K/uL (ref 0.0–0.1)
Basophils Relative: 1 %
Eosinophils Absolute: 0.2 K/uL (ref 0.0–0.5)
Eosinophils Relative: 2 %
HCT: 29.1 % — ABNORMAL LOW (ref 36.0–46.0)
Hemoglobin: 9.4 g/dL — ABNORMAL LOW (ref 12.0–15.0)
Immature Granulocytes: 1 %
Lymphocytes Relative: 20 %
Lymphs Abs: 1.7 K/uL (ref 0.7–4.0)
MCH: 29.7 pg (ref 26.0–34.0)
MCHC: 32.3 g/dL (ref 30.0–36.0)
MCV: 91.8 fL (ref 80.0–100.0)
Monocytes Absolute: 1.2 K/uL — ABNORMAL HIGH (ref 0.1–1.0)
Monocytes Relative: 14 %
Neutro Abs: 5.5 K/uL (ref 1.7–7.7)
Neutrophils Relative %: 62 %
Platelets: 345 K/uL (ref 150–400)
RBC: 3.17 MIL/uL — ABNORMAL LOW (ref 3.87–5.11)
RDW: 13.2 % (ref 11.5–15.5)
WBC: 8.7 K/uL (ref 4.0–10.5)
nRBC: 0 % (ref 0.0–0.2)

## 2024-05-08 LAB — RESP PANEL BY RT-PCR (RSV, FLU A&B, COVID)  RVPGX2
Influenza A by PCR: NEGATIVE
Influenza B by PCR: NEGATIVE
Resp Syncytial Virus by PCR: NEGATIVE
SARS Coronavirus 2 by RT PCR: NEGATIVE

## 2024-05-08 LAB — BASIC METABOLIC PANEL WITH GFR
Anion gap: 11 (ref 5–15)
BUN: 9 mg/dL (ref 8–23)
CO2: 29 mmol/L (ref 22–32)
Calcium: 8.5 mg/dL — ABNORMAL LOW (ref 8.9–10.3)
Chloride: 99 mmol/L (ref 98–111)
Creatinine, Ser: 0.82 mg/dL (ref 0.44–1.00)
GFR, Estimated: >60 mL/min (ref >60)
Glucose, Bld: 101 mg/dL — ABNORMAL HIGH (ref 70–99)
Potassium: 3.3 mmol/L — ABNORMAL LOW (ref 3.5–5.1)
Sodium: 139 mmol/L (ref 135–145)

## 2024-05-08 LAB — PRO BRAIN NATRIURETIC PEPTIDE: Pro Brain Natriuretic Peptide: 1418 pg/mL — ABNORMAL HIGH (ref <300.0)

## 2024-05-08 MED ORDER — ONDANSETRON HCL 4 MG/2ML IJ SOLN
4.0000 mg | Freq: Once | INTRAMUSCULAR | Status: AC
  Administered 2024-05-08: 4 mg via INTRAVENOUS
  Filled 2024-05-08: qty 2

## 2024-05-08 MED ORDER — FUROSEMIDE 10 MG/ML IJ SOLN
20.0000 mg | Freq: Once | INTRAMUSCULAR | Status: AC
  Administered 2024-05-08: 20 mg via INTRAVENOUS
  Filled 2024-05-08: qty 2

## 2024-05-08 MED ORDER — ACETAMINOPHEN 500 MG PO TABS
1000.0000 mg | ORAL_TABLET | Freq: Once | ORAL | Status: DC
  Filled 2024-05-08 (×2): qty 2

## 2024-05-08 NOTE — ED Triage Notes (Signed)
 Pt with dry cough and congestion x over a week.  Had pacemaker placed 2 weeks ago.  Recheck with cardiologist already and dressing removed.   I think I got sick in the hospital  has not been able to sleep d/t not being able to lie flat.

## 2024-05-08 NOTE — ED Notes (Signed)
 Unable to collect blood samples. 2x attempts. EDP aware.

## 2024-05-08 NOTE — ED Notes (Signed)
 Pt c/o back pain/cramping; sts it is d/t the Lasix; offered Tylenol , will confirm with EDP

## 2024-05-08 NOTE — ED Provider Notes (Signed)
 Emergency Department Provider Note   I have reviewed the triage vital signs and the nursing notes.   HISTORY  Chief Complaint Cough   HPI Vanessa Fernandez is a 89 y.o. female with past medical history of A-fib on apixaban  and flecainide  with recent pacemaker placement at [facility] presents to the emergency department for evaluation of persistent cough and shortness of breath.  She saw her cardiologist in office today who removed the suture after her pacemaker procedure.  She mentioned that the cough began around 2 weeks ago while she was going into the hospital for the procedure.  This persisted and staff did an x-ray prior to discharge with no acute findings.  She has been managing at home with over-the-counter cough and cold medications thinking she might have a virus.  She notes an associated dry cough and shortness of breath that is worse with lying flat.  She has not had any chest pain, fevers, swelling in the legs.  She had an office visit today with her cardiologist who removed the suture and decided to present here for further evaluation of the cough.  She states the cardiologist did not think during the visit today that her cough was related to the pacemaker.   Past Medical History:  Diagnosis Date   Atrial fibrillation (HCC)    Depression    Diverticulitis    GERD (gastroesophageal reflux disease)    Glaucoma    Thyroid disease     Review of Systems  Constitutional: No fever/chills Cardiovascular: Denies chest pain. Respiratory: Positive shortness of breath and dry cough.  Gastrointestinal: No abdominal pain.  No nausea, no vomiting.   Neurological: Negative for headaches.  ____________________________________________   PHYSICAL EXAM:  VITAL SIGNS: ED Triage Vitals  Encounter Vitals Group     BP 05/08/24 1648 (!) 160/68     Pulse Rate 05/08/24 1648 83     Resp 05/08/24 1648 18     Temp 05/08/24 1648 97.6 F (36.4 C)     Temp Source 05/08/24 1648 Oral      SpO2 05/08/24 1648 95 %   Constitutional: Alert and oriented. Well appearing and in no acute distress. Eyes: Conjunctivae are normal.  Head: Atraumatic. Nose: No congestion/rhinnorhea. Mouth/Throat: Mucous membranes are moist.   Neck: No stridor.   Cardiovascular: Normal rate, regular rhythm. Good peripheral circulation. Grossly normal heart sounds.   Respiratory: Normal respiratory effort.  No retractions. Lungs CTAB. Gastrointestinal: Soft and nontender. No distention.  Musculoskeletal: No lower extremity tenderness nor edema. No gross deformities of extremities. Neurologic:  Normal speech and language. No gross focal neurologic deficits are appreciated.  Skin:  Skin is warm, dry and intact. No rash noted. Well-appearing pacemaker site to left upper chest.    ____________________________________________   LABS (all labs ordered are listed, but only abnormal results are displayed)  Labs Reviewed  BASIC METABOLIC PANEL WITH GFR - Abnormal; Notable for the following components:      Result Value   Potassium 3.3 (*)    Glucose, Bld 101 (*)    Calcium  8.5 (*)    All other components within normal limits  PRO BRAIN NATRIURETIC PEPTIDE - Abnormal; Notable for the following components:   Pro Brain Natriuretic Peptide 1,418.0 (*)    All other components within normal limits  CBC WITH DIFFERENTIAL/PLATELET - Abnormal; Notable for the following components:   RBC 3.17 (*)    Hemoglobin 9.4 (*)    HCT 29.1 (*)    Monocytes Absolute 1.2 (*)  All other components within normal limits  RESP PANEL BY RT-PCR (RSV, FLU A&B, COVID)  RVPGX2   ____________________________________________  EKG   EKG Interpretation Date/Time:  Thursday May 08 2024 17:23:44 EST Ventricular Rate:  87 PR Interval:  195 QRS Duration:  91 QT Interval:  384 QTC Calculation: 462 R Axis:   19  Text Interpretation: Sinus rhythm Borderline T abnormalities, anterior leads Minimal ST elevation, inferior  leads Confirmed by Darra Chew [phone]) on 05/08/2024 5:41:23 PM        ____________________________________________  RADIOLOGY  DG Chest 2 View Result Date: 05/08/2024 CLINICAL DATA:  Dry cough and congestion for 1 week, recent pacemaker placement EXAM: CHEST - 2 VIEW COMPARISON:  02/09/2024 FINDINGS: Frontal and lateral views of the chest demonstrate dual lead pacer within the left anterior chest, proximal lead in the region of the right atrium and distal lead in the region of the right ventricle. Mild enlargement of the cardiac silhouette. Pulmonary vascular congestion without overt edema. There is left lower lobe consolidation along the posterior costophrenic angle, with small left pleural effusion noted. Trace right pleural effusion. No pneumothorax. No acute bony abnormalities. IMPRESSION: 1. Left lower lobe consolidation, which may reflect atelectasis or pneumonia. 2. Small bilateral pleural effusions, left greater than right. 3. Enlarged cardiac silhouette and pulmonary vascular congestion, without overt edema. 4. Dual lead pacemaker as above. Electronically Signed   By: Ozell Daring M.D.   On: 05/08/2024 18:01    ____________________________________________   PROCEDURES  Procedure(s) performed:   Procedures  CRITICAL CARE Performed by: Chew KANDICE Darra Total critical care time: 35 minutes Critical care time was exclusive of separately billable procedures and treating other patients. Critical care was necessary to treat or prevent imminent or life-threatening deterioration. Critical care was time spent personally by me on the following activities: development of treatment plan with patient and/or surrogate as well as nursing, discussions with consultants, evaluation of patient's response to treatment, examination of patient, obtaining history from patient or surrogate, ordering and performing treatments and interventions, ordering and review of laboratory studies, ordering and  review of radiographic studies, pulse oximetry and re-evaluation of patient's condition.  Chew Darra, MD Emergency Medicine  ____________________________________________   INITIAL IMPRESSION / ASSESSMENT AND PLAN / ED COURSE  Pertinent labs & imaging results that were available during my care of the patient were reviewed by me and considered in my medical decision making (see chart for details).   This patient is Presenting for Evaluation of SOB, which does require a range of treatment options, and is a complaint that involves a high risk of morbidity and mortality.  The Differential Diagnoses include viral illness, CAP, CHF, PE, etc.  Critical Interventions-    Medications  acetaminophen  (TYLENOL ) tablet 1,000 mg (0 mg Oral Hold 05/08/24 2043)  furosemide (LASIX) injection 20 mg (20 mg Intravenous Given 05/08/24 1936)  ondansetron  (ZOFRAN ) injection 4 mg (4 mg Intravenous Given 05/08/24 1945)    Reassessment after intervention: symptoms improved.    I did obtain Additional Historical Information from son at bedside.   I decided to review pertinent External Data, and in summary note from Cardiology not available at this time.   Clinical Laboratory Tests Ordered, included CBC without leukocytosis.  Hemoglobin 9.4.  No acute kidney injury.  BNP greater than 1400. COVID negative.   Radiologic Tests Ordered, included CXR. I independently interpreted the images and agree with radiology interpretation.   Cardiac Monitor Tracing which shows NSR.    Social Determinants of  Health Risk patient is a non-smoker.   Consult complete with [facility]. Plan for admit.   Medical Decision Making: Summary:  The patient presents emergency department with shortness of breath and dry cough over the past week.  Lungs are clear.  No hypoxemia.  No distress.  My leading differential at this time is viral process but given her medical comorbidities plan for screening EKG along with labs, proBNP, chest  x-ray and reassess.  Reevaluation with update and discussion with patient.  Discussed the chest x-ray findings.  My overall suspicion for pneumonia is lower.  Her son states that she just finished 2 weeks of doxycycline and was on antibiotics for diverticulitis prior to that.  She has not had any fevers or productive cough.  Favor atelectasis on chest x-ray.  Plan mainly for diuresis and admit for observation to monitor symptoms.  Patient is in agreement with this plan.  Patient's presentation is most consistent with acute presentation with potential threat to life or bodily function.   Disposition: admit  ____________________________________________  FINAL CLINICAL IMPRESSION(S) / ED DIAGNOSES  Final diagnoses:  SOB (shortness of breath)    Note:  This document was prepared using Dragon voice recognition software and may include unintentional dictation errors.  Fonda Law, MD, [facility] Emergency Medicine    Jackelyn Illingworth, Fonda MATSU, MD 05/08/24 2259

## 2024-05-08 NOTE — ED Notes (Signed)
 O2 sat noted to be 88%, pt is tachypneic at 30; placed on 2L O2

## 2024-05-08 NOTE — Plan of Care (Signed)
 Plan of Care Note for accepted transfer   Patient name: ACACIA LATORRE FMW:[id number] DOB: 01/28/1926  Facility requesting transfer: [facility] ED Requesting Provider: Dr. Darra Facility course: 89 year old female with history of A-fib with sinus pause/bradycardia on Eliquis  and flecainide  with recent pacemaker placement on 04/22/2024, hypothyroidism presented for evaluation of worsening cough and shortness of breath x 2 weeks.  Recently finished 2 week course of doxycycline. Tachypneic to 30 initially.  SpO2 88% on room air and improved with 2 L [place].  EKG showing sinus rhythm and no STEMI.  Chest x-ray showing left lower lobe consolidation, pulmonary congestion, and small bilateral pleural effusions.  No fever or leukocytosis, hemoglobin 9.4 (previously 13.3 on 12/07/2023), potassium 3.3, COVID/influenza/RSV PCR negative, proBNP 1418.  Patient was given IV Lasix 20 mg and tachypnea subsequently resolved.  Plan of care: The patient is accepted for admission to Telemetry unit at [facility].  [facility] will assume care on arrival to accepting facility. Until arrival, care as per EDP. However, [facility] available 24/7 for questions and assistance.  Check [email or web address] for on-call coverage.  Nursing staff, please call [facility] Admits & Consults System-Wide number under Amion on patient's arrival so appropriate admitting provider can evaluate the pt.

## 2024-05-08 NOTE — ED Notes (Signed)
 While administering Lasix, pt c/o sudden onset nausea. EDP notified,order received.

## 2024-05-08 NOTE — ED Notes (Signed)
 Offered patient to change into hospital gown. Patient declines at this time and will advise team if she changes her mind.

## 2024-05-09 ENCOUNTER — Other Ambulatory Visit (HOSPITAL_COMMUNITY): Payer: Self-pay

## 2024-05-09 DIAGNOSIS — R0602 Shortness of breath: Secondary | ICD-10-CM | POA: Diagnosis not present

## 2024-05-09 LAB — MAGNESIUM: Magnesium: 1.6 mg/dL — ABNORMAL LOW (ref 1.7–2.4)

## 2024-05-09 LAB — PROCALCITONIN: Procalcitonin: <0.1 ng/mL

## 2024-05-09 LAB — CREATININE, SERUM
Creatinine, Ser: 1.06 mg/dL — ABNORMAL HIGH (ref 0.44–1.00)
GFR, Estimated: 48 mL/min — ABNORMAL LOW (ref >60)

## 2024-05-09 LAB — PHOSPHORUS: Phosphorus: 3.4 mg/dL (ref 2.5–4.6)

## 2024-05-09 LAB — TSH: TSH: 11.247 u[IU]/mL — ABNORMAL HIGH (ref 0.350–4.500)

## 2024-05-09 MED ORDER — ONDANSETRON HCL 4 MG PO TABS
4.0000 mg | ORAL_TABLET | Freq: Four times a day (QID) | ORAL | Status: DC | PRN
  Filled 2024-05-09 (×4): qty 1

## 2024-05-09 MED ORDER — HEPARIN SODIUM (PORCINE) 5000 UNIT/ML IJ SOLN: 5000.0000 [IU] | Freq: Three times a day (TID) | INTRAMUSCULAR | Status: DC

## 2024-05-09 MED ORDER — IPRATROPIUM BROMIDE 0.02 % IN SOLN: 0.5000 mg | Freq: Four times a day (QID) | RESPIRATORY_TRACT | Status: DC | PRN

## 2024-05-09 MED ORDER — MAGNESIUM SULFATE 2 GM/50ML IV SOLN
2.0000 g | Freq: Once | INTRAVENOUS | Status: AC
  Administered 2024-05-09: 2 g via INTRAVENOUS
  Filled 2024-05-09: qty 50

## 2024-05-09 MED ORDER — ACETAMINOPHEN 325 MG PO TABS: 650.0000 mg | ORAL_TABLET | Freq: Four times a day (QID) | ORAL | Status: DC | PRN

## 2024-05-09 MED ORDER — FLORANEX PO PACK
1.0000 g | PACK | Freq: Three times a day (TID) | ORAL | Status: DC
  Administered 2024-05-09 – 2024-05-12 (×7): 1 g via ORAL
  Filled 2024-05-09 (×18): qty 1

## 2024-05-09 MED ORDER — DOCUSATE SODIUM 100 MG PO CAPS
100.0000 mg | ORAL_CAPSULE | Freq: Two times a day (BID) | ORAL | Status: DC
  Administered 2024-05-09 – 2024-05-11 (×3): 100 mg via ORAL
  Filled 2024-05-09 (×10): qty 1

## 2024-05-09 MED ORDER — SODIUM CHLORIDE 0.9% FLUSH
3.0000 mL | Freq: Two times a day (BID) | INTRAVENOUS | Status: DC
  Administered 2024-05-09 (×2): 3 mL via INTRAVENOUS

## 2024-05-09 MED ORDER — FAMOTIDINE 20 MG PO TABS
20.0000 mg | ORAL_TABLET | Freq: Every day | ORAL | Status: DC
  Administered 2024-05-09 – 2024-05-12 (×5): 20 mg via ORAL
  Filled 2024-05-09 (×7): qty 1

## 2024-05-09 MED ORDER — LATANOPROST 0.005 % OP SOLN
1.0000 [drp] | Freq: Every day | OPHTHALMIC | Status: DC
  Administered 2024-05-09 – 2024-05-13 (×5): 1 [drp] via OPHTHALMIC
  Filled 2024-05-09 (×3): qty 2.5

## 2024-05-09 MED ORDER — SODIUM CHLORIDE 0.9% FLUSH
3.0000 mL | Freq: Two times a day (BID) | INTRAVENOUS | Status: DC
  Administered 2024-05-09 – 2024-05-14 (×10): 3 mL via INTRAVENOUS

## 2024-05-09 MED ORDER — METOPROLOL SUCCINATE 12.5 MG HALF TABLET
12.5000 mg | ORAL_TABLET | Freq: Every day | ORAL | Status: DC
  Administered 2024-05-09 – 2024-05-10 (×2): 12.5 mg via ORAL
  Filled 2024-05-09: qty 1
  Filled 2024-05-09: qty 0.5
  Filled 2024-05-09 (×2): qty 1

## 2024-05-09 MED ORDER — HYDRALAZINE HCL 20 MG/ML IJ SOLN: 10.0000 mg | INTRAMUSCULAR | Status: DC | PRN

## 2024-05-09 MED ORDER — TRAZODONE HCL 50 MG PO TABS: 25.0000 mg | ORAL_TABLET | Freq: Every evening | ORAL | Status: DC | PRN

## 2024-05-09 MED ORDER — HYDROMORPHONE HCL 1 MG/ML IJ SOLN: 0.5000 mg | INTRAMUSCULAR | Status: DC | PRN

## 2024-05-09 MED ORDER — OXYCODONE HCL 5 MG PO TABS: 5.0000 mg | ORAL_TABLET | ORAL | Status: DC | PRN

## 2024-05-09 MED ORDER — POTASSIUM CHLORIDE CRYS ER 20 MEQ PO TBCR
40.0000 meq | EXTENDED_RELEASE_TABLET | Freq: Once | ORAL | Status: DC
  Filled 2024-05-09: qty 2

## 2024-05-09 MED ORDER — FLECAINIDE ACETATE 50 MG PO TABS
25.0000 mg | ORAL_TABLET | Freq: Every morning | ORAL | Status: DC
  Administered 2024-05-09 – 2024-05-10 (×2): 25 mg via ORAL
  Filled 2024-05-09 (×3): qty 1

## 2024-05-09 MED ORDER — FLEET ENEMA RE ENEM: 1.0000 | ENEMA | Freq: Once | RECTAL | Status: DC | PRN

## 2024-05-09 MED ORDER — FUROSEMIDE 10 MG/ML IJ SOLN
20.0000 mg | Freq: Two times a day (BID) | INTRAMUSCULAR | Status: DC
  Administered 2024-05-09 – 2024-05-10 (×3): 20 mg via INTRAVENOUS
  Filled 2024-05-09 (×3): qty 2

## 2024-05-09 MED ORDER — DOCUSATE SODIUM 100 MG PO CAPS
100.0000 mg | ORAL_CAPSULE | Freq: Two times a day (BID) | ORAL | Status: DC
  Filled 2024-05-09: qty 1

## 2024-05-09 MED ORDER — POTASSIUM CHLORIDE 10 MEQ/100ML IV SOLN
10.0000 meq | INTRAVENOUS | Status: AC
  Filled 2024-05-09 (×2): qty 100

## 2024-05-09 MED ORDER — CLORAZEPATE DIPOTASSIUM 3.75 MG PO TABS
7.5000 mg | ORAL_TABLET | Freq: Every evening | ORAL | Status: DC | PRN
  Administered 2024-05-12: 20:00:00 7.5 mg via ORAL
  Filled 2024-05-09: qty 1
  Filled 2024-05-09: qty 2

## 2024-05-09 MED ORDER — POTASSIUM CHLORIDE 20 MEQ PO PACK
40.0000 meq | PACK | Freq: Once | ORAL | Status: AC
  Administered 2024-05-09: 40 meq via ORAL
  Filled 2024-05-09: qty 2

## 2024-05-09 MED ORDER — LEVOTHYROXINE SODIUM 112 MCG PO TABS
112.0000 ug | ORAL_TABLET | Freq: Every day | ORAL | Status: DC
  Administered 2024-05-10 – 2024-05-12 (×3): 112 ug via ORAL
  Filled 2024-05-09 (×6): qty 1

## 2024-05-09 MED ORDER — ONDANSETRON HCL 4 MG/2ML IJ SOLN: 4.0000 mg | Freq: Four times a day (QID) | INTRAMUSCULAR | Status: DC | PRN

## 2024-05-09 MED ORDER — FUROSEMIDE 10 MG/ML IJ SOLN: 40.0000 mg | Freq: Two times a day (BID) | INTRAMUSCULAR | Status: DC

## 2024-05-09 MED ORDER — GUAIFENESIN 100 MG/5ML PO LIQD: 10.0000 mL | ORAL | Status: DC | PRN

## 2024-05-09 MED ORDER — LEVOTHYROXINE SODIUM 25 MCG PO TABS
112.5000 ug | ORAL_TABLET | Freq: Once | ORAL | Status: AC
  Administered 2024-05-09: 112.5 ug via ORAL

## 2024-05-09 MED ORDER — BISACODYL 5 MG PO TBEC
5.0000 mg | DELAYED_RELEASE_TABLET | Freq: Every day | ORAL | Status: DC | PRN
  Filled 2024-05-09: qty 1

## 2024-05-09 MED ORDER — FLECAINIDE ACETATE 50 MG PO TABS
50.0000 mg | ORAL_TABLET | Freq: Every day | ORAL | Status: DC
  Administered 2024-05-09 – 2024-05-10 (×3): 50 mg via ORAL
  Filled 2024-05-09 (×5): qty 1

## 2024-05-09 MED ORDER — BENZONATATE 100 MG PO CAPS
100.0000 mg | ORAL_CAPSULE | Freq: Three times a day (TID) | ORAL | Status: DC
  Administered 2024-05-09 – 2024-05-14 (×12): 100 mg via ORAL
  Filled 2024-05-09 (×18): qty 1

## 2024-05-09 MED ORDER — APIXABAN 5 MG PO TABS
5.0000 mg | ORAL_TABLET | Freq: Two times a day (BID) | ORAL | Status: DC
  Administered 2024-05-09 – 2024-05-14 (×12): 5 mg via ORAL
  Filled 2024-05-09 (×2): qty 2
  Filled 2024-05-09 (×3): qty 1
  Filled 2024-05-09: qty 2
  Filled 2024-05-09: qty 1
  Filled 2024-05-09 (×3): qty 2
  Filled 2024-05-09 (×3): qty 1
  Filled 2024-05-09: qty 2
  Filled 2024-05-09: qty 1
  Filled 2024-05-09: qty 2

## 2024-05-09 MED ORDER — FUROSEMIDE 10 MG/ML IJ SOLN
20.0000 mg | Freq: Once | INTRAMUSCULAR | Status: AC
  Administered 2024-05-09: 20 mg via INTRAVENOUS
  Filled 2024-05-09: qty 2

## 2024-05-09 MED ORDER — LEVOFLOXACIN 500 MG PO TABS
250.0000 mg | ORAL_TABLET | Freq: Every day | ORAL | Status: DC
  Administered 2024-05-09 – 2024-05-10 (×2): 250 mg via ORAL
  Filled 2024-05-09 (×2): qty 1

## 2024-05-09 MED ORDER — PANTOPRAZOLE SODIUM 40 MG PO TBEC
40.0000 mg | DELAYED_RELEASE_TABLET | Freq: Every day | ORAL | Status: DC
  Administered 2024-05-09 – 2024-05-10 (×2): 40 mg via ORAL
  Filled 2024-05-09 (×4): qty 1

## 2024-05-09 MED ORDER — SENNOSIDES-DOCUSATE SODIUM 8.6-50 MG PO TABS
1.0000 | ORAL_TABLET | Freq: Every evening | ORAL | Status: DC | PRN
  Filled 2024-05-09: qty 1

## 2024-05-09 NOTE — Assessment & Plan Note (Addendum)
 Home famotidine  20 mg BID continued

## 2024-05-09 NOTE — Evaluation (Signed)
 Occupational Therapy Evaluation Patient Details Name: Vanessa Fernandez MRN: [id number] DOB: 06/17/1925 Today's Date: 05/09/2024   History of Present Illness   89 y.o. female admitted 05/08/24 with 2-wk h/o SOB and cough. Workup for suspected CHF. Of note, recent PPM placement 04/22/24. Other PMH includes afib with sinus pause/bradycardia on Eliquis , GERD.     Clinical Impressions Pt admitted based on above, and was seen based on problem list below. PTA pt was independent with ADLs and IADLs. Today pt is requiring set up  to CGA for ADLs. Functional transfers are  CGA with RW. Pt limited by decreased activity tolerance and strength. Anticipate pt will progress well, no follow up OT needs. OT will continue to follow acutely to maximize functional independence.     If plan is discharge home, recommend the following:   A little help with walking and/or transfers;A little help with bathing/dressing/bathroom;Assistance with cooking/housework     Functional Status Assessment   Patient has had a recent decline in their functional status and demonstrates the ability to make significant improvements in function in a reasonable and predictable amount of time.     Equipment Recommendations   None recommended by OT      Precautions/Restrictions   Precautions Precautions: Fall;ICD/Pacemaker Recall of Precautions/Restrictions: Intact Restrictions Other Position/Activity Restrictions: PPM     Mobility Bed Mobility Overal bed mobility: Modified Independent     General bed mobility comments: Use of bed rail    Transfers Overall transfer level: Needs assistance Equipment used: None Transfers: Sit to/from Stand Sit to Stand: Contact guard assist     General transfer comment: CGA with use of IV pole for mobility      Balance Overall balance assessment: Needs assistance Sitting-balance support: No upper extremity supported Sitting balance-Leahy Scale: Good     Standing  balance support: No upper extremity supported, During functional activity Standing balance-Leahy Scale: Fair           ADL either performed or assessed with clinical judgement   ADL Overall ADL's : Needs assistance/impaired Eating/Feeding: Set up;Sitting   Grooming: Contact guard assist;Standing           Upper Body Dressing : Set up;Sitting   Lower Body Dressing: Contact guard assist;Sit to/from stand Lower Body Dressing Details (indicate cue type and reason): Able to figure 4 legs Toilet Transfer: Contact guard Actor;Ambulation;Grab bars Toilet Transfer Details (indicate cue type and reason): use of IV pole Toileting- Clothing Manipulation and Hygiene: Sitting/lateral lean;Contact guard assist       Functional mobility during ADLs: Contact guard assist General ADL Comments: Limited by decreased activity tolerance     Vision Baseline Vision/History: 0 No visual deficits Patient Visual Report: No change from baseline Vision Assessment?: No apparent visual deficits            Pertinent Vitals/Pain Pain Assessment Pain Assessment: Faces Faces Pain Scale: Hurts little more Pain Location: groin Pain Descriptors / Indicators: Discomfort Pain Intervention(s): Monitored during session     Extremity/Trunk Assessment Upper Extremity Assessment Upper Extremity Assessment: Generalized weakness   Lower Extremity Assessment Lower Extremity Assessment: Defer to PT evaluation   Cervical / Trunk Assessment Cervical / Trunk Assessment: Kyphotic   Communication Communication Communication: Impaired Factors Affecting Communication: Hearing impaired   Cognition Arousal: Alert Behavior During Therapy: WFL for tasks assessed/performed Cognition: No apparent impairments       Following commands: Intact       Cueing  General Comments   Cueing Techniques: Verbal cues  VSS on RA           Home Living Family/patient expects to be discharged to::  Private residence Living Arrangements: Alone Available Help at Discharge: Family;Available PRN/intermittently Type of Home: House Home Access: Stairs to enter [employer] of Steps: 2 Entrance Stairs-Rails: Right;Left;Can reach both Home Layout: Multi-level;Able to live on main level with bedroom/bathroom     Bathroom Shower/Tub: Producer, Television/film/video: Handicapped height Bathroom Accessibility: Yes   Home Equipment: Agricultural Consultant (2 wheels);Cane - single point;Grab bars - tub/shower;Hand held shower head   Additional Comments: son is retired, lives nearby and able to assist as needed      Prior Functioning/Environment Prior Level of Function : Independent/Modified Independent             Mobility Comments: typically indep without DME inside, uses SPC outside; working with HHPT since recent hospital admissions ADLs Comments: mod indep ADL/iADLs    OT Problem List: Decreased strength;Decreased activity tolerance;Impaired balance (sitting and/or standing);Cardiopulmonary status limiting activity   OT Treatment/Interventions: Self-care/ADL training;Therapeutic exercise;Energy conservation;DME and/or AE instruction;Therapeutic activities;Patient/family education;Balance training      OT Goals(Current goals can be found in the care plan section)   Acute Rehab OT Goals Patient Stated Goal: To go home OT Goal Formulation: With patient Time For Goal Achievement: 05/23/24 Potential to Achieve Goals: Good ADL Goals Pt Will Perform Grooming: with modified independence;standing Pt Will Perform Lower Body Dressing: with modified independence;sit to/from stand Pt Will Transfer to Toilet: with modified independence;ambulating;regular height toilet Pt Will Perform Toileting - Clothing Manipulation and hygiene: with modified independence;sit to/from stand Additional ADL Goal #1: Pt will verbalize at least 3 energy conservation strategies for ADLs   OT  Frequency:  Min 2X/week       AM-PAC OT 6 Clicks Daily Activity     Outcome Measure Help from another person eating meals?: None Help from another person taking care of personal grooming?: A Little Help from another person toileting, which includes using toliet, bedpan, or urinal?: A Little Help from another person bathing (including washing, rinsing, drying)?: A Little Help from another person to put on and taking off regular upper body clothing?: A Little Help from another person to put on and taking off regular lower body clothing?: A Little 6 Click Score: 19   End of Session Nurse Communication: Mobility status  Activity Tolerance: Patient tolerated treatment well Patient left: in bed;with call bell/phone within reach;with bed alarm set  OT Visit Diagnosis: Unsteadiness on feet (R26.81);Other abnormalities of gait and mobility (R26.89);Muscle weakness (generalized) (M62.81)                Time: 8458-8441 OT Time Calculation (min): 17 min Charges:  OT General Charges $OT Visit: 1 Visit OT Evaluation $OT Eval Moderate Complexity: 1 Mod  Vanessa Fernandez, OT  Acute Rehabilitation Services Office [phone] Secure chat preferred   Vanessa Fernandez Savers 05/09/2024, 4:11 PM

## 2024-05-09 NOTE — Progress Notes (Signed)
 RN was notified of Pt O2 saturation falling. RN witnessed pt sleeping, placed pt back on 3L O2 while sleeping.

## 2024-05-09 NOTE — Assessment & Plan Note (Signed)
 Recent history of diverticulitis, denies any abdominal pain -S/p recent treatment with doxycycline

## 2024-05-09 NOTE — Assessment & Plan Note (Addendum)
 Follows with Cardiology at [facility]. History of A-fib, with pauses, status post dual-chamber pacemaker placementon 04/22/24-sutures were removed yesterday (05/08/2024)  05/10/2024 currently in NSR on telemetry.   12/14 -- this afternoon, pt had some increased palpitations, onset after walking across her home, resolved after resting. HR was 99-110 but improved. HR overall has been controlled.  12/15 -- pt reports A-fib symptoms better this morning.   --PO Lopressor  12.5 mg BID PRN if HR sustaining > 110 bpm --EKG PRN if HR sustaining > 110 bpm --Continue home Eliquis  and flecainide  12/15 PM visit HR in 120-130's at rest with O2 90% on room air.  12/16 -- will start low dose scheduled metoprolol  12.5 mg BID (with BP hold parameters) --Message sent to patient's EP at [facility] Dr. Epifanio to update him at pt's request  She just had PPM placed last month at [facility]. She can f/u with her cardiologist at [facility] after discharge.  12/17: new Rx, metoprolol  tartrate 12.5 mg BID. Continue home flecanide 50 mg PO BID, Eliquis  5 mg PO BID.

## 2024-05-09 NOTE — Evaluation (Signed)
 Physical Therapy Evaluation Patient Details Name: Vanessa Fernandez MRN: [id number] DOB: 10-02-1925 Today's Date: 05/09/2024  History of Present Illness  89 y.o. female admitted 05/08/24 with 2-wk h/o SOB and cough. Workup for suspected CHF. Of note, recent PPM placement 04/22/24. Other PMH includes afib with sinus pause/bradycardia on Eliquis , GERD.  Clinical Impression  Pt presents with an overall decrease in functional mobility secondary to above. PTA, pt mod indep with SPC, lives alone with son nearby who can assist as needed. Today, pt able to transfer and ambulate in room with CGA for balance, demonstrates good awareness of pacemaker precautions; notable DOE and nonproductive cough, though pt reports breathing significantly improved since admission. Pt would benefit from continued acute PT services to maximize functional mobility and independence prior to d/c with continued HHPT services.     SpO2 96% on RA at rest SpO2 94% on RA with activity     If plan is discharge home, recommend the following: A little help with bathing/dressing/bathroom;Assistance with cooking/housework;Assist for transportation;Help with stairs or ramp for entrance   Can travel by private vehicle    Yes    Equipment Recommendations None recommended by PT  Recommendations for Other Services   Mobility Specialist   Functional Status Assessment Patient has had a recent decline in their functional status and demonstrates the ability to make significant improvements in function in a reasonable and predictable amount of time.     Precautions / Restrictions Precautions Precautions: Fall;ICD/Pacemaker Recall of Precautions/Restrictions: Intact Precaution/Restrictions Comments: good awareness of pacemaker precautions Restrictions Weight Bearing Restrictions Per Provider Order: Yes Other Position/Activity Restrictions: LUE limited pushing/pulling s/p pacemaker      Mobility  Bed Mobility Overal bed  mobility: Modified Independent             General bed mobility comments: use of bed rail with RUE, HOB elevated, increased effort    Transfers Overall transfer level: Needs assistance Equipment used: None Transfers: Sit to/from Stand Sit to Stand: Contact guard assist           General transfer comment: able to stand on third attempt without DME, CGA for balance/lines; supervision to stand from low toilet with use of grab bar    Ambulation/Gait Ambulation/Gait assistance: Contact guard assist Gait Distance (Feet): 36 Feet Assistive device: IV Pole Gait Pattern/deviations: Step-through pattern, Decreased stride length, Trunk flexed Gait velocity: Decreased     General Gait Details: slow, fatigued gait with UE support on IV pole, CGA for safety/lines; notable DOE though pt reports breathing improved since admission  Stairs            Wheelchair Mobility     Tilt Bed    Modified Rankin (Stroke Patients Only)       Balance Overall balance assessment: Needs assistance Sitting-balance support: No upper extremity supported Sitting balance-Leahy Scale: Good Sitting balance - Comments: indep to don bilateral socks, indep for toileting/pericare   Standing balance support: No upper extremity supported, During functional activity Standing balance-Leahy Scale: Fair Standing balance comment: can stand, take steps and wash hands without UE support, unable to accept significant challenge; static and dynamic stability improved with UE support                             Pertinent Vitals/Pain Pain Assessment Pain Assessment: Faces Faces Pain Scale: Hurts little more Pain Location: groin raw from purewick Pain Descriptors / Indicators: Discomfort Pain Intervention(s): Monitored during session, Other (comment) (  purewick removed, RN notified)    Home Living Family/patient expects to be discharged to:: Private residence Living Arrangements:  Alone Available Help at Discharge: Family;Available PRN/intermittently Type of Home: House Home Access: Stairs to enter Entrance Stairs-Rails: Right;Left;Can reach both Entrance Stairs-Number of Steps: 2   Home Layout: Multi-level;Able to live on main level with bedroom/bathroom Home Equipment: Rolling Walker (2 wheels);Cane - single point;Grab bars - tub/shower;Hand held shower head Additional Comments: son is retired, lives nearby and able to assist as needed    Prior Function Prior Level of Function : Independent/Modified Independent             Mobility Comments: typically indep without DME inside, uses SPC outside; working with HHPT since recent hospital admissions ADLs Comments: mod indep ADL/iADLs     Extremity/Trunk Assessment   Upper Extremity Assessment Upper Extremity Assessment: Generalized weakness    Lower Extremity Assessment Lower Extremity Assessment: Generalized weakness    Cervical / Trunk Assessment Cervical / Trunk Assessment: Kyphotic  Communication   Communication Communication: Impaired Factors Affecting Communication: Hearing impaired    Cognition Arousal: Alert Behavior During Therapy: WFL for tasks assessed/performed   PT - Cognitive impairments: No apparent impairments                         Following commands: Intact       Cueing Cueing Techniques: Verbal cues     General Comments General comments (skin integrity, edema, etc.): SpO2 down to 84% on RA with sitting EOB activity, replaced 2L O2 [place] to recover; SpO2 >/94% on RA ambulating to/from bathroom, maintaining 96% on RA with seated rest; HR 90s-100s. educ pt re: role of acute PT, POC, activity recommendations, DME use, fall risk reduction, potential d/c needs    Exercises     Assessment/Plan    PT Assessment Patient needs continued PT services  PT Problem List Decreased strength;Decreased activity tolerance;Decreased balance;Decreased mobility;Decreased knowledge  of use of DME;Cardiopulmonary status limiting activity       PT Treatment Interventions DME instruction;Gait training;Stair training;Functional mobility training;Therapeutic activities;Therapeutic exercise;Balance training;Patient/family education    PT Goals (Current goals can be found in the Care Plan section)  Acute Rehab PT Goals Patient Stated Goal: return home asap PT Goal Formulation: With patient Time For Goal Achievement: 05/23/24 Potential to Achieve Goals: Good    Frequency Min 2X/week     Co-evaluation               AM-PAC PT 6 Clicks Mobility  Outcome Measure Help needed turning from your back to your side while in a flat bed without using bedrails?: None Help needed moving from lying on your back to sitting on the side of a flat bed without using bedrails?: A Little Help needed moving to and from a bed to a chair (including a wheelchair)?: A Little Help needed standing up from a chair using your arms (e.g., wheelchair or bedside chair)?: A Little Help needed to walk in hospital room?: A Little Help needed climbing 3-5 steps with a railing? : A Little 6 Click Score: 19    End of Session Equipment Utilized During Treatment: Gait belt Activity Tolerance: Patient tolerated treatment well Patient left: with call bell/phone within reach;in bed;with bed alarm set Nurse Communication: Mobility status;Other (comment) (purewick removed due to c/o skin irritation) PT Visit Diagnosis: Other abnormalities of gait and mobility (R26.89);Muscle weakness (generalized) (M62.81)    Time: 8548-8472 PT Time Calculation (min) (ACUTE ONLY): 36 min  Charges:   PT Evaluation $PT Eval Moderate Complexity: 1 Mod PT Treatments $Therapeutic Activity: 8-22 mins PT General Charges $$ ACUTE PT VISIT: 1 Visit       Darice Almas, PT, DPT Acute Rehabilitation Services  Personal: Secure Chat Rehab Office: [phone]  Darice LITTIE Almas 05/09/2024, 4:04 PM

## 2024-05-09 NOTE — Hospital Course (Addendum)
 Ms. Kayra Crowell is a 89 year old female with A-fib with sinus pause/bradycardia on Eliquis  and flecainide  with recent pacemaker placement on 04/22/2024 at [facility], hypothyroidism.  12/11: presented to [facility] ED for shortness of breath, cough x 2 weeks.. Recently completed 2 weeks of antibiotics, doxycycline. Denied of having any fever or chills.   Admission Labs: Covid,rsv, flu negative WBC 8.7, HgB 9.4, plt 345 Pro-BNP 1418 Na 139, K 3.3, CO2 of 29, BUN 9, Scr 0.82, glu 101 Mg 1.6 TSH 11.247 Procalcitonin <0.10  12/12: Pt admitted to hospitalist service for possible community-acquired pneumonia and acute heart failure exacerbation.  Patient was initiated on empiric antibiotic and gentle diuresis with IV furosemide .  12/13: Patient admitted to hospital at home program.  CT chest showed left lower lobe pneumonia ceftriaxone  and p.o. azithromycin . Levaquin  was discontinued.  Patient   Significant Events: Admitted 05/08/2024 for SOB, acute diastolic CHF 05-10-2024 transferred to [facility] program 12/14-15 -intermittently hypoxic, continues to require O2. A-fib with RVR intermittently. 12/16 -low-dose metoprolol  tartrate 12.5 mg BID added  12/16: I assumed care of the patient.  Overnight no reports.  Patient heart rate maintained as appropriate on new metoprolol  tartrate 12.5 mg p.o. twice daily. Plan for discharge today with augmentin  875/125 BID to complete 10 day course.

## 2024-05-09 NOTE — H&P (Signed)
 History and Physical   [name]                            PCP: Vanessa Norleen ORN., MD                    DOB: 04-13-1926            DOA: 05/08/2024 FMW:[id number]             DOS: 05/09/2024, 10:14 AM  Vanessa Norleen ORN., MD  Patient coming from:   HOME  I have personally reviewed patient's medical records, in electronic medical records, including:  [facility], and care everywhere.    Chief Complaint:   Chief Complaint  Patient presents with   Cough    History of present illness:    Vanessa Fernandez is a 89 year old female with history of A-fib with sinus pause/bradycardia on Eliquis  and flecainide  with recent pacemaker placement on 04/22/2024 at [facility], hypothyroidism presented to [facility] ED for chief complaint shortness of breath, cough x 2 weeks.. Recently completed 2 weeks of antibiotics, doxycycline. Denied of having any fever or chills.   ED Evaluation: POA: Tachypneic to 30 initially.  SpO2 88% on room air and improved with 2 L [place].    Blood pressure 133/63, pulse 83, temperature 98.2 F (36.8 C), temperature source Oral, RR (!) 22, height 5' 3 (1.6 m), weight 79.4 kg, SpO2 100% 2 L.    EKG showing sinus rhythm and no STEMI.   Chest x-ray showing left lower lobe consolidation, pulmonary congestion, and small bilateral pleural effusions.   WBC: , hemoglobin 9.4 (previously 13.3 on 12/07/2023), potassium 3.3, C OVID/influenza/RSV PCR negative,  proBNP 1418.   Received IV Lasix 20 mg in ED,  tachypnea has improved  Differential Diagnoses: include viral illness, ?  Bronchitis/CAP, CHF.Vanessa Fernandez PE less likely as any chest pain, has been compliant with her Eliquis    Patient Denies having: Fever, Chills, Cough, SOB, Chest Pain, Abd pain, N/V/D, headache, dizziness, lightheadedness,  Dysuria, Joint pain, rash, open wounds     Review of Systems: As per HPI, otherwise 10 point review of systems were negative.    ----------------------------------------------------------------------------------------------------------------------  Allergies[1]  Home MEDs:  Prior to Admission medications  Medication Sig Start Date End Date Taking? Authorizing Provider  apixaban  (ELIQUIS ) 5 MG TABS tablet Take 2.5 mg by mouth 2 (two) times daily. 07/21/19  Yes [provider]  Cholecalciferol (VITAMIN D3) 50 MCG (2000 UT) capsule Take 2,000 Units by mouth daily.   Yes [provider]  Cyanocobalamin  (VITAMIN B-12 PO) Take 1 tablet by mouth daily.   Yes [provider]  docusate sodium  (COLACE) 100 MG capsule Take 1 capsule (100 mg total) by mouth every 12 (twelve) hours. 12/12/22  Yes Randol Simmonds, MD  famotidine  (PEPCID ) 20 MG tablet Take 20 mg by mouth daily.   Yes [provider]  flecainide  (TAMBOCOR ) 50 MG tablet Take 25-50 mg by mouth See admin instructions. 25 mg in the morning, 50 mg in the evening.   Yes [provider]  latanoprost  (XALATAN ) 0.005 % ophthalmic solution Place 1 drop into both eyes at bedtime. 05/30/19  Yes [provider]  levothyroxine  (SYNTHROID ) 112 MCG tablet Take 112 mcg by mouth at bedtime. 12/29/19  Yes [provider]  magnesium oxide (MAG-OX) 400 MG tablet Take 400 mg by mouth daily.   Yes [provider]  Multiple Vitamin (  MULTI-VITAMIN) tablet Take 1 tablet by mouth daily.   Yes [provider]  acetaminophen  (TYLENOL ) 500 MG tablet Take 1,000 mg by mouth every 6 (six) hours as needed. For pain     [provider]  clorazepate  (TRANXENE ) 7.5 MG tablet Take 7.5 mg by mouth at bedtime as needed for sleep.    [provider]  cromolyn (NASALCROM) 5.2 MG/ACT nasal spray Place 1 spray into the nose daily as needed (nasal congestion).    [provider]  guaiFENesin -dextromethorphan (ROBITUSSIN DM) 100-10 MG/5ML syrup Take 5 mLs by mouth 3 (three) times daily as needed for cough. 05/24/22    Patsey Lot, MD  lidocaine  (LIDODERM ) 5 % Place 2 patches onto the skin daily. Remove & Discard patch within 12 hours or as directed by MD 12/07/23   Zackowski, Scott, MD  ondansetron  (ZOFRAN -ODT) 8 MG disintegrating tablet Take 1 tablet (8 mg total) by mouth every 8 (eight) hours as needed for nausea or vomiting. 12/12/22   Randol Simmonds, MD  pantoprazole  (PROTONIX ) 40 MG tablet Take 1 tablet (40 mg total) by mouth daily. 12/12/22 01/11/23  Randol Simmonds, MD  sucralfate  (CARAFATE ) 1 g tablet Take 1 tablet (1 g total) by mouth 3 (three) times daily. 12/12/22   Randol Simmonds, MD    PRN MEDs: bisacodyl , clorazepate , hydrALAZINE, HYDROmorphone  (DILAUDID ) injection, ipratropium, ondansetron  **OR** ondansetron  (ZOFRAN ) IV, oxyCODONE , senna-docusate, sodium phosphate, traZODone  Past Medical History:  Diagnosis Date   Atrial fibrillation (HCC)    Depression    Diverticulitis    GERD (gastroesophageal reflux disease)    Glaucoma    Thyroid disease     Past Surgical History:  Procedure Laterality Date   JOINT REPLACEMENT       reports that she has never smoked. She does not have any smokeless tobacco history on file. She reports that she does not drink alcohol and does not use drugs.   History reviewed. No pertinent family history.  Physical Exam:   Vitals:   05/09/24 0730 05/09/24 0738 05/09/24 0740 05/09/24 0755  BP:    133/63  Pulse: 81  84 83  Resp:      Temp:  98.2 F (36.8 C)    TempSrc:  Oral    SpO2: 100%  (S) (!) 83% 100%  Weight:      Height:       Constitutional: NAD, calm, comfortable Eyes: PERRL, lids and conjunctivae normal ENMT: Mucous membranes are moist. Posterior pharynx clear of any exudate or lesions.Normal dentition.  Neck: normal, supple, no masses, no thyromegaly Respiratory: clear to auscultation bilaterally, no wheezing, no crackles. Normal respiratory effort. No accessory muscle use.  Cardiovascular: Regular rate and rhythm, no murmurs / rubs / gallops. No  extremity edema. 2+ pedal pulses. No carotid bruits.  Abdomen: no tenderness, no masses palpated. No hepatosplenomegaly. Bowel sounds positive.  Musculoskeletal: no clubbing / cyanosis. No joint deformity upper and lower extremities. Good ROM, no contractures. Normal muscle tone.  Neurologic: CN II-XII grossly intact. Sensation intact, DTR normal. Strength 5/5 in all 4.  Psychiatric: Normal judgment and insight. Alert and oriented x 3. Normal mood.  Skin: no rashes, lesions, ulcers. No induration Decubitus/ulcers:  Wounds: per nursing documentation         Labs on admission:    I have personally reviewed following labs and imaging studies  CBC: Recent Labs  Lab 05/08/24 1800  WBC 8.7  NEUTROABS 5.5  HGB 9.4*  HCT 29.1*  MCV 91.8  PLT 345  Basic Metabolic Panel: Recent Labs  Lab 05/08/24 1800  NA 139  K 3.3*  CL 99  CO2 29  GLUCOSE 101*  BUN 9  CREATININE 0.82  CALCIUM  8.5*    BNP (last 3 results) Recent Labs    05/08/24 1800  PROBNP 1,418.0*    Urine analysis:    Component Value Date/Time   COLORURINE YELLOW 12/07/2023 1547   APPEARANCEUR CLEAR 12/07/2023 1547   LABSPEC 1.020 12/07/2023 1547   PHURINE 5.5 12/07/2023 1547   GLUCOSEU NEGATIVE 12/07/2023 1547   HGBUR NEGATIVE 12/07/2023 1547   BILIRUBINUR NEGATIVE 12/07/2023 1547   KETONESUR NEGATIVE 12/07/2023 1547   PROTEINUR NEGATIVE 12/07/2023 1547   UROBILINOGEN 0.2 04/04/2011 1942   NITRITE NEGATIVE 12/07/2023 1547   LEUKOCYTESUR NEGATIVE 12/07/2023 1547    Last A1C:  Lab Results  Component Value Date   HGBA1C 5.5 03/28/2022     Radiologic Exams on Admission:   DG Chest 2 View Result Date: 05/08/2024 CLINICAL DATA:  Dry cough and congestion for 1 week, recent pacemaker placement EXAM: CHEST - 2 VIEW COMPARISON:  02/09/2024 FINDINGS: Frontal and lateral views of the chest demonstrate dual lead pacer within the left anterior chest, proximal lead in the region of the right atrium and  distal lead in the region of the right ventricle. Mild enlargement of the cardiac silhouette. Pulmonary vascular congestion without overt edema. There is left lower lobe consolidation along the posterior costophrenic angle, with small left pleural effusion noted. Trace right pleural effusion. No pneumothorax. No acute bony abnormalities. IMPRESSION: 1. Left lower lobe consolidation, which may reflect atelectasis or pneumonia. 2. Small bilateral pleural effusions, left greater than right. 3. Enlarged cardiac silhouette and pulmonary vascular congestion, without overt edema. 4. Dual lead pacemaker as above. Electronically Signed   By: Ozell Daring M.D.   On: 05/08/2024 18:01    EKG:   Independently reviewed.  Orders placed or performed during the hospital encounter of 05/08/24   ED EKG   ED EKG   EKG 12-Lead   EKG 12-Lead   EKG   EKG   EKG 12-Lead   ---------------------------------------------------------------------------------------------------------------------------------------    Assessment / Plan:   Principal Problem:   SOB (shortness of breath) Active Problems:   A-fib (HCC)   Acute CHF (congestive heart failure) (HCC)   Diverticulitis   GERD (gastroesophageal reflux disease)   Hypothyroidism   Assessment and Plan: * SOB (shortness of breath) - Shortness of breath, dry cough, tachypnea -Elevated BNP <1400 chest x-ray consistent with some congestion, possible in lower lobes -Cannot rule out viral pneumonia, patient remained afebrile, mildly hypoxic with no leukocytosis -Influenza A/B SARS-CoV-2-all negative -Due to comorbidities, initiating empiric antibiotics PO Levaquin for few days  Will Obtaining procalcitonin level, will de-escalate antibiotics accordingly - Continuing gentle diuresis with IV Lasix   Acute CHF (congestive heart failure) (HCC) - It appears she may have HFpEF - Elevated proBNP 1418.0, with some congestions present on imaging -With underlying  history of A-fib, falls, status post pacemaker placement -Continuing IV diuretics Lasix -Monitoring I's and O's, daily weight  -Echo from outside facility from  12-20- 25,; reviewed EF 60-65%, mild LVH, mild MR, mild TR, aortic valve calcification, trivial pericardial effusion    A-fib (HCC) History of A-fib, with pauses, status post dual-chamber pacemaker placement in [facility] on 04/22/24-sutures were removed yesterday 05/08/2024 -Rhythm is well-controlled -Continue home medication of Eliquis , flecainide ,  Hypothyroidism Continue Synthroid  - will Check TSH  GERD (gastroesophageal reflux disease) Continue PPI  Diverticulitis  Recent history of diverticulitis, denies any abdominal pain -S/p recent treatment with doxycycline     Consults called:  NONE  -------------------------------------------------------------------------------------------------------------------------------------------- DVT prophylaxis:  SCDs Start: 05/09/24 0943 apixaban  (ELIQUIS ) tablet 5 mg Start: 05/09/24 0100 apixaban  (ELIQUIS ) tablet 5 mg   Code Status:   Code Status: Full Code   Admission status: Patient will be admitted as Observation, with a greater than 2 midnight length of stay. Level of care: Telemetry   Family Communication:  none at bedside  (The above findings and plan of care has been discussed with patient in detail, the patient expressed understanding and agreement of above plan)  --------------------------------------------------------------------------------------------------------------------------------------------------  Disposition Plan:  Anticipated 1-2 days Status is: Observation The patient remains OBS appropriate and will d/c before 2 midnights.     ----------------------------------------------------------------------------------------------------------------------------------------------------  Time spent:  27  Min.  Was spent seeing and evaluating the patient,  reviewing all medical records, drawn plan of care.  SIGNED: Adriana DELENA Grams, MD, FHM. FAAFP. [place] [facility], Pager  (Please use [email or web address] to page/ or secure chat through epic) If 7PM-7AM, please contact night-coverage [email or web address],  05/09/2024, 10:14 AM     [1]  Allergies Allergen Reactions   Fentanyl  Shortness Of Breath   Asa [Aspirin] Other (See Comments)    GI upset   Bextra [Valdecoxib] Other (See Comments)    GI upset    Celebrex [Celecoxib] Other (See Comments)    GI upset    Codeine Nausea And Vomiting   Motrin [Ibuprofen] Other (See Comments)    GI upset   Naprosyn [Naproxen] Other (See Comments)    GI upset    Sulfa Antibiotics Rash    hands blister

## 2024-05-09 NOTE — Assessment & Plan Note (Addendum)
 05/09/2024 Continue Synthroid  --Check TSH 05/10/2024 TSH elevated at 11.247. add-on FT4 to today's labs. Continue synthroid  112 mcg for now. 12/14 - free T4 mildly elevated 1.20 (ref range 0.61--1.12).   --Given her reported increase A-fib symptoms, will reduce synthroid  slightly to 100 mcg --Recommend repeat thyroid studies with PCP in 6/-8 weeks

## 2024-05-09 NOTE — Assessment & Plan Note (Addendum)
 05/09/2024 - It appears she may have HFpEF.   - Elevated proBNP 1418.0, with some congestions present on imaging -With underlying history of A-fib, falls, status post pacemaker placement -Continuing IV diuretics Lasix  -Monitoring I's and O's, daily weight  Echo from [facility] 12-20- 25,; reviewed EF 60-65%, mild LVH, mild MR, mild TR, aortic valve calcification, trivial pericardial effusion  05/10/2024 urine output about 1.5 liters yesterday. Pt feeling better. Pt had Pro-BNP on admission that was elevated and BNP today that was normal. Will check bnp tomorrow just so we can compare same lab tests. Given her rise of serum CO2, Bun and Scr, will change IV lasix  to 20 mg daily(next dose tomorrow). She did get dose of IV lasix  20 mg daily.  05/11/24 -- Cr up a bit more today 1.26. --Stop further diuresis for now. DC Lasix .  12/15>>16/25 - Off diuresis & renal function improving 12/17: reviewed sCr/eGFR from 12/16, showing 1.04/49, respectively. This may be patient's new baseline. Follow-up with PCP for ongoing monitor.

## 2024-05-09 NOTE — Assessment & Plan Note (Addendum)
 05/09/2024 - Shortness of breath, dry cough, tachypnea -Elevated BNP <1400 chest x-ray consistent with some congestion, possible in lower lobes -Cannot rule out viral pneumonia, patient remained afebrile, mildly hypoxic with no leukocytosis -Influenza A/B SARS-CoV-2-all negative -Due to comorbidities, initiating empiric antibiotics PO Levaquin  for few days  Will Obtaining procalcitonin level, will de-escalate antibiotics accordingly - Continuing gentle diuresis with IV Lasix   05/10/2024 procalcitonin negative. Will get CT chest without contrast just to make sure she does not have a occult pneumonia but I doubt it. Stop levaquin  for now.  **update.  CT chest shows LLL pneumonia. Start IV rocephin /po zithromax .

## 2024-05-10 ENCOUNTER — Inpatient Hospital Stay (HOSPITAL_COMMUNITY)

## 2024-05-10 DIAGNOSIS — Z888 Allergy status to other drugs, medicaments and biological substances status: Secondary | ICD-10-CM | POA: Diagnosis not present

## 2024-05-10 DIAGNOSIS — J9601 Acute respiratory failure with hypoxia: Secondary | ICD-10-CM | POA: Diagnosis present

## 2024-05-10 DIAGNOSIS — Z950 Presence of cardiac pacemaker: Secondary | ICD-10-CM | POA: Diagnosis not present

## 2024-05-10 DIAGNOSIS — Z1152 Encounter for screening for COVID-19: Secondary | ICD-10-CM | POA: Diagnosis not present

## 2024-05-10 DIAGNOSIS — I5033 Acute on chronic diastolic (congestive) heart failure: Secondary | ICD-10-CM | POA: Diagnosis present

## 2024-05-10 DIAGNOSIS — E876 Hypokalemia: Secondary | ICD-10-CM | POA: Diagnosis present

## 2024-05-10 DIAGNOSIS — J189 Pneumonia, unspecified organism: Secondary | ICD-10-CM | POA: Diagnosis present

## 2024-05-10 DIAGNOSIS — Z882 Allergy status to sulfonamides status: Secondary | ICD-10-CM | POA: Diagnosis not present

## 2024-05-10 DIAGNOSIS — Z79899 Other long term (current) drug therapy: Secondary | ICD-10-CM | POA: Diagnosis not present

## 2024-05-10 DIAGNOSIS — Z886 Allergy status to analgesic agent status: Secondary | ICD-10-CM | POA: Diagnosis not present

## 2024-05-10 DIAGNOSIS — K219 Gastro-esophageal reflux disease without esophagitis: Secondary | ICD-10-CM | POA: Diagnosis present

## 2024-05-10 DIAGNOSIS — Z87891 Personal history of nicotine dependence: Secondary | ICD-10-CM | POA: Diagnosis not present

## 2024-05-10 DIAGNOSIS — E039 Hypothyroidism, unspecified: Secondary | ICD-10-CM | POA: Diagnosis present

## 2024-05-10 DIAGNOSIS — R0602 Shortness of breath: Secondary | ICD-10-CM | POA: Diagnosis present

## 2024-05-10 DIAGNOSIS — Z7901 Long term (current) use of anticoagulants: Secondary | ICD-10-CM | POA: Diagnosis not present

## 2024-05-10 DIAGNOSIS — I480 Paroxysmal atrial fibrillation: Secondary | ICD-10-CM | POA: Diagnosis present

## 2024-05-10 DIAGNOSIS — Z885 Allergy status to narcotic agent status: Secondary | ICD-10-CM | POA: Diagnosis not present

## 2024-05-10 DIAGNOSIS — H409 Unspecified glaucoma: Secondary | ICD-10-CM | POA: Diagnosis present

## 2024-05-10 DIAGNOSIS — I358 Other nonrheumatic aortic valve disorders: Secondary | ICD-10-CM | POA: Diagnosis present

## 2024-05-10 DIAGNOSIS — Z79890 Hormone replacement therapy (postmenopausal): Secondary | ICD-10-CM | POA: Diagnosis not present

## 2024-05-10 DIAGNOSIS — K5732 Diverticulitis of large intestine without perforation or abscess without bleeding: Secondary | ICD-10-CM | POA: Diagnosis present

## 2024-05-10 LAB — CBC
HCT: 26.9 % — ABNORMAL LOW (ref 36.0–46.0)
Hemoglobin: 9 g/dL — ABNORMAL LOW (ref 12.0–15.0)
MCH: 30.8 pg (ref 26.0–34.0)
MCHC: 33.5 g/dL (ref 30.0–36.0)
MCV: 92.1 fL (ref 80.0–100.0)
Platelets: 315 K/uL (ref 150–400)
RBC: 2.92 MIL/uL — ABNORMAL LOW (ref 3.87–5.11)
RDW: 13.2 % (ref 11.5–15.5)
WBC: 8.5 K/uL (ref 4.0–10.5)
nRBC: 0 % (ref 0.0–0.2)

## 2024-05-10 LAB — BASIC METABOLIC PANEL WITH GFR
Anion gap: 6 (ref 5–15)
BUN: 18 mg/dL (ref 8–23)
CO2: 37 mmol/L — ABNORMAL HIGH (ref 22–32)
Calcium: 8.1 mg/dL — ABNORMAL LOW (ref 8.9–10.3)
Chloride: 94 mmol/L — ABNORMAL LOW (ref 98–111)
Creatinine, Ser: 1.16 mg/dL — ABNORMAL HIGH (ref 0.44–1.00)
GFR, Estimated: 43 mL/min — ABNORMAL LOW (ref >60)
Glucose, Bld: 107 mg/dL — ABNORMAL HIGH (ref 70–99)
Potassium: 3.5 mmol/L (ref 3.5–5.1)
Sodium: 137 mmol/L (ref 135–145)

## 2024-05-10 LAB — BRAIN NATRIURETIC PEPTIDE: B Natriuretic Peptide: 98.4 pg/mL (ref 0.0–100.0)

## 2024-05-10 LAB — STREP PNEUMONIAE URINARY ANTIGEN: Strep Pneumo Urinary Antigen: NEGATIVE

## 2024-05-10 LAB — T4, FREE: Free T4: 1.2 ng/dL — ABNORMAL HIGH (ref 0.61–1.12)

## 2024-05-10 MED ORDER — AZITHROMYCIN 500 MG PO TABS
500.0000 mg | ORAL_TABLET | Freq: Every day | ORAL | Status: AC
  Administered 2024-05-10 – 2024-05-14 (×5): 500 mg via ORAL
  Filled 2024-05-10 (×5): qty 1

## 2024-05-10 MED ORDER — CLORAZEPATE DIPOTASSIUM 7.5 MG PO TABS: 7.5000 mg | ORAL_TABLET | Freq: Every evening | ORAL | 0 refills | Status: AC | PRN

## 2024-05-10 MED ORDER — POTASSIUM CHLORIDE CRYS ER 20 MEQ PO TBCR
40.0000 meq | EXTENDED_RELEASE_TABLET | Freq: Once | ORAL | Status: DC
  Filled 2024-05-10: qty 2

## 2024-05-10 MED ORDER — FUROSEMIDE 10 MG/ML IJ SOLN
20.0000 mg | Freq: Every day | INTRAMUSCULAR | Status: DC
  Filled 2024-05-10: qty 2

## 2024-05-10 MED ORDER — MAGNESIUM SULFATE 2 GM/50ML IV SOLN
2.0000 g | Freq: Once | INTRAVENOUS | Status: AC
  Administered 2024-05-10: 2 g via INTRAVENOUS
  Filled 2024-05-10: qty 50

## 2024-05-10 MED ORDER — ACETAMINOPHEN 325 MG PO TABS
650.0000 mg | ORAL_TABLET | Freq: Four times a day (QID) | ORAL | Status: DC | PRN
  Filled 2024-05-10 (×5): qty 2

## 2024-05-10 MED ORDER — IPRATROPIUM-ALBUTEROL 0.5-2.5 (3) MG/3ML IN SOLN
3.0000 mL | Freq: Four times a day (QID) | RESPIRATORY_TRACT | Status: DC | PRN
  Administered 2024-05-11 (×2): 3 mL via RESPIRATORY_TRACT
  Filled 2024-05-10 (×6): qty 3

## 2024-05-10 MED ORDER — FUROSEMIDE 10 MG/ML IJ SOLN: 20.0000 mg | Freq: Two times a day (BID) | INTRAMUSCULAR | Status: DC

## 2024-05-10 MED ORDER — FUROSEMIDE 10 MG/ML IJ SOLN
20.0000 mg | Freq: Every day | INTRAMUSCULAR | Status: DC
  Filled 2024-05-10 (×2): qty 2

## 2024-05-10 MED ORDER — BENZONATATE 100 MG PO CAPS
100.0000 mg | ORAL_CAPSULE | Freq: Three times a day (TID) | ORAL | Status: DC | PRN
  Filled 2024-05-10 (×4): qty 1

## 2024-05-10 MED ORDER — SODIUM CHLORIDE 0.9 % IV SOLN
1.0000 g | INTRAVENOUS | Status: DC
  Administered 2024-05-10 – 2024-05-14 (×5): 1 g via INTRAVENOUS
  Filled 2024-05-10 (×7): qty 10

## 2024-05-10 MED ORDER — POTASSIUM CHLORIDE 20 MEQ PO PACK
40.0000 meq | PACK | Freq: Once | ORAL | Status: AC
  Administered 2024-05-10: 40 meq via ORAL
  Filled 2024-05-10: qty 2

## 2024-05-10 NOTE — Progress Notes (Signed)
 Arrived at patients bedside and reintroduced myself to patient. Pt confirmed name and date of birth for me again. 4E nurse was in the room with me while I got patient loaded into the w/c for transport home. Pt left with all belongings as well as labels and ID band both on her person and attached to the labels. Pt was transferred to the w/c and moved to the [place]. Pt transported home safely and secure. Pt and I met son and Paramedic Delon outside. Pt was moved from the [place] to her home using her walker which son provided. Pt stated she wanted to walk. Once inside [facility] equipment was setup and vitals were taken and uploaded to chart. Pt was placed on O2 via nasal cannula due to low oxygen saturations <88%. Pts oxygen saturations improved after oxygen admin. The use of the tablet and medications were gone over by myself, Delon EMT-P, and RN Shanda. Skin assessment was completed with RN Shanda. Pt was left in care of EMT-P Delon and virtual RN Shanda. No other tasks needed at this time.

## 2024-05-10 NOTE — Plan of Care (Signed)

## 2024-05-10 NOTE — Assessment & Plan Note (Signed)
 05/10/2024  CT chest shows LLL pneumonia. Pt has been on po doxycycline prior to admission but she still has a cough.   --Start IV Rocephin /po zithromax .  --Send Strep pneumo and legionella antigen.  --Will give her first dose of IV rocephin  in the hospital before she transfers to [facility]. 12/14 -- continue IV Rocephin  and PO Zithromax .  Weaned to room air but had O2 desats overnight, having some brief dips in sats today on room air but improve above 90 per [facility] monitoring 12/15 - continue IV Rocephin  & PO Zithromax  - day 4 antibiotics 12/16 - day 5 antibiotics, completing PO Zithromax .  IV Rocephin  today and transition to PO tomorrow to complete 7-10 day course  12/17: patient completed 5 day course of abx. No antibiotics will be provided on discharge.

## 2024-05-10 NOTE — Subjective & Objective (Signed)
 Pt seen and examined. Breathing better. Seen by PT and cleared for HHPT. Discussed with pt and son Tim(via phone) that pt qualifies for [facility]. Both pt and son are interested and have agreed to be transferred to program. Written consent obtained.

## 2024-05-10 NOTE — Assessment & Plan Note (Addendum)
 05/09/2024 give po kcl 40 meq 05/10/2024 K up to 3.5, will give another 40 meq kcl po x 1 today 12/14 - K 3.9 today, no Lasix  being given. 12/15 - labs pending for today --Repeat BMP tomorrow --Replace K as needed

## 2024-05-10 NOTE — Progress Notes (Signed)
 Pt transferred to hospital at home. CCMD discontinued. IV left in place. Pt alert and oriented X4 at discharge. Left via wheelchair with paramedic. All belongings with patient.

## 2024-05-10 NOTE — Care Management Obs Status (Signed)
 MEDICARE OBSERVATION STATUS NOTIFICATION   Patient Details  Name: Vanessa Fernandez MRN: [id number] Date of Birth: 07-Mar-1926   Medicare Observation Status Notification Given:  Yes    Alaijah Gibler G., RN 05/10/2024, 9:17 AM

## 2024-05-10 NOTE — Progress Notes (Signed)
 Mobility Specialist Progress Note;   05/10/24 1208  Mobility  Activity Ambulated with assistance;Pivoted/transferred to/from [facility];Pivoted/transferred from bed to chair (in room)  Level of Assistance Contact guard assist, steadying assist  Assistive Device Other (Comment) (IV pole)  Distance Ambulated (ft) 25 ft  Activity Response Tolerated well  Mobility Referral Yes  Mobility visit 1 Mobility  Mobility Specialist Start Time (ACUTE ONLY) 1208  Mobility Specialist Stop Time (ACUTE ONLY) 1228  Mobility Specialist Time Calculation (min) (ACUTE ONLY) 20 min   Pt agreeable to mobility. RN requested urine sample from pt, transferred pt to [facility] and was able to void. Required light MinG assistance to ambulate in room. Pt agreeable to sitting up in chair. VSS on RA. Pt left comfortably in chair with all needs met. RN notified.   Lauraine Erm Mobility Specialist Please contact via SecureChat or [employer] [phone]

## 2024-05-10 NOTE — Progress Notes (Addendum)
 PROGRESS NOTE    Vanessa Fernandez  FMW:[id number] DOB: 02/14/1926 DOA: 05/08/2024 PCP: Lelon Norleen ORN., MD  Subjective: Pt seen and examined. Breathing better. Seen by PT and cleared for HHPT. Discussed with pt and son Tim(via phone) that pt qualifies for Hospital at [facility] program. Both pt and son are interested and have agreed to be transferred to program. Written consent obtained.   Hospital Course: CC: cough, congestion HPI: Vanessa Fernandez is a 89 year old female with history of A-fib with sinus pause/bradycardia on Eliquis  and flecainide  with recent pacemaker placement on 04/22/2024 at [facility], hypothyroidism presented to [facility] ED for chief complaint shortness of breath, cough x 2 weeks.. Recently completed 2 weeks of antibiotics, doxycycline. Denied of having any fever or chills.     ED Evaluation: POA: Tachypneic to 30 initially.  SpO2 88% on room air and improved with 2 L [place].     Blood pressure 133/63, pulse 83, temperature 98.2 F (36.8 C), temperature source Oral, RR (!) 22, height 5' 3 (1.6 m), weight 79.4 kg, SpO2 100% 2 L.   Significant Events: Admitted 05/08/2024 for SOB, acute diastolic CHF 05-10-2024 transferred to [facility] program  Admission Labs: Covid,rsv, flu negative WBC 8.7, HgB 9.4, plt 345 Pro-BNP 1418 Na 139, K 3.3, CO2 of 29, BUN 9, Scr 0.82, glu 101 Mg 1.6 TSH 11.247 Procalcitonin <0.10  Admission Imaging Studies: CXR  Left lower lobe consolidation, which may reflect atelectasis or pneumonia. 2. Small bilateral pleural effusions, left greater than right. 3. Enlarged cardiac silhouette and pulmonary vascular congestion, without overt edema. 4. Dual lead pacemaker as above  Significant Labs:   Significant Imaging Studies:   Antibiotic Therapy: Anti-infectives (From admission, onward)    Start     Dose/Rate Route Frequency Ordered Stop   05/09/24 1100  levofloxacin  (LEVAQUIN ) tablet 250 mg        250 mg Oral Daily 05/09/24 1009          Procedures:   Consultants:     Assessment and Plan: * SOB (shortness of breath) 05/09/2024 - Shortness of breath, dry cough, tachypnea -Elevated BNP <1400 chest x-ray consistent with some congestion, possible in lower lobes -Cannot rule out viral pneumonia, patient remained afebrile, mildly hypoxic with no leukocytosis -Influenza A/B SARS-CoV-2-all negative -Due to comorbidities, initiating empiric antibiotics PO Levaquin  for few days  Will Obtaining procalcitonin level, will de-escalate antibiotics accordingly - Continuing gentle diuresis with IV Lasix   05/10/2024 procalcitonin negative. Will get CT chest without contrast just to make sure she does not have a occult pneumonia but I doubt it. Stop levaquin  for now.  **update.  CT chest shows LLL pneumonia. Start IV rocephin /po zithromax .   LLL pneumonia 05/10/2024  *update. CT chest shows LLL pneumonia. Pt has been on po doxycycline prior to admission but she still has a cough.  Start IV Rocephin /po zithromax . Send Strep pneumo and legionella antigen. Will give her first dose of IV rocephin  in the hospital before she transfers to [facility].   Acute diastolic CHF (congestive heart failure) (HCC) 05/09/2024 - It appears she may have HFpEF - Elevated proBNP 1418.0, with some congestions present on imaging -With underlying history of A-fib, falls, status post pacemaker placement -Continuing IV diuretics Lasix  -Monitoring I's and O's, daily weight  -Echo from [facility] 12-20- 25,; reviewed EF 60-65%, mild LVH, mild MR, mild TR, aortic valve calcification, trivial pericardial effusion  05/10/2024 urine output about 1.5 liters yesterday. Pt feeling better. Pt had Pro-BNP on  admission that was elevated and BNP today that was normal. Will check bnp tomorrow just so we can compare same lab tests. Given her rise of serum CO2, Bun and Scr, will change IV lasix  to 20 mg daily(next dose tomorrow). She did get dose of IV lasix   20 mg daily.    Hypomagnesemia 05/10/2024 Mg not repleted yesterday. Given hx of PAF. Will give 2 grams IV Mg today. Repeat Mg in AM.  Hypokalemia 05/09/2024 give po kcl 40 meq  05/10/2024 K up to 3.5, will give another 40 meq kcl po x 1 today    Acute respiratory failure with hypoxia (HCC) 05/10/2024 RA sats documented on 05-09-2024 of 83%. Placed on supplemental O2.  Pt not on home O2. Could be due to acute CHF. Continue to monitor. Home O2 evaluation prior to discharge. Pt does state she did smoke when she was younger. Started at age 83. Smoked about 30 years. Quit in the 1960s.   Acquired hypothyroidism 05/09/2024 Continue Synthroid  - will Check TSH  05/10/2024 TSH elevated at 11.247. add-on FT4 to today's labs. Continue synthroid  112 mcg for now.   PAF (paroxysmal atrial fibrillation) (HCC) 05/09/2024 History of A-fib, with pauses, status post dual-chamber pacemaker placement in [facility] on 04/22/24-sutures were removed yesterday 05/08/2024 -Rhythm is well-controlled -Continue home medication of Eliquis , flecainide   05/10/2024 currently in NSR on telemetry. She just had PPM placed last month at [facility]. She can f/u with her cardiologist at [facility] after discharge.   GERD (gastroesophageal reflux disease) 05/09/2024 Continue PPI  05/10/2024 pt was on pepcid  at home. Stop ppi.   Diverticulitis-resolved as of 05/10/2024 Recent history of diverticulitis, denies any abdominal pain -S/p recent treatment with doxycycline   DVT prophylaxis: Place TED hose Start: 05/10/24 1010 Place TED hose Start: 05/10/24 0942 SCDs Start: 05/09/24 0943 apixaban  (ELIQUIS ) tablet 5 mg Start: 05/09/24 0100 apixaban  (ELIQUIS ) tablet 5 mg     Code Status: Full Code Family Communication: discussed with pt and son Tim(via phone).  Tim's cell # [phone], pt's cell # [phone] Disposition Plan: transfer to [facility] program today Reason for continuing need for  hospitalization: remains on IV lasix  and supplemental O2.  Objective: Vitals:   05/10/24 0400 05/10/24 0402 05/10/24 0534 05/10/24 0713  BP:  (!) 119/52  (!) 98/41  Pulse:  86  81  Resp: 20 20 20  (!) 22  Temp:  99.1 F (37.3 C)  97.8 F (36.6 C)  TempSrc:  Oral  Oral  SpO2:  98%  97%  Weight:   77.7 kg   Height:        Intake/Output Summary (Last 24 hours) at 05/10/2024 1135 Last data filed at 05/10/2024 9373 Gross per 24 hour  Intake 350 ml  Output 1525 ml  Net -1175 ml   Filed Weights   05/09/24 0108 05/10/24 0534  Weight: 79.4 kg 77.7 kg    Examination:  Physical Exam Vitals and nursing note reviewed.  Constitutional:      General: She is not in acute distress.    Appearance: She is normal weight. She is not toxic-appearing or diaphoretic.  HENT:     Head: Normocephalic and atraumatic.     Nose: Nose normal.  Eyes:     General: No scleral icterus. Cardiovascular:     Rate and Rhythm: Normal rate and regular rhythm.     Pulses: Normal pulses.     Heart sounds: Normal heart sounds.  Pulmonary:     Effort: No respiratory  distress.     Breath sounds: Rales present.     Comments: Scattered rales at left base. No wheezing Abdominal:     General: Abdomen is flat. Bowel sounds are normal.     Palpations: Abdomen is soft.  Musculoskeletal:     Right lower leg: No edema.     Left lower leg: No edema.  Skin:    General: Skin is warm and dry.     Capillary Refill: Capillary refill takes less than 2 seconds.  Neurological:     General: No focal deficit present.     Mental Status: She is alert and oriented to person, place, and time.     Data Reviewed: I have personally reviewed following labs and imaging studies  CBC: Recent Labs  Lab 05/08/24 1800 05/10/24 0235  WBC 8.7 8.5  NEUTROABS 5.5  --   HGB 9.4* 9.0*  HCT 29.1* 26.9*  MCV 91.8 92.1  PLT 345 315   Basic Metabolic Panel: Recent Labs  Lab 05/08/24 1800 05/09/24 1007 05/10/24 0235  NA 139   --  137  K 3.3*  --  3.5  CL 99  --  94*  CO2 29  --  37*  GLUCOSE 101*  --  107*  BUN 9  --  18  CREATININE 0.82 1.06* 1.16*  CALCIUM  8.5*  --  8.1*  MG  --  1.6*  --   PHOS  --  3.4  --    GFR: Estimated Creatinine Clearance: 28 mL/min (A) (by C-G formula based on SCr of 1.16 mg/dL (H)).  ProBNP, BNP (last 5 results) Recent Labs    05/08/24 1800 05/10/24 0235  PROBNP 1,418.0*  --   BNP  --  98.4   Thyroid Function Tests: Recent Labs    05/09/24 1007  TSH 11.247*   Sepsis Labs: Recent Labs  Lab 05/09/24 1030  PROCALCITON <0.10    Recent Results (from the past 240 hours)  Resp panel by RT-PCR (RSV, Flu A&B, Covid) Anterior Nasal Swab     Status: None   Collection Time: 05/08/24  4:54 PM   Specimen: Anterior Nasal Swab  Result Value Ref Range Status   SARS Coronavirus 2 by RT PCR NEGATIVE NEGATIVE Final    Comment: (NOTE) SARS-CoV-2 target nucleic acids are NOT DETECTED.  The SARS-CoV-2 RNA is generally detectable in upper respiratory specimens during the acute phase of infection. The lowest concentration of SARS-CoV-2 viral copies this assay can detect is 138 copies/mL. A negative result does not preclude SARS-Cov-2 infection and should not be used as the sole basis for treatment or other patient management decisions. A negative result may occur with  improper specimen collection/handling, submission of specimen other than nasopharyngeal swab, presence of viral mutation(s) within the areas targeted by this assay, and inadequate number of viral copies(<138 copies/mL). A negative result must be combined with clinical observations, patient history, and epidemiological information. The expected result is Negative.  Fact Sheet for Patients:  bloggercourse.com  Fact Sheet for Healthcare Providers:  seriousbroker.it  This test is no t yet approved or cleared by the [place]  FDA and  has been authorized for  detection and/or diagnosis of SARS-CoV-2 by FDA under an Emergency Use Authorization (EUA). This EUA will remain  in effect (meaning this test can be used) for the duration of the COVID-19 declaration under Section 564(b)(1) of the Act, 21 U.S.C.section 360bbb-3(b)(1), unless the authorization is terminated  or revoked sooner.  Influenza A by PCR NEGATIVE NEGATIVE Final   Influenza B by PCR NEGATIVE NEGATIVE Final    Comment: (NOTE) The Xpert Xpress SARS-CoV-2/FLU/RSV plus assay is intended as an aid in the diagnosis of influenza from Nasopharyngeal swab specimens and should not be used as a sole basis for treatment. Nasal washings and aspirates are unacceptable for Xpert Xpress SARS-CoV-2/FLU/RSV testing.  Fact Sheet for Patients: bloggercourse.com  Fact Sheet for Healthcare Providers: seriousbroker.it  This test is not yet approved or cleared by the [place]  FDA and has been authorized for detection and/or diagnosis of SARS-CoV-2 by FDA under an Emergency Use Authorization (EUA). This EUA will remain in effect (meaning this test can be used) for the duration of the COVID-19 declaration under Section 564(b)(1) of the Act, 21 U.S.C. section 360bbb-3(b)(1), unless the authorization is terminated or revoked.     Resp Syncytial Virus by PCR NEGATIVE NEGATIVE Final    Comment: (NOTE) Fact Sheet for Patients: bloggercourse.com  Fact Sheet for Healthcare Providers: seriousbroker.it  This test is not yet approved or cleared by the [place]  FDA and has been authorized for detection and/or diagnosis of SARS-CoV-2 by FDA under an Emergency Use Authorization (EUA). This EUA will remain in effect (meaning this test can be used) for the duration of the COVID-19 declaration under Section 564(b)(1) of the Act, 21 U.S.C. section 360bbb-3(b)(1), unless the authorization is  terminated or revoked.  Performed at [facility], [address], [place], [place] [address]      Radiology Studies: CT CHEST WO CONTRAST Result Date: 05/10/2024 CLINICAL DATA:  Shortness of breath and cough. EXAM: CT CHEST WITHOUT CONTRAST TECHNIQUE: Multidetector CT imaging of the chest was performed following the standard protocol without IV contrast. RADIATION DOSE REDUCTION: This exam was performed according to the departmental dose-optimization program which includes automated exposure control, adjustment of the mA and/or kV according to patient size and/or use of iterative reconstruction technique. COMPARISON:  12/07/2023 FINDINGS: Cardiovascular: The heart is enlarged. Small pericardial effusion is new in the interval. Coronary artery calcification is evident. Mild atherosclerotic calcification is noted in the wall of the thoracic aorta. Left-sided permanent pacemaker noted. Mediastinum/Nodes: No mediastinal lymphadenopathy. No evidence for gross hilar lymphadenopathy although assessment is limited by the lack of intravenous contrast on the current study. The esophagus has normal imaging features. There is no axillary lymphadenopathy. Lungs/Pleura: 12 mm irregular opacity in the right lower lobe (64/4) may reflect atelectasis or scarring. There is left greater than right lower lobe collapse/consolidation with small left and tiny right pleural effusions. 2 mm left upper lobe pulmonary nodule is stable in the interval, likely benign. Upper Abdomen: Visualized portion of the upper abdomen shows no acute findings. Musculoskeletal: No worrisome lytic or sclerotic osseous abnormality. IMPRESSION: 1. Left greater than right lower lobe collapse/consolidation with small left and tiny right pleural effusions. 2. 12 mm irregular opacity in the right lower lobe may reflect atelectasis or scarring. Follow-up CT chest in 3 months recommended to ensure resolution. 3. Small pericardial effusion, new  in the interval. 4.  Aortic Atherosclerosis (ICD10-I70.0). Electronically Signed   By: Camellia Candle M.D.   On: 05/10/2024 11:32   DG Chest 2 View Result Date: 05/08/2024 CLINICAL DATA:  Dry cough and congestion for 1 week, recent pacemaker placement EXAM: CHEST - 2 VIEW COMPARISON:  02/09/2024 FINDINGS: Frontal and lateral views of the chest demonstrate dual lead pacer within the left anterior chest, proximal lead in the region of the right atrium  and distal lead in the region of the right ventricle. Mild enlargement of the cardiac silhouette. Pulmonary vascular congestion without overt edema. There is left lower lobe consolidation along the posterior costophrenic angle, with small left pleural effusion noted. Trace right pleural effusion. No pneumothorax. No acute bony abnormalities. IMPRESSION: 1. Left lower lobe consolidation, which may reflect atelectasis or pneumonia. 2. Small bilateral pleural effusions, left greater than right. 3. Enlarged cardiac silhouette and pulmonary vascular congestion, without overt edema. 4. Dual lead pacemaker as above. Electronically Signed   By: Ozell Daring M.D.   On: 05/08/2024 18:01   EKG on my review shows NSR    Scheduled Meds:  acetaminophen   1,000 mg Oral Once   apixaban   5 mg Oral BID   azithromycin   500 mg Oral Daily   benzonatate   100 mg Oral TID   docusate sodium   100 mg Oral BID   famotidine   20 mg Oral Daily   flecainide   25 mg Oral q AM   flecainide   50 mg Oral QHS   [START ON 05/11/2024] furosemide   20 mg Intravenous Daily   lactobacillus  1 g Oral TID WC   latanoprost   1 drop Both Eyes QHS   levothyroxine   112 mcg Oral Q0600   metoprolol  succinate  12.5 mg Oral Daily   potassium chloride   40 mEq Oral Once   sodium chloride  flush  3 mL Intravenous Q12H   sodium chloride  flush  3 mL Intravenous Q12H   Continuous Infusions:  cefTRIAXone  (ROCEPHIN )  IV     magnesium  sulfate bolus IVPB 2 g (05/10/24 1056)     LOS: 0 days   Time  spent: 65 minutes  Camellia Door, DO  [facility]  05/10/2024, 11:35 AM   Hospital at Home Admission Criteria Checklist:  Formal consent explained in detail and signed at the bedside: yes Patient meets inpatient admission criteria (see below for further details) yes Is pt Medicare FFS/[employer] Medicare-Medicaid, [employer], [employer] ( required for initial launch with plan to expand)? yes Lives within 25 mil/ 30 min from [facility] within [place](pt may stay with family member during admission who lives within 25 miles or 30 min from [facility] w/in [facility])? yes Hemodynamically stable with relatively low risk of clinical deterioration-not requiring ICU? yes Age >55? yes Does not require frequent touch-points or complex interventions/medications (ie Titrated Infusions (IV insulin , heparin  drips, vasoactive drips, use of infused or injectable controlled substances, patients on insulin )? yes Any Behavioral Health comorbidities likely to increase risk for in-home care (ie Acute delirium or experiencing a marked altered mental status and cause is not a treatable condition in the home)? no Has the patient been on BIPAP during course of ED evaluation or hospitalization? no IF YES, Has the patient been off of BIPAP for >24 hours(If NO-THEN PATIENT DOES MEET INCLUSION CRITERIA)? not applicable On Room Air or Needs oxygen at home (<6L)? is not on home oxygen therapy. Active safety concerns (ie Unable to use bedside commode independently and lacks caregiver support for safety- needs SNF placement, unable to obtain IV access)? no Has skin check been performed? yes  Has Physical Therapy screened the patient? yes  Common admission diagnoses including: CAP, COPD Exacerbation, Acute on chronic heart failure, Cellulitis, UTI , dehydration, acute resp failure with hypoxia (requiring <5L)   Social Screening:  - Has the family been directly contacted about Hospital at Home program with consent  obtained (if yes- please document who was spoken to with name and  phone number)? yes. Spoke with pt's son Reilley Valentine. [phone]  -Was the family approached about the use of TOC pharmacy for medications at discharge? yes. She wishes to use [facility] pharmacy Denies significant ETOH intake? yes Does not smoke and understands may not smoke in the presence of oxygen? yes Patient states able to use iPad/phone for communication/has family who is able to use? yes Patient has agreed to be compliant with medication and treatment regimen of the program? yes Any active drug use in patient or primary caregiver including daily dosing of methadone? no Stable home environment ( access to appropriate heating in cold conditions and/or appropriate air conditioning in hot conditions and/or no running water/electricity)? yes  No aggressive pets at home? no Firearm present? no  With ability or willingness to store them unloaded in a locked case for duration of hospitalization? not applicable Ambulatory? yes  no difficulty Bed bugs present on home evaluation? not applicable Family support system in place? yes Patient feels safe at home and does not endorse any violence? yes Any actively decompensated behavioral health issues including agitation/aggressive behavior? no  Patient requests food to be provided by hospital home program? no PT/OT eval completed and not requiring SNF, ALF, inpatient rehab? yes  To be admitted to the Hospital at [facility] program, a patient generally must meet the following: 1. Requirement for Inpatient Level of Care: The patient's condition must necessitate an inpatient level of care. This is typically indicated by one or more of the following, depending on their specific diagnosis:  Persistent tachycardia despite appropriate treatment (e.g., for Heart Failure, UTI). Persistent tachypnea (rapid breathing) or dyspnea (shortness of breath) that hasn't improved sufficiently with observation care (e.g.,  for Heart Failure, Pneumonia, Viral Illness, COVID). Hypoxemia (low oxygen levels), such as a new need for oxygen, an increased need from baseline, or specific oxygen saturation levels (e.g., SpO2 <90-94% depending on the condition) that persist despite observation (e.g., for Heart Failure, COPD, Pneumonia, Viral Illness, COVID). Need for Intravenous (IV) hydration due to an inability to maintain oral hydration, which persists despite observation care (e.g., for Cellulitis, UTI, Viral Illness, COVID). Specific to Heart Failure: Persistent pulmonary edema, indicated by a new oxygen need, lack of improvement with IV diuretics, and ongoing tachypnea/dyspnea. Specific to COPD: A decrease in known baseline resting oxygen saturation (SpO2) by 4% or more, or an increase in pre-existing supplemental oxygen requirements, which persists despite observation and requires continued close monitoring. Specific to Pneumonia: A Pneumonia Severity Index (PSI) class IV (moderate risk). Specific to Cellulitis: Failure of outpatient antibiotic therapy (indicated by progression or no improvement after a minimum of 48 hours on an adequate regimen) or a clinical presentation (like acuity or rapidity of progression) that requires the intensity of monitoring found in an inpatient setting. Specific to UTI: Persistence or worsening of clinical findings like fever, pain, or dehydration despite observation care; presence of significant uropathy; suspected infection of an indwelling prosthetic device, stent, implant, or graft; or pregnancy with suspected pyelonephritis.  2. Appropriateness for Hospital at Home Setting: The patient's overall clinical picture, including the severity of their illness, their care needs, and their medical history and comorbidities, must be suitable for management in the Hospital at Home environment. This essentially means that none of the exclusion criteria (listed below) are met.  Unified Exclusion  Criteria for Hospital at Home Admission: A patient would not be eligible for Hospital at Home if any of the following are present: Hemodynamic Instability: Hypotension (low blood pressure) is present.  Respiratory Instability or Needs Beyond Program Capability: There is a new need for invasive or noninvasive ventilatory assistance (like BiPAP or a ventilator). Oxygenation is not sufficient, generally indicated if an FiO2 (fraction of inspired oxygen) of 45% (which is about 6 Liters/minute via nasal cannula) or more is required to keep oxygen saturation (SpO2) at 90% or greater. Monitoring or Procedural Needs Beyond Program Capability: There is a need for invasive monitoring, such as a pulmonary artery catheter or an arterial line. There is a need for immediate-response telemetry monitoring (for dangerous arrhythmia detection and subsequent immediate intervention). The required medication regimen is beyond the capabilities of Hospital at Home (e.g., dosing intervals are too frequent for home administration). There is a need for a procedure that cannot be performed by the [facility] at [facility] team (e.g., significant wound debridement or abscess drainage for cellulitis, or percutaneous nephrostomy for a complicated UTI). Significant Organ Dysfunction or Markers of Severe Illness: Mental status is not at baseline, or there is altered mental status suggestive of inadequate perfusion. Renal (kidney) function is unstable or showing an ongoing decline. There is evidence of inadequate perfusion, such as metabolic acidosis or myocardial ischemia. Uncompensated acidosis is present. Condition-Specific Severity or Complications Making Home Care Unsuitable: For Heart Failure: Known severe cardiac valvular disease (e.g., aortic stenosis, mitral regurgitation); or severe peripheral edema that impairs the ability to urinate or ambulate. For COPD: Known concurrent comorbidity or finding that indicates a higher-risk COPD  exacerbation (e.g., pulmonary fibrosis, cavitation, pleural effusion, pneumothorax, rib fracture). For Pneumonia: Pneumonia Severity Index (PSI) class V (indicating high risk for inpatient mortality); known concurrent comorbidity or finding that indicates higher-risk pneumonia (e.g., pulmonary fibrosis, cavitation, large or loculated pleural effusion); or a concomitant serious infectious process like endocarditis or empyema. For Cellulitis: Orbital, periorbital, or necrotizing infection is suspected; or a concomitant serious infectious process like endocarditis, septic emboli, or septic joint space infection. For UTI: Urinary tract obstruction (e.g., kidney stone, bladder outlet obstruction); or a concomitant serious infectious process like endocarditis or septic emboli. For Viral Illness & COVID-19: A concomitant serious infectious process like endocarditis or empyema.  General Comorbidities or Status:  The patient is significantly immunosuppressed (this applies to Pneumonia, Cellulitis, UTI, Viral Illness, and COVID-19). The patient meets inpatient admission criteria for a second diagnosis, or has care needs beyond the capabilities of Hospital at Home due to an active clinically significant comorbidity. (This is a general exclusion across all listed conditions)

## 2024-05-10 NOTE — Progress Notes (Signed)
 Patient data was not showing in current health and armband appeared to be disconnected. Several phone calls made to patient with no answer. Attempt made to reach patient on tablet as well with no success. Called patient's son who stated he had just left patient and that she was in the bed and the armband was in place. Son, Velinda, stated that he would return to the home and check on patient. On arrival patient was sleeping in bed and upset that her son had been called instead of her. Requested that Velinda move current health equipment to the patient's bedroom to see if that would fix the connection issue. He felt comfortable doing so and once he did patient's data began to backfill. Apologized to patient for the inconvenience and verbalized that I will be available all night if needed.

## 2024-05-10 NOTE — Progress Notes (Signed)
 Patient transferred to the [facility] program. Communicated with patient via video tablet. AAOx4. Denies pain and SOB. Oxygen on at 2L [place]. Plan of care reviewed with patient. Patient was told that the virtual nurse is available 24/7. [facility] phone number given to the patient. Tablet usage explained. Skin check completed with Tyrell, paramedic. Patient agreeable with plan of care.

## 2024-05-10 NOTE — Assessment & Plan Note (Signed)
 05/10/2024 RA sats documented on 05-09-2024 of 83%. Placed on supplemental O2.  Pt not on home O2. Could be due to acute CHF. Continue to monitor. Home O2 evaluation prior to discharge. Pt does state she did smoke when she was younger. Started at age 89. Smoked about 30 years. Quit in the 1960s.

## 2024-05-10 NOTE — Progress Notes (Signed)
° °  Son Velinda Owes updated on new dx of LLL pneumonia on CT and starting IV Rocephin /po zithromax .  Camellia Door, DO [facility]

## 2024-05-10 NOTE — Assessment & Plan Note (Addendum)
 05/10/2024 Mg not repleted yesterday. Given hx of PAF. Will give 2 grams IV Mg today. Repeat Mg in AM. 12/14 - Mg 2.0 12/15 - No Mg level needed today 12/16 - mg 1.8 --Monitor & replace Mg PRN

## 2024-05-10 NOTE — Plan of Care (Signed)

## 2024-05-11 DIAGNOSIS — E876 Hypokalemia: Secondary | ICD-10-CM

## 2024-05-11 DIAGNOSIS — J189 Pneumonia, unspecified organism: Principal | ICD-10-CM

## 2024-05-11 DIAGNOSIS — I5033 Acute on chronic diastolic (congestive) heart failure: Secondary | ICD-10-CM

## 2024-05-11 DIAGNOSIS — E039 Hypothyroidism, unspecified: Secondary | ICD-10-CM

## 2024-05-11 DIAGNOSIS — I480 Paroxysmal atrial fibrillation: Secondary | ICD-10-CM

## 2024-05-11 LAB — BASIC METABOLIC PANEL WITH GFR
Anion gap: 8 (ref 5–15)
BUN: 22 mg/dL (ref 8–23)
CO2: 33 mmol/L — ABNORMAL HIGH (ref 22–32)
Calcium: 8.5 mg/dL — ABNORMAL LOW (ref 8.9–10.3)
Chloride: 94 mmol/L — ABNORMAL LOW (ref 98–111)
Creatinine, Ser: 1.26 mg/dL — ABNORMAL HIGH (ref 0.44–1.00)
GFR, Estimated: 39 mL/min — ABNORMAL LOW (ref >60)
Glucose, Bld: 129 mg/dL — ABNORMAL HIGH (ref 70–99)
Potassium: 3.9 mmol/L (ref 3.5–5.1)
Sodium: 135 mmol/L (ref 135–145)

## 2024-05-11 LAB — CBC
HCT: 31.6 % — ABNORMAL LOW (ref 36.0–46.0)
Hemoglobin: 10 g/dL — ABNORMAL LOW (ref 12.0–15.0)
MCH: 29.5 pg (ref 26.0–34.0)
MCHC: 31.6 g/dL (ref 30.0–36.0)
MCV: 93.2 fL (ref 80.0–100.0)
Platelets: 362 K/uL (ref 150–400)
RBC: 3.39 MIL/uL — ABNORMAL LOW (ref 3.87–5.11)
RDW: 13.3 % (ref 11.5–15.5)
WBC: 10.2 K/uL (ref 4.0–10.5)
nRBC: 0 % (ref 0.0–0.2)

## 2024-05-11 LAB — MAGNESIUM: Magnesium: 2 mg/dL (ref 1.7–2.4)

## 2024-05-11 LAB — BRAIN NATRIURETIC PEPTIDE: B Natriuretic Peptide: 293.5 pg/mL — ABNORMAL HIGH (ref 0.0–100.0)

## 2024-05-11 MED ORDER — FUROSEMIDE 10 MG/ML IJ SOLN
20.0000 mg | Freq: Every day | INTRAMUSCULAR | Status: DC
  Filled 2024-05-11: qty 2

## 2024-05-11 MED ORDER — FLECAINIDE ACETATE 50 MG PO TABS
50.0000 mg | ORAL_TABLET | Freq: Two times a day (BID) | ORAL | Status: DC
  Administered 2024-05-11 – 2024-05-14 (×7): 50 mg via ORAL
  Filled 2024-05-11 (×9): qty 1

## 2024-05-11 NOTE — Progress Notes (Signed)
 The pt was admitted today to the hospital at home program. The pt was alert and oriented upon arriving at home. Her skin was warm,dry and appropriate in color. She had a strong radial pulse and showed no sings of labored breathing at this time.   Equipment was set up in the pt's den and medications were obtained from the pt's son. All of the pt's home meds provided by pharmacy and were placed in their appropriate time slots. The pt's home medications were placed in a sealed bag and placed in the kitchen cabinet.   The pt was explained how the medication process will work and how she can go about taking her PRN medication but she stated that she preferred to wait for the filed team paramedic to show up and help her with her medication.   Vitals were obtained and are as noted. The pt was noted to have decrease oxygen saturations at rest and due to same she was placed on oxygen via [place] at 2LPM. Oxygen saturations were noted to improve. Virtual nurse was called and the pt's skin assessment was done. The pt was assisted with her night time medications.   The pt and her son were informed to call if anything were to change throughout the night and they agreed to same. The pt was informed that she will get a call in the morning prior to her visit and she asked that her cell phone be called. At this time the pt's admission was complete.

## 2024-05-11 NOTE — Progress Notes (Signed)
 Medic with patient called [facility] patient reporting increased shortness of breath, concerned HR elevated and c/o afib acting up HR up 99-110. Provider on current health pad with virtual RN to assess patient. PRN order in for EKG if sustained HR 100-115 and up. Patient instructed to hydrate as well primarily drinking propel was educated to mix propel but to be sure to drink some water as well. Provider will also check renal function tomorrow. Patient verbalized understanding and was assured to call us  if she worsened or sustained HR. Verbalized understanding.

## 2024-05-11 NOTE — Progress Notes (Addendum)
 On current health call with Son, field member, provider, and Tax Inspector. BP  Goal O2 > 90% then supplemental use.Patient instructed to wear O2 this evening  Prescribed tessalon  pearls. Patient with son self admin levothyroxine , well tolerated and verbalized waiting before eating. Patient states robitussin no improvement and mucinex  makes her nauseous. Educated to monitior O2 levels before transitioning to oral Abx.as a goal towards d/c. Patient had lunch and self admin florinex with food.

## 2024-05-11 NOTE — Plan of Care (Signed)
°  Problem: Education: Goal: Knowledge of General Education information will improve Description: Including pain rating scale, medication(s)/side effects and non-pharmacologic comfort measures Outcome: Progressing   Problem: Clinical Measurements: Goal: Will remain free from infection Outcome: Progressing   Problem: Clinical Measurements: Goal: Diagnostic test results will improve Outcome: Progressing   Problem: Clinical Measurements: Goal: Respiratory complications will improve Outcome: Progressing   Problem: Clinical Measurements: Goal: Cardiovascular complication will be avoided Outcome: Progressing   Problem: Nutrition: Goal: Adequate nutrition will be maintained Outcome: Progressing   Problem: Elimination: Goal: Will not experience complications related to bowel motility Outcome: Progressing Goal: Will not experience complications related to urinary retention Outcome: Progressing   Problem: Pain Managment: Goal: General experience of comfort will improve and/or be controlled Outcome: Progressing   Problem: Safety: Goal: Ability to remain free from injury will improve Outcome: Progressing

## 2024-05-11 NOTE — Progress Notes (Signed)
 Hospital at Home Daily Progress Note   [name]  MRN: [id number]  DOB: 09-19-1925  DOA: 05/08/2024  DOS: the patient was seen and examined on @TODAY @    Patient identified themself as Vanessa Fernandez  DOB 27-Nov-1925  Medic Lauraine Faes present in the home during encounter and performed the assessment and physical exam.  Patient was seen today via video conference; my physical location [place].   Brief hospital course:  CC: cough, congestion HPI: Vanessa Fernandez is a 89 year old female with history of A-fib with sinus pause/bradycardia on Eliquis  and flecainide  with recent pacemaker placement on 04/22/2024 at [facility], hypothyroidism presented to [facility] ED for chief complaint shortness of breath, cough x 2 weeks.. Recently completed 2 weeks of antibiotics, doxycycline. Denied of having any fever or chills.     ED Evaluation: POA: Tachypneic to 30 initially.   SpO2 88% on room air and improved with 2 L [place].   BP 133/63, pulse 83, afebrile 98.2 F, RR (!) 22, SpO2 100% 2 L.   Patient was admitted for acute respiratory failure with hypoxia due to pneumonia and mild decompensation of diastolic CHF as outlined in detail below.   Assessment and Plan:  * SOB (shortness of breath) 05/09/2024 - Shortness of breath, dry cough, tachypnea -Elevated BNP <1400 chest x-ray consistent with some congestion, possible in lower lobes -Cannot rule out viral pneumonia, patient remained afebrile, mildly hypoxic with no leukocytosis -Influenza A/B SARS-CoV-2-all negative -Due to comorbidities, initiating empiric antibiotics PO Levaquin  for few days  Will Obtaining procalcitonin level, will de-escalate antibiotics accordingly - Continuing gentle diuresis with IV Lasix   05/10/2024 procalcitonin negative. Will get CT chest without contrast just to make sure she does not have a occult pneumonia but I doubt it. Stop levaquin  for now. UPDATE - CT chest shows LLL pneumonia. Start  IV rocephin /po zithromax .  12/14 -- pt clinically improving, but continues to have O2 desats into 80's during sleep and intermittently througout day --Continue Rocephin / Zithromax  --Stop Lasix  with Cr bump --Duonebs, antitussives & other supportive care per orders  LLL pneumonia 05/10/2024  *update. CT chest shows LLL pneumonia. Pt has been on po doxycycline prior to admission but she still has a cough.   --Start IV Rocephin /po zithromax .  --Send Strep pneumo and legionella antigen.  --Will give her first dose of IV rocephin  in the hospital before she transfers to [facility].  12/14 -- continue IV Rocephin  and PO Zithromax .  Weaned to room air but had O2 desats overnight, having some brief dips in sats today on room air but improve above 90 per [facility] monitoring  Acute diastolic CHF (congestive heart failure) (HCC) 05/09/2024 - It appears she may have HFpEF.   - Elevated proBNP 1418.0, with some congestions present on imaging -With underlying history of A-fib, falls, status post pacemaker placement -Continuing IV diuretics Lasix  -Monitoring I's and O's, daily weight  Echo from [facility] 12-20- 25,; reviewed EF 60-65%, mild LVH, mild MR, mild TR, aortic valve calcification, trivial pericardial effusion  05/10/2024 urine output about 1.5 liters yesterday. Pt feeling better. Pt had Pro-BNP on admission that was elevated and BNP today that was normal. Will check bnp tomorrow just so we can compare same lab tests. Given her rise of serum CO2, Bun and Scr, will change IV lasix  to 20 mg daily(next dose tomorrow). She did get dose of IV lasix  20 mg daily.  05/11/24 -- Cr up a bit more today 1.26. --Stop further  diuresis for now. DC Lasix .    Hypomagnesemia 05/10/2024 Mg not repleted yesterday. Given hx of PAF. Will give 2 grams IV Mg today. Repeat Mg in AM. 12/14 - Mg 2.0 --Monitor & replace Mg PRN  Hypokalemia 05/09/2024 give po kcl 40 meq 05/10/2024 K up to 3.5, will give another 40  meq kcl po x 1 today 12/14 - K 3.9 today, no Lasix  being given. --Repeat BMP tomorrow --Replace K as needed    Acute respiratory failure with hypoxia (HCC) 05/10/2024 RA sats 83% yesterday, placed on supplemental O2.  Pt not on home O2. Attributed to acute CHF, later found to have PNA as well.  12/14 - pt having O2 desats to mid-80's overnight. On room air this AM visit with sat 94% on room air 2 hours after removing O2 earlier --Mgmt of PNA and CHF as outlined --Pt to wear O2 when sleeping overnight & agreeable --Supplement O2 PRN to keep sats > 90%. --Continue to monitor.  --Home O2 evaluation prior to discharge.   Pt shared she did smoke when she was younger. Started at age 58. Smoked about 30 years. Quit in the 1960s.   Acquired hypothyroidism 05/09/2024 Continue Synthroid  --Check TSH  05/10/2024 TSH elevated at 11.247. add-on FT4 to today's labs. Continue synthroid  112 mcg for now.  12/14 - free T4 mildly elevated 1.20 (ref range 0.61--1.12).   --Given her reported increase A-fib symptoms, will reduce synthroid  slightly to 100 mcg --Recommend repeat thyroid studies with PCP in 6/-8 weeks   PAF (paroxysmal atrial fibrillation) (HCC) 05/09/2024 History of A-fib, with pauses, status post dual-chamber pacemaker placement in [facility] on 04/22/24-sutures were removed yesterday 05/08/2024  05/10/2024 currently in NSR on telemetry.   12/14 -- this afternoon, pt had some increased palpitations, onset after walking across her home, resolved after resting. HR was 99-110 but improved. HR overall has been controlled. --EKG PRN if HR sustaining > 110 bpm --Continue home Eliquis  and flecainide   She just had PPM placed last month at [facility]. She can f/u with her cardiologist at [facility] after discharge.   GERD (gastroesophageal reflux disease) 12/14 -- continue Pepcid    Diverticulitis-resolved as of 05/10/2024 Recent history of diverticulitis, denies any abdominal  pain -S/p recent treatment with doxycycline     Patient Active Problem List   Diagnosis Date Noted   LLL pneumonia 05/10/2024    Priority: High   SOB (shortness of breath) 05/09/2024    Priority: High   Acute diastolic CHF (congestive heart failure) (HCC) 05/08/2024    Priority: High   Acute respiratory failure with hypoxia (HCC) 05/10/2024    Priority: Medium    Hypokalemia 05/10/2024    Priority: Medium    Hypomagnesemia 05/10/2024    Priority: Medium    PAF (paroxysmal atrial fibrillation) (HCC) 03/28/2022    Priority: Low   Acquired hypothyroidism 03/28/2022    Priority: Low   GERD (gastroesophageal reflux disease)     Priority: Low   Depression       Significant Events: Admitted 05/08/2024 for SOB, acute diastolic CHF 05-10-2024 transferred to [facility] program    Subjective / Interval 24 hour History:  Pt seen virtually during AM medic visit today.  She reports being tired from sleep interruptions overnight related to O2 levels being low.  Feeling okay besides fatigue, plans to take a long nap today. She denies fever/chills.  Medic reports pt has been off O2 x 2 hours and O2 sat on RA 94%.  Pt having  a congested but dry cough, robitussin didn't help, she became nauseated after mucinex .  Pt agreeable to wear O2 tonight during sleep to prevent oxygen sats dropping where the team has to call to check on her and interrupt sleep.  Pt denies other acute complaints.       Admission Labs: Covid,rsv, flu negative WBC 8.7, HgB 9.4, plt 345 Pro-BNP 1418 Na 139, K 3.3, CO2 of 29, BUN 9, Scr 0.82, glu 101 Mg 1.6 TSH 11.247 Procalcitonin <0.10  Admission Imaging Studies: CXR  Left lower lobe consolidation, which may reflect atelectasis or pneumonia. 2. Small bilateral pleural effusions, left greater than right. 3. Enlarged cardiac silhouette and pulmonary vascular congestion, without overt edema. 4. Dual lead pacemaker as above Significant Labs:   Significant  Imaging Studies:   Antibiotic Therapy: Anti-infectives (From admission, onward)    Start     Dose/Rate Route Frequency Ordered Stop   05/10/24 1215  cefTRIAXone  (ROCEPHIN ) 1 g in sodium chloride  0.9 % 100 mL IVPB        1 g 200 mL/hr over 30 Minutes Intravenous Every 24 hours 05/10/24 1128     05/10/24 1215  azithromycin  (ZITHROMAX ) tablet 500 mg        500 mg Oral Daily 05/10/24 1128 05/15/24 0959   05/09/24 1100  levofloxacin  (LEVAQUIN ) tablet 250 mg  Status:  Discontinued        250 mg Oral Daily 05/09/24 1009 05/10/24 1007       Procedures:   Consultants:           Physical Exam:    05/11/2024   10:00 AM 05/10/2024    5:56 PM 05/10/2024    5:16 PM  Vitals with BMI  Height   5' 3  Weight 172 lbs 3 oz    BMI 30.51    Systolic 116  115  Diastolic 60  66  Pulse 98 88 83      General exam: awake, alert, no acute distress HEENT: voice clear, moist mucus membranes, mildly hard of hearing  Respiratory system: CTAB but diminished, no wheezes or rhonchi, normal respiratory effort at rest on room air. Cardiovascular system: normal S1/S2, irregular rhythm, regular rate, no JVD, no edema, pacemaker present.   Gastrointestinal system: soft, NT, ND, +bowel sounds. Central nervous system: A&O x 3. no gross focal neurologic deficits, normal speech Extremities: moves all, no edema Psychiatry: normal mood, congruent affect, judgement and insight appear normal    Data Reviewed:  Notable labs --  Cl 94 Bicarb 37 >> 33 improved Glucose 129 Cr 0.82 >> 1.06 >> 1.16 >> 1.26 BNP 293.5 Hbg stable 9 >> 10  Family Communication: son present during AM visit today  Disposition: Status is: Inpatient Remains inpatient appropriate because: remains on IV antibiotics pending further improvement, ongoing o2 desaturations requiring supplemental O2   Planned Discharge Destination: Home Health    Time spent: 45 minutes  Author: Burnard DELENA Cunning, DO [facility]  01/17/2024 7:00 AM  For on call review [email or web address].

## 2024-05-11 NOTE — Progress Notes (Signed)
 Morning call to check am sat at 89% patient denies any distress spoke to Tim Patients son no distress. Called via phone for self-administration for levothyroxine  completed with son.Paatient equested to wait to florinex when she eats later as she is waiting. Patient took florienex and had breakfast is aware team is headed to her in 15-20 min.

## 2024-05-11 NOTE — Progress Notes (Signed)
 Arrived at home and rang doorbell. Patient came to the door and walked back to her living room, where she was out of breath walking back to the room. She was not wearing oxygen when she came to the door. Initial O2 readings were low. Patient on home O2 and placed on nasal cannula. Readings were more stable but pt complained of increased shortness of breath, and felt like there were frogs in her chest. Patients heart rate was irregularly between 99-110 and stated that her afib had felt less controlled since this morning. Hub notified and spoke with RN and MD. Myles to hydrate more and perform EKG if sustained 100-110 HR. Patient advised to call with any additional concerns. Tesslon pearl given for cough. IV flushed with minor pressure, stated that the previous medic had to push more firmly to allow the normal saline to go through her IV.

## 2024-05-11 NOTE — Care Management (Signed)
 Transition of Care [facility]) - Inpatient Brief Assessment   Patient Details  Name: Vanessa Fernandez MRN: [id number] Date of Birth: Oct 21, 1925  [facility] CM/SW Contact:    Corean JAYSON Canary, RN Phone Number: 05/11/2024, 8:50 AM   Clinical Narrative: Patient transitioned to the hospital at home program last evening. She lives alone with son nearby for support. She is active with [facility] for home health. PT recommended continuing home health. Watching for oxygen need.   IPCM will follow   Transition of Care Asessment: Insurance and Status: Insurance coverage has been reviewed Patient has primary care physician: Yes Home environment has been reviewed: Lives alone son nearby Prior level of function:: Independent Prior/Current Home Services: Current home services (active with [facility]) Social Drivers of Health Review: SDOH reviewed needs interventions Readmission risk has been reviewed: Yes Transition of care needs: transition of care needs identified, TOC will continue to follow

## 2024-05-11 NOTE — Progress Notes (Addendum)
 Late this afternoon, patient seen for brief virtual visit during medic visit due to increased dyspnea and palpitations.  Patient reported, by the time of our call, she was feeling better.  She became short of breath walking across the home and back to her chair.  She reports feeling A-fib intermittently throughout the day.  HR when she was more symptomatic were reported 99-110 but improved as patient rested.  Pt denied any chest pain. She reports long history of A-fib and intermittent symptoms / palpitations.     Overall HR's have been controlled, not sustaining > 110. Known history of difficult to control A-fib on flecainide  for many years, s/p multiple cardioversions and ablation.  Pt reports symptoms typical for her baseline A-fib but more frequent today.   Suspect intermittent hypoxia and pneumonia are contributing factors.  Both TSH and free T4 are elevated as well.   PLAN -  --obtain EKG if HR sustaining > 110 bpm at rest --continue current medications --will discuss with patient tomorrow AM adjusting synthroid  dose down slightly  Time spent 15 min

## 2024-05-11 NOTE — Progress Notes (Signed)
 Pt seen for routine [facility] morning visit. Pt appears well but states she slept very poorly due to being disturbed through the night by our team due to low o2 readings. Pt's son is present.  Vital signs and assessment obtained as noted. Lung sounds were clear but diminished. Pt states she has a dry but congested cough. Pt states she gets nauseous if she takes mucinex  and robitussin has not worked for her. Heart tones are s1, s2 and pulse is irregular with a history of a-fib. Pt's abdomen is soft and non-tender in all quadrants with active bowel sounds. Pt has been off O2 for the past hour before my arrival and states it made her nose extremely dry and uncomfortable. Pt's son also feels like O2 tubing is a fall risk. Pt will use O2 when sleeping but try to remain on RA throughout the day.   Medications administered as noted including duoneb treatment. Pt used I/S after neb treatment.   Pt had virtual visit with RN and MD.   IV care complete. Labs drawn as noted.  Pt encouraged to call back with any further problems or questions until second visit. Pt Is going to take a nap and will put O2 on at 1 LPM.

## 2024-05-11 NOTE — Progress Notes (Addendum)
 1941- Alarm Tachypnea for Respiratory Rate: RN introduction completed with Patient and Caregiver/Son. Plan for night medication administration reviewed, agrees to a video call close to 2045. Encouraged to call if needed, [facility] contact information reviewed.     2016--Alarm Hypoxia for SpO2: Patient SpO2 showing 87-88% across 4 current health readings but not alerting. Outreach call to confirm SpO2 reading of 89%, per patient and Son use of home pulse oximeter, reading is 97% and [place] with 3L is intact. Son reported patient reported she is doing ok, son reported patient is still coughing more than usual even with recent breathing treatment.     2041- Video Call-RN introduction, Patient identifiers completed.  Patient sitting up in chair with son/caregiver present. Patient A&O x4. Patient caregiver/son and patient confirmed all questions are answered. Medication administration completed for 4 PO and 1 for eye drops.  Patient denies pain or need for PRN medications.  Assessment completed. Fluctuation in SpO2 readings (mid 80s-90's) and HR discussed with patient and Son/caregiver, who confirmed the patients SpO2 is not accurate on current health monitor at times. Patients home pulse oximeter reading at 97%. HR also fluctuated between the mid 90s to low 100s.  Patient/son confirmed patient is asymptomatic for respiratory or cardiac distress and informed. Nonproductive coughing is better than with previous contact call. Virtual RN will continue to monitor patient overnight; both encouraged to call as needed. [facility] contact information reviewed. Patient and Caregiver/Son request to have home medication assistance for both AM and PM visits.     2058--Inbound call received from Caregiver/Son who reported having issues with seeing option to call for help on the tablet. Caregiver/Son confirmed will power down and restart.

## 2024-05-12 LAB — BASIC METABOLIC PANEL WITH GFR
Anion gap: 11 (ref 5–15)
BUN: 16 mg/dL (ref 8–23)
CO2: 28 mmol/L (ref 22–32)
Calcium: 8.5 mg/dL — ABNORMAL LOW (ref 8.9–10.3)
Chloride: 97 mmol/L — ABNORMAL LOW (ref 98–111)
Creatinine, Ser: 1.18 mg/dL — ABNORMAL HIGH (ref 0.44–1.00)
GFR, Estimated: 42 mL/min — ABNORMAL LOW (ref >60)
Glucose, Bld: 125 mg/dL — ABNORMAL HIGH (ref 70–99)
Potassium: 3.9 mmol/L (ref 3.5–5.1)
Sodium: 136 mmol/L (ref 135–145)

## 2024-05-12 LAB — CBC
HCT: 29.6 % — ABNORMAL LOW (ref 36.0–46.0)
Hemoglobin: 9.7 g/dL — ABNORMAL LOW (ref 12.0–15.0)
MCH: 30.5 pg (ref 26.0–34.0)
MCHC: 32.8 g/dL (ref 30.0–36.0)
MCV: 93.1 fL (ref 80.0–100.0)
Platelets: 296 K/uL (ref 150–400)
RBC: 3.18 MIL/uL — ABNORMAL LOW (ref 3.87–5.11)
RDW: 13.2 % (ref 11.5–15.5)
WBC: 7.2 K/uL (ref 4.0–10.5)
nRBC: 0 % (ref 0.0–0.2)

## 2024-05-12 MED ORDER — LEVOTHYROXINE SODIUM 100 MCG PO TABS
100.0000 ug | ORAL_TABLET | Freq: Every day | ORAL | Status: DC
  Administered 2024-05-13 – 2024-05-14 (×2): 100 ug via ORAL
  Filled 2024-05-12 (×3): qty 1

## 2024-05-12 MED ORDER — RISAQUAD PO CAPS
2.0000 | ORAL_CAPSULE | Freq: Every day | ORAL | Status: DC
  Administered 2024-05-13 – 2024-05-14 (×2): 2 via ORAL
  Filled 2024-05-12 (×3): qty 2

## 2024-05-12 MED ORDER — METOPROLOL TARTRATE 12.5 MG HALF TABLET
12.5000 mg | ORAL_TABLET | Freq: Two times a day (BID) | ORAL | Status: DC | PRN
  Filled 2024-05-12 (×3): qty 1

## 2024-05-12 NOTE — Progress Notes (Signed)
 Completed virtual rounds with MD,paramedic at patient bedside. POC reviewed and discussed ,patient voices understanding and agreement. Pt reminded to call RN for any needs, RN and MD available at all times. Pt voices understanding. Pt aware of next planned visit and next call from RN.

## 2024-05-12 NOTE — Progress Notes (Signed)
 Clinic Appointment  HN called to schedule hospital follow up at TCM clinic- son declines- states patient wants to see Dr. Lelon.  HN sent message to Dr. Jasper office to schedule.

## 2024-05-12 NOTE — Evaluation (Cosign Needed)
 Per Vanessa Fernandez's previous note:        SATURATION QUALIFICATIONS: (This note is used to comply with regulatory documentation for home oxygen)   Patient Saturations on Room Air at Rest = 97 %   Patient Saturations on Room Air while Ambulating = 90 % HR 140   Patient Saturations on 2L Liters of oxygen while Ambulating = at first  to 84%% HR 105 did resat to 92% at end of ambulation.    Please briefly explain why patient needs home oxygen: Patient Desaturation is consistently delayed and patient becomes functionally short of breath as evidenced by the inability to complete sentences complicated by CHF and AFIb.          Cosign added.

## 2024-05-12 NOTE — Evaluation (Signed)
 SATURATION QUALIFICATIONS: (This note is used to comply with regulatory documentation for home oxygen)  Patient Saturations on Room Air at Rest = 97 %  Patient Saturations on Room Air while Ambulating = 90 % HR 140  Patient Saturations on 2L Liters of oxygen while Ambulating = at first  to 84%% HR 105 did resat to 92% at end of ambulation.   Please briefly explain why patient needs home oxygen: Patient Desaturation is consistently delayed and patient becomes functionally short of breath as evidenced by the inability to complete sentences complicated by CHF and AFIb.

## 2024-05-12 NOTE — Progress Notes (Signed)
 Pt initiated virtual visit via ipad for med administration. Pt alert and oriented, states she slept well. Pt is not wearing her oxygen,states she  wears it as needed. Discussed plan for paramedic to do ambulating oxygen sats this am. Pt agreeable.

## 2024-05-12 NOTE — Progress Notes (Signed)
 Hospital [facility] Daily Progress Note   [name]  MRN: [id number]  DOB: Jun 06, 1925  DOA: 05/08/2024  DOS: the patient was seen and examined on @TODAY @    Patient identified themself as Vanessa Fernandez  DOB 11-02-1925  Medic Lauraine Faes present in the home during encounter and performed the assessment and physical exam.  Patient was seen today via video conference; my physical location [place].   Brief hospital course:  CC: cough, congestion HPI: Vanessa Fernandez is a 89 year old female with history of A-fib with sinus pause/bradycardia on Eliquis  and flecainide  with recent pacemaker placement on 04/22/2024 at [facility], hypothyroidism presented to [facility] ED for chief complaint shortness of breath, cough x 2 weeks.. Recently completed 2 weeks of antibiotics, doxycycline. Denied of having any fever or chills.     ED Evaluation: POA: Tachypneic to 30 initially.   SpO2 88% on room air and improved with 2 L [place].   BP 133/63, pulse 83, afebrile 98.2 F, RR (!) 22, SpO2 100% 2 L.   Patient was admitted for acute respiratory failure with hypoxia due to pneumonia and mild decompensation of diastolic CHF as outlined in detail below.   Assessment and Plan:  * SOB (shortness of breath) 05/09/2024 - Shortness of breath, dry cough, tachypnea -Elevated BNP <1400 chest x-ray consistent with some congestion, possible in lower lobes -Cannot rule out viral pneumonia, patient remained afebrile, mildly hypoxic with no leukocytosis -Influenza A/B SARS-CoV-2-all negative -Due to comorbidities, initiating empiric antibiotics PO Levaquin  for few days  Will Obtaining procalcitonin level, will de-escalate antibiotics accordingly - Continuing gentle diuresis with IV Lasix   05/10/2024 procalcitonin negative. Will get CT chest without contrast just to make sure she does not have a occult pneumonia but I doubt it. Stop levaquin  for now. UPDATE - CT chest shows LLL pneumonia. Start  IV rocephin /po zithromax .  12/14 -- pt clinically improving, but continues to have O2 desats into 80's during sleep and intermittently througout day 12/15 -- pt notes ongoing improvement in breathing, less cough this AM --Continue Rocephin / Zithromax  --Monitor volume status, off diuresis --Duonebs, antitussives & other supportive care per orders  LLL pneumonia 05/10/2024  CT chest shows LLL pneumonia. Pt has been on po doxycycline prior to admission but she still has a cough.   --Start IV Rocephin /po zithromax .  --Send Strep pneumo and legionella antigen.  --Will give her first dose of IV rocephin  in the hospital before she transfers to [facility].  12/14 -- continue IV Rocephin  and PO Zithromax .  Weaned to room air but had O2 desats overnight, having some brief dips in sats today on room air but improve above 90 per [facility] monitoring  12/15 - continue IV Rocephin  & PO Zithromax  - day 4 antibiotics  Acute diastolic CHF (congestive heart failure) (HCC) 05/09/2024 - It appears she may have HFpEF.   - Elevated proBNP 1418.0, with some congestions present on imaging -With underlying history of A-fib, falls, status post pacemaker placement -Continuing IV diuretics Lasix  -Monitoring I's and O's, daily weight  Echo from [facility] 12-20- 25,; reviewed EF 60-65%, mild LVH, mild MR, mild TR, aortic valve calcification, trivial pericardial effusion  05/10/2024 urine output about 1.5 liters yesterday. Pt feeling better. Pt had Pro-BNP on admission that was elevated and BNP today that was normal. Will check bnp tomorrow just so we can compare same lab tests. Given her rise of serum CO2, Bun and Scr, will change IV lasix  to 20 mg daily(next  dose tomorrow). She did get dose of IV lasix  20 mg daily.  05/11/24 -- Cr up a bit more today 1.26. --Stop further diuresis for now. DC Lasix .  05/12/24 - Diuresis was stopped.  Today labs pending - follow up renal function for  improvement    Hypomagnesemia 05/10/2024 Mg not repleted yesterday. Given hx of PAF. Will give 2 grams IV Mg today. Repeat Mg in AM. 12/14 - Mg 2.0 12/15 - No Mg level needed today --Monitor & replace Mg PRN  Hypokalemia 05/09/2024 give po kcl 40 meq 05/10/2024 K up to 3.5, will give another 40 meq kcl po x 1 today 12/14 - K 3.9 today, no Lasix  being given. 12/15 - labs pending for today --Repeat BMP tomorrow --Replace K as needed    Acute respiratory failure with hypoxia (HCC) 05/10/2024 RA sats 83% yesterday, placed on supplemental O2.  Pt not on home O2. Attributed to acute CHF, later found to have PNA as well.  12/14 - pt having O2 desats to mid-80's overnight. On room air this AM visit with sat 94% on room air 2 hours after removing O2 earlier 12/15 - on RA this AM.  Wore O2 overnight without hypoxia alarms on CH reported. --Mgmt of PNA and CHF as outlined --Pt to wear O2 when sleeping overnight & agreeable --Supplement O2 PRN to keep sats > 90%. --Continue to monitor.  --Check ambulatory O2 sats   Pt shared she did smoke when she was younger. Started at age 28. Smoked about 30 years. Quit in the 1960s.   Acquired hypothyroidism 05/09/2024 Continue Synthroid  --Check TSH  05/10/2024 TSH elevated at 11.247. add-on FT4 to today's labs. Continue synthroid  112 mcg for now.  12/14 - free T4 mildly elevated 1.20 (ref range 0.61--1.12).   --Given her reported increase A-fib symptoms, will reduce synthroid  slightly to 100 mcg --Recommend repeat thyroid studies with PCP in 6/-8 weeks   PAF (paroxysmal atrial fibrillation) (HCC) Follows with Cardiology at [facility]. History of A-fib, with pauses, status post dual-chamber pacemaker placementon 04/22/24-sutures were removed yesterday (05/08/2024)  05/10/2024 currently in NSR on telemetry.   12/14 -- this afternoon, pt had some increased palpitations, onset after walking across her home, resolved after resting. HR was 99-110 but  improved. HR overall has been controlled.  12/15 -- pt reports A-fib symptoms better this morning.   --PO Lopressor  12.5 mg BID PRN if HR sustaining > 110 bpm --EKG PRN if HR sustaining > 110 bpm --Continue home Eliquis  and flecainide   She just had PPM [facility] last [facility]. She can f/u with her cardiologist at [facility] after discharge.   GERD (gastroesophageal reflux disease) 12/15 -- continue Pepcid    Diverticulitis-resolved as of 05/10/2024 Recent history of diverticulitis, denies any abdominal pain -S/p recent treatment with doxycycline     Patient Active Problem List   Diagnosis Date Noted   LLL pneumonia 05/10/2024    Priority: High   SOB (shortness of breath) 05/09/2024    Priority: High   Acute diastolic CHF (congestive heart failure) (HCC) 05/08/2024    Priority: High   Acute respiratory failure with hypoxia (HCC) 05/10/2024    Priority: Medium    Hypokalemia 05/10/2024    Priority: Medium    Hypomagnesemia 05/10/2024    Priority: Medium    PAF (paroxysmal atrial fibrillation) (HCC) 03/28/2022    Priority: Low   Acquired hypothyroidism 03/28/2022    Priority: Low   GERD (gastroesophageal reflux disease)     Priority: Low  Depression       Significant Events: Admitted 05/08/2024 for SOB, acute diastolic CHF 05-10-2024 transferred to [facility] program    Subjective / Interval 24 hour History:  Pt seen virtually during AM medic visit today.  She reports feeling better overall, breathing is improving and coughing less this AM.  She states her A-fib symptoms are better today, denies palpitations.  Reports drinking more fluids.  Son was present during virtual visit and reports pt had some cough overnight, gave her Robitussin and it improved, pt was able to sleep until 830 this AM after that.  Overall, son reports pt looking better and acting more like herself.     Admission Labs: Covid,rsv, flu negative WBC 8.7, HgB 9.4, plt  345 Pro-BNP 1418 Na 139, K 3.3, CO2 of 29, BUN 9, Scr 0.82, glu 101 Mg 1.6 TSH 11.247 Procalcitonin <0.10  Admission Imaging Studies: CXR  Left lower lobe consolidation, which may reflect atelectasis or pneumonia. 2. Small bilateral pleural effusions, left greater than right. 3. Enlarged cardiac silhouette and pulmonary vascular congestion, without overt edema. 4. Dual lead pacemaker as above  Significant Labs: Cr trend: 0.82 >> 1.06 >> 1.16 >> 1.26 Hbg trend: 9.4 >> 9.0 >> 10.0 >> 9.7 Bicarb 29 >> 37 after diuresis >> 33 >> pending today  Significant Imaging Studies: CT chest w/o contrast 12/13 -- L>R lower lobe collapse/consolidation with small left and tiny right pleural effusions, RLL irregular opacity atelectasis vs scarring (repeat CT in 3 months to ensure resolution recommended)   Antibiotic Therapy: Anti-infectives (From admission, onward)    Start     Dose/Rate Route Frequency Ordered Stop   05/10/24 1215  cefTRIAXone  (ROCEPHIN ) 1 g in sodium chloride  0.9 % 100 mL IVPB        1 g 200 mL/hr over 30 Minutes Intravenous Every 24 hours 05/10/24 1128     05/10/24 1215  azithromycin  (ZITHROMAX ) tablet 500 mg        500 mg Oral Daily 05/10/24 1128 05/15/24 0959   05/09/24 1100  levofloxacin  (LEVAQUIN ) tablet 250 mg  Status:  Discontinued        250 mg Oral Daily 05/09/24 1009 05/10/24 1007       Procedures: none  Consultants: none    Physical Exam:    05/12/2024    9:00 AM 05/12/2024    5:48 AM 05/11/2024    4:45 PM  Vitals with BMI  Weight   173 lbs  BMI   30.65  Systolic 121  113  Diastolic 71  70  Pulse 105 88 899    General exam: awake, alert, no acute distress HEENT: voice clear, moist mucus membranes, mildly hard of hearing  Respiratory system: lung exam similar to yseterday - CTAB but diminished, no wheezes or rhonchi, normal respiratory effort at rest on room air. Cardiovascular system: normal S1/S2, irregular rhythm, regular rate, no edema,  pacemaker present.   Gastrointestinal system: soft, NT, ND, +bowel sounds. Central nervous system: A&O x 3. no gross focal neurologic deficits, normal speech Extremities: moves all, no edema Psychiatry: normal mood, congruent affect, judgement and insight appear normal    Data Reviewed:  Today's labs are pending Significant labs & studies as reviewed above  Family Communication: son present during AM visit today  Disposition: Status is: Inpatient Remains inpatient appropriate because: remains on IV antibiotics pending further improvement, ongoing o2 desaturations requiring supplemental O2   Planned Discharge Destination: Home Health    Time spent: 45 minutes  Author: Burnard DELENA Cunning, DO [facility]  01/17/2024 7:00 AM  For on call review [email or web address].

## 2024-05-12 NOTE — Progress Notes (Addendum)
 Pt seen for routine [facility] morning visit. Pt appears well and states she slept much better than yesterday. No complaints at this time. Pt's son is present.  IMM letter signed.   Vital signs and assessment obtained as noted. Pt's lungs are clear but diminished in all fields. Abdomen is soft and non-tender in all quadrants and Pt had 2 Bms yesterday and denies any issues with same. Pt's radial pulses are strong but irregular consistent with hx of a-fib. No edema noted to lower extremities. Heart tones are s1, s2. IV care completed including new curos cap.  Medications administered as noted.   Pt had virtual visit with RN and Dr. Fausto and plan of care discussed.  Labs drawn after 3 attempts. Pt has poor vasculature and samples drawn could potentially not be enough.   Ambulatory sats obtained. Please see note from Darice Ni, RN for results.  Pt prefers to not wear O2 if possible but I recommended for the remainder of the day that if Pt is awake and at rest, she could stay on RA. When ambulating or sleeping to place the O2 on again for now.   Pt encouraged to call back with any problems or questions and that we will be back this afternoon for second visit.

## 2024-05-13 ENCOUNTER — Other Ambulatory Visit: Payer: Self-pay

## 2024-05-13 LAB — BASIC METABOLIC PANEL WITH GFR
Anion gap: 10 (ref 5–15)
BUN: 13 mg/dL (ref 8–23)
CO2: 30 mmol/L (ref 22–32)
Calcium: 8.8 mg/dL — ABNORMAL LOW (ref 8.9–10.3)
Chloride: 100 mmol/L (ref 98–111)
Creatinine, Ser: 1.04 mg/dL — ABNORMAL HIGH (ref 0.44–1.00)
GFR, Estimated: 49 mL/min — ABNORMAL LOW (ref >60)
Glucose, Bld: 147 mg/dL — ABNORMAL HIGH (ref 70–99)
Potassium: 4.1 mmol/L (ref 3.5–5.1)
Sodium: 140 mmol/L (ref 135–145)

## 2024-05-13 LAB — LEGIONELLA PNEUMOPHILA SEROGP 1 UR AG
L. pneumophila Serogp 1 Ur Ag: NEGATIVE
Source of Sample: 41499

## 2024-05-13 LAB — MAGNESIUM: Magnesium: 1.8 mg/dL (ref 1.7–2.4)

## 2024-05-13 MED ORDER — BENZONATATE 100 MG PO CAPS: 100.0000 mg | ORAL_CAPSULE | Freq: Three times a day (TID) | ORAL | 0 refills | Status: AC | PRN

## 2024-05-13 MED ORDER — LEVOTHYROXINE SODIUM 100 MCG PO TABS: 100.0000 ug | ORAL_TABLET | Freq: Every day | ORAL | 1 refills | Status: AC

## 2024-05-13 MED ORDER — AMOXICILLIN-POT CLAVULANATE 875-125 MG PO TABS: 1.0000 | ORAL_TABLET | Freq: Two times a day (BID) | ORAL | 0 refills | Status: AC

## 2024-05-13 MED ORDER — RISAQUAD PO CAPS: 2.0000 | ORAL_CAPSULE | Freq: Every day | ORAL | 0 refills | Status: AC

## 2024-05-13 MED ORDER — FAMOTIDINE 20 MG PO TABS: 20.0000 mg | ORAL_TABLET | Freq: Two times a day (BID) | ORAL | Status: AC

## 2024-05-13 MED ORDER — DOCUSATE SODIUM 100 MG PO CAPS: 100.0000 mg | ORAL_CAPSULE | Freq: Two times a day (BID) | ORAL | 0 refills | Status: AC | PRN

## 2024-05-13 MED ORDER — LEVALBUTEROL TARTRATE 45 MCG/ACT IN AERO: 2.0000 | INHALATION_SPRAY | Freq: Four times a day (QID) | RESPIRATORY_TRACT | 2 refills | Status: AC | PRN

## 2024-05-13 MED ORDER — FAMOTIDINE 20 MG PO TABS
20.0000 mg | ORAL_TABLET | Freq: Two times a day (BID) | ORAL | Status: DC
  Administered 2024-05-13 – 2024-05-14 (×3): 20 mg via ORAL
  Filled 2024-05-13 (×5): qty 1

## 2024-05-13 MED ORDER — METOPROLOL TARTRATE 25 MG PO TABS: 12.5000 mg | ORAL_TABLET | Freq: Two times a day (BID) | ORAL | 0 refills | Status: AC

## 2024-05-13 MED ORDER — METOPROLOL TARTRATE 12.5 MG HALF TABLET
12.5000 mg | ORAL_TABLET | Freq: Two times a day (BID) | ORAL | Status: DC
  Administered 2024-05-13 – 2024-05-14 (×3): 12.5 mg via ORAL
  Filled 2024-05-13 (×5): qty 1

## 2024-05-13 NOTE — Progress Notes (Addendum)
 Vanessa Fernandez is wanting to go to bed early. Early virtual visit. She is in no distress. She has no c/o pain. HR is low 100s without palpitations. She has schedule flecainide  ordered and administered. She has some loose stools and requesting imodium for tomorrow. Vanessa Fernandez has some indigestion tonight and states that she takes her pepcid  2x a day.

## 2024-05-13 NOTE — Sepsis Progress Note (Signed)
 Better HR control through the night. Currently 70-80s.

## 2024-05-13 NOTE — Progress Notes (Signed)
 Patient appears comfortable on the couch. Aox4. Denies pain. Room air. Denies SOB. Patient reports loose stools yesterday. Plan of care reviewed with patient.

## 2024-05-13 NOTE — Progress Notes (Signed)
 Occupational Therapy Treatment Patient Details Name: Vanessa Fernandez MRN: [id number] DOB: 29-Dec-1925 Today's Date: 05/13/2024   History of present illness 89 y.o. female admitted 05/08/24 with 2-wk h/o SOB and cough. Workup for suspected CHF. Of note, recent PPM placement 04/22/24. Other PMH includes afib with sinus pause/bradycardia on Eliquis , GERD.   OT comments  H@H  virtual visit with RN present.  Pt is making excellent progress towards their acute OT goals. She reports being mod I for toileting, hygiene, dressing and grooming with use of SPC. Her family has been assisting with meals and bathing. Reviewed some home safety and fall prevention education, pt recalled great safety awareness. She also has great awareness of PPM precautions. Pt continues to be limited by activity tolerance and BLE weakness. Per pt she is to wear 2L at night. OT to continue to follow acutely to facilitate progress towards established goals. Pt will continue to benefit from no follow up OT.       If plan is discharge home, recommend the following:  A little help with walking and/or transfers;A little help with bathing/dressing/bathroom;Assistance with cooking/housework   Equipment Recommendations  None recommended by OT       Precautions / Restrictions Precautions Precautions: Fall;ICD/Pacemaker Recall of Precautions/Restrictions: Intact Precaution/Restrictions Comments: good awareness of pacemaker precautions Restrictions Weight Bearing Restrictions Per Provider Order: No Other Position/Activity Restrictions: PPM              ADL either performed or assessed with clinical judgement   ADL Overall ADL's : Needs assistance/impaired             General ADL Comments: pt states she has been managing toileting with mod I and use use of her SPC, family has been managing meals, she is dressing with mod I and recalled good awareness of PPM precautions for UB dressing    Extremity/Trunk Assessment  Upper Extremity Assessment Upper Extremity Assessment: Generalized weakness   Lower Extremity Assessment Lower Extremity Assessment: Defer to PT evaluation        Vision   Vision Assessment?: No apparent visual deficits   Perception Perception Perception: Within Functional Limits   Praxis Praxis Praxis: WFL   Communication Communication Communication: Impaired Factors Affecting Communication: Hearing impaired   Cognition Arousal: Alert Behavior During Therapy: WFL for tasks assessed/performed Cognition: No apparent impairments             OT - Cognition Comments: good recall of PPM precautions                 Following commands: Intact        Cueing   Cueing Techniques: Verbal cues        General Comments VSS - virtual visit    Pertinent Vitals/ Pain       Pain Assessment Pain Assessment: No/denies pain   Frequency  Min 2X/week        Progress Toward Goals  OT Goals(current goals can now be found in the care plan section)     Acute Rehab OT Goals Patient Stated Goal: to get better OT Goal Formulation: With patient Time For Goal Achievement: 05/23/24 Potential to Achieve Goals: Good ADL Goals Pt Will Perform Grooming: with modified independence;standing Pt Will Perform Lower Body Dressing: with modified independence;sit to/from stand Pt Will Transfer to Toilet: with modified independence;ambulating;regular height toilet Pt Will Perform Toileting - Clothing Manipulation and hygiene: with modified independence;sit to/from stand Additional ADL Goal #1: Pt will verbalize at least 3 energy conservation strategies for ADLs  Plan      AM-PAC OT 6 Clicks Daily Activity     Outcome Measure   Help from another person eating meals?: None Help from another person taking care of personal grooming?: None Help from another person toileting, which includes using toliet, bedpan, or urinal?: None Help from another person bathing (including washing,  rinsing, drying)?: A Little Help from another person to put on and taking off regular upper body clothing?: None Help from another person to put on and taking off regular lower body clothing?: None 6 Click Score: 23    End of Session    OT Visit Diagnosis: Unsteadiness on feet (R26.81);Other abnormalities of gait and mobility (R26.89);Muscle weakness (generalized) (M62.81)   Activity Tolerance Patient tolerated treatment well   Patient Left     Nurse Communication Mobility status        Time: 8384-8364 OT Time Calculation (min): 20 min  Charges: OT General Charges $OT Visit: 1 Visit OT Treatments $Self Care/Home Management : 8-22 mins  [name], OTR/L Acute Rehabilitation Services Office [phone] Secure Chat Communication Preferred   [name] 05/13/2024, 5:08 PM

## 2024-05-13 NOTE — Discharge Summary (Incomplete)
 [facility] Physician Discharge Summary   [name] MRN: [id number] DOB: 24-May-1926  Admit date:     05/08/2024  Discharge date: 05/14/2024  Discharge Physician: Burnard DELENA Cunning   PCP: Lelon Norleen ORN., MD   Patient identified themself as Vanessa Fernandez  DOB [birth year]  Medic *** present in the home during encounter and performed the physical exam and assessment.  Patient was seen today via video chat; my physical location ***   Recommendations at discharge:  {Tip this will not be part of the note when signed- Example include specific recommendations for outpatient follow-up, pending tests to follow-up on. (Optional):26781}  Follow up with Primary Care in 1-2 weeks Follow up with Cardiology as schedule, or call if needed for questions/concerns for A-fib or Pacemaker Repeat CBC, BMP, Mg in 1-2 weeks Low dose metoprolol  was started for additional HR control in the setting of PNA and hypoxia triggering episodes of RVR Follow up on recovery from pneumonia Reassess need for home oxygen at follow up. At time of discharge, pt using 2 L O2 during sleep and with ambulation  Discharge Diagnoses: Principal Problem:   SOB (shortness of breath) Active Problems:   Acute diastolic CHF (congestive heart failure) (HCC)   LLL pneumonia   Acute respiratory failure with hypoxia (HCC)   Hypokalemia   Hypomagnesemia   GERD (gastroesophageal reflux disease)   PAF (paroxysmal atrial fibrillation) (HCC)   Acquired hypothyroidism  Resolved Problems:   * No resolved hospital problems. [facility] Course: CC: cough, congestion HPI: Vanessa Fernandez is a 89 year old female with history of A-fib with sinus pause/bradycardia on Eliquis  and flecainide  with recent pacemaker placement on 04/22/2024 at [facility], hypothyroidism presented to [facility] ED for chief complaint shortness of breath, cough x 2 weeks.. Recently completed 2 weeks of antibiotics, doxycycline. Denied of  having any fever or chills.     ED Evaluation: POA: Tachypneic to 30 initially.   SpO2 88% on room air and improved with 2 L [place].   BP 133/63, pulse 83, afebrile 98.2 F, RR (!) 22, SpO2 100% 2 L.   Patient was admitted for acute respiratory failure with hypoxia due to pneumonia and mild decompensation of diastolic CHF as outlined in detail below.  Significant Events: Admitted 05/08/2024 for SOB, acute diastolic CHF 05-10-2024 transferred to [facility] program 12/14-15 -intermittently hypoxic, continues to require O2.  A-fib with RVR intermittently. 12/16 -low-dose metoprolol  added  Admission Labs: Covid,rsv, flu negative WBC 8.7, HgB 9.4, plt 345 Pro-BNP 1418 Na 139, K 3.3, CO2 of 29, BUN 9, Scr 0.82, glu 101 Mg 1.6 TSH 11.247 Procalcitonin <0.10  Admission Imaging Studies: CXR  Left lower lobe consolidation, which may reflect atelectasis or pneumonia. 2. Small bilateral pleural effusions, left greater than right. 3. Enlarged cardiac silhouette and pulmonary vascular congestion, without overt edema. 4. Dual lead pacemaker as above  Significant Labs: Cr trend: 0.82 >> 1.06 >> 1.16 >> 1.26 >> 1.18 >> 0.04 Hbg trend: 9.4 >> 9.0 >> 10.0 >> 9.7 (12/15) Bicarb 29 >> 37 after diuresis >> 33 >> normalized 28  Significant Imaging Studies: CT chest w/o contrast 12/13 -- L>R lower lobe collapse/consolidation with small left and tiny right pleural effusions, RLL irregular opacity atelectasis vs scarring (repeat CT in 3 months to ensure resolution recommended)  Assessment and Plan: * SOB (shortness of breath) 05/09/2024 - Shortness of breath, dry cough, tachypnea -Elevated BNP <1400 chest x-ray consistent with some congestion, possible in lower  lobes -Cannot rule out viral pneumonia, patient remained afebrile, mildly hypoxic with no leukocytosis -Influenza A/B SARS-CoV-2-all negative -Due to comorbidities, initiating empiric antibiotics PO Levaquin  for few days  Will Obtaining  procalcitonin level, will de-escalate antibiotics accordingly - Continuing gentle diuresis with IV Lasix   05/10/2024 procalcitonin negative. Will get CT chest without contrast just to make sure she does not have a occult pneumonia but I doubt it. Stop levaquin  for now. UPDATE - CT chest shows LLL pneumonia. Start IV rocephin /po zithromax .  12/14 -- pt clinically improving, but continues to have O2 desats into 80's during sleep and intermittently througout day 12/15 -- pt notes ongoing improvement in breathing, less cough this AM 12/16 - improving  --PNA mgmt as outlined --Off diuresis --Monitor volume status, off diuresis --Duonebs, antitussives & other supportive care per orders  LLL pneumonia 05/10/2024  CT chest shows LLL pneumonia. Pt has been on po doxycycline prior to admission but she still has a cough.   --Start IV Rocephin /po zithromax .  --Send Strep pneumo and legionella antigen.  --Will give her first dose of IV rocephin  in the hospital before she transfers to [facility]. 12/14 -- continue IV Rocephin  and PO Zithromax .  Weaned to room air but had O2 desats overnight, having some brief dips in sats today on room air but improve above 90 per [facility] monitoring 12/15 - continue IV Rocephin  & PO Zithromax  - day 4 antibiotics 12/16 - day 5 antibiotics, completing PO Zithromax .  IV Rocephin  today and transition to PO tomorrow to complete 7-10 day course  Acute diastolic CHF (congestive heart failure) (HCC) 05/09/2024 - It appears she may have HFpEF.   - Elevated proBNP 1418.0, with some congestions present on imaging -With underlying history of A-fib, falls, status post pacemaker placement -Continuing IV diuretics Lasix  -Monitoring I's and O's, daily weight  Echo from [facility] 12-20- 25,; reviewed EF 60-65%, mild LVH, mild MR, mild TR, aortic valve calcification, trivial pericardial effusion  05/10/2024 urine output about 1.5 liters yesterday. Pt feeling better. Pt had Pro-BNP on  admission that was elevated and BNP today that was normal. Will check bnp tomorrow just so we can compare same lab tests. Given her rise of serum CO2, Bun and Scr, will change IV lasix  to 20 mg daily(next dose tomorrow). She did get dose of IV lasix  20 mg daily.  05/11/24 -- Cr up a bit more today 1.26. --Stop further diuresis for now. DC Lasix .  12/15>>16/25 - Off diuresis & renal function improving   Hypomagnesemia 05/10/2024 Mg not repleted yesterday. Given hx of PAF. Will give 2 grams IV Mg today. Repeat Mg in AM. 12/14 - Mg 2.0 12/15 - No Mg level needed today 12/16 - mg 1.8 --Monitor & replace Mg PRN  Hypokalemia 05/09/2024 give po kcl 40 meq 05/10/2024 K up to 3.5, will give another 40 meq kcl po x 1 today 12/14 - K 3.9 today, no Lasix  being given. 12/15 - K 3.9 12/16 - K 4.1 --Repeat BMP tomorrow --Replace K as needed    Acute respiratory failure with hypoxia (HCC) 05/10/2024 RA sats 83% yesterday, placed on supplemental O2.  Pt not on home O2. Attributed to acute CHF, later found to have PNA as well.  12/14 - pt having O2 desats to mid-80's overnight. On room air this AM visit with sat 94% on room air 2 hours after removing O2 earlier 12/15 - on RA this AM.  Wore O2 overnight without hypoxia alarms on CH reported. 12/16 -  on RA this AM with sats 92-93% at rest. Wore O2 overnight, A-fib HR's more stable --Mgmt of PNA and CHF as outlined --Pt to wear O2 when sleeping overnight & agreeable --Supplement O2 PRN to keep sats > 90%. --Continue to monitor.  --Pt qualified for home O2, TOC following  Pt shared she did smoke when she was younger. Started at age 57. Smoked about 30 years. Quit in the 1960s.   Acquired hypothyroidism 05/09/2024 Continue Synthroid  --Check TSH 05/10/2024 TSH elevated at 11.247. add-on FT4 to today's labs. Continue synthroid  112 mcg for now. 12/14 - free T4 mildly elevated 1.20 (ref range 0.61--1.12).   --Given her reported increase A-fib  symptoms, will reduce synthroid  slightly to 100 mcg --Recommend repeat thyroid studies with PCP in 6/-8 weeks   PAF (paroxysmal atrial fibrillation) (HCC) Follows with Cardiology at [facility]. History of A-fib, with pauses, status post dual-chamber pacemaker placementon 04/22/24-sutures were removed yesterday (05/08/2024)  05/10/2024 currently in NSR on telemetry.   12/14 -- this afternoon, pt had some increased palpitations, onset after walking across her home, resolved after resting. HR was 99-110 but improved. HR overall has been controlled.  12/15 -- pt reports A-fib symptoms better this morning.   --PO Lopressor  12.5 mg BID PRN if HR sustaining > 110 bpm --EKG PRN if HR sustaining > 110 bpm --Continue home Eliquis  and flecainide  12/15 PM visit HR in 120-130's at rest with O2 90% on room air.  12/16 -- will start low dose scheduled metoprolol  12.5 mg BID (with BP hold parameters) --Message sent to patient's EP at [facility] Dr. Epifanio to update him at pt's request  She just had PPM placed last month at [facility]. She can f/u with her cardiologist at [facility] after discharge.   GERD (gastroesophageal reflux disease) 12/16 -- continue Pepcid    Diverticulitis-resolved as of 05/10/2024 Recent history of diverticulitis, denies any abdominal pain -S/p recent treatment with doxycycline      {Tip this will not be part of the note when signed Body mass index is 30.65 kg/m. , ,  (Optional):26781}  {(NOTE) Pain control PDMP Statment (Optional):26782} Consultants: *** Procedures performed: ***  Disposition: {Plan; Disposition:26390} Diet recommendation:  {Diet_Plan:26776} DISCHARGE MEDICATION: Allergies as of 05/13/2024       Reactions   Fentanyl  Shortness Of Breath   Asa [aspirin] Other (See Comments)   GI upset   Bextra [valdecoxib] Other (See Comments)   GI upset   Celebrex [celecoxib] Other (See Comments)   GI upset   Codeine Nausea And Vomiting   Motrin  [ibuprofen] Other (See Comments)   GI upset   Naprosyn [naproxen] Other (See Comments)   GI upset   Sulfa Antibiotics Rash   hands blister        Medication List     STOP taking these medications    colchicine 0.6 MG tablet   cromolyn 5.2 MG/ACT nasal spray Commonly known as: NASALCROM   lidocaine  5 % Commonly known as: Lidoderm    pantoprazole  40 MG tablet Commonly known as: PROTONIX        TAKE these medications    acetaminophen  500 MG tablet Commonly known as: TYLENOL  Take 1,000 mg by mouth every 6 (six) hours as needed for mild pain (pain score 1-3). For pain   acidophilus Caps capsule Take 2 capsules by mouth daily for 14 days. Start taking on: May 14, 2024   amoxicillin -clavulanate 875-125 MG tablet Commonly known as: AUGMENTIN  Take 1 tablet by mouth 2 (two) times daily for 5 days.  apixaban  5 MG Tabs tablet Commonly known as: ELIQUIS  Take 5 mg by mouth 2 (two) times daily.   benzonatate  100 MG capsule Commonly known as: TESSALON  Take 1 capsule (100 mg total) by mouth 3 (three) times daily as needed for cough.   clorazepate  7.5 MG tablet Commonly known as: TRANXENE  Take 7.5 mg by mouth at bedtime as needed for sleep.   docusate sodium  100 MG capsule Commonly known as: COLACE Take 1 capsule (100 mg total) by mouth 2 (two) times daily as needed for mild constipation. What changed:  when to take this reasons to take this   famotidine  20 MG tablet Commonly known as: PEPCID  Take 1 tablet (20 mg total) by mouth 2 (two) times daily. What changed: when to take this   flecainide  50 MG tablet Commonly known as: TAMBOCOR  Take 50 mg by mouth 2 (two) times daily.   guaiFENesin -dextromethorphan 100-10 MG/5ML syrup Commonly known as: ROBITUSSIN DM Take 5 mLs by mouth 3 (three) times daily as needed for cough.   levalbuterol  45 MCG/ACT inhaler Commonly known as: XOPENEX  HFA Inhale 2 puffs into the lungs every 6 (six) hours as needed for  wheezing or shortness of breath.   levothyroxine  100 MCG tablet Commonly known as: SYNTHROID  Take 1 tablet (100 mcg total) by mouth daily at 6 (six) AM. Start taking on: May 14, 2024 What changed:  medication strength how much to take when to take this   magnesium  oxide 400 MG tablet Commonly known as: MAG-OX Take 400 mg by mouth daily.   metoprolol  tartrate 25 MG tablet Commonly known as: LOPRESSOR  Take 0.5 tablets (12.5 mg total) by mouth 2 (two) times daily.   Multi-Vitamin tablet Take 1 tablet by mouth daily.   ondansetron  8 MG disintegrating tablet Commonly known as: ZOFRAN -ODT Take 1 tablet (8 mg total) by mouth every 8 (eight) hours as needed for nausea or vomiting.   VITAMIN B-12 PO Take 1 tablet by mouth daily.   Vitamin D3 125 MCG (5000 UT) Tabs Take 5,000 Units by mouth daily.   Xalatan  0.005 % ophthalmic solution Generic drug: latanoprost  Place 1 drop into both eyes at bedtime.       ASK your doctor about these medications    clorazepate  7.5 MG tablet Commonly known as: Tranxene -T Take 1 tablet (7.5 mg total) by mouth at bedtime as needed for up to 2 days for sleep. Ask about: Should I take this medication?               Durable Medical Equipment  (From admission, onward)           Start     Ordered   05/12/24 1703  For home use only DME oxygen  Once       Question Answer Comment  Length of Need 6 Months   Mode or (Route) Nasal cannula   Liters per Minute 2   Frequency Continuous (stationary and portable oxygen unit needed)   Oxygen delivery system: Gas      05/12/24 1702            Follow-up Information     [facility]) Follow up.   Specialty: Home Health Services Why: resume PT OT Contact information: [address] [place]  [phone]               Discharge Exam: Fredricka Weights   05/10/24 0534 05/11/24 1000 05/11/24 1645   Weight: 77.7 kg 78.1  kg 78.5 kg   ***  Condition at discharge: {DC Condition:26389}  The results of significant diagnostics from this hospitalization (including imaging, microbiology, ancillary and laboratory) are listed below for reference.   Imaging Studies: CT CHEST WO CONTRAST Result Date: 05/10/2024 CLINICAL DATA:  Shortness of breath and cough. EXAM: CT CHEST WITHOUT CONTRAST TECHNIQUE: Multidetector CT imaging of the chest was performed following the standard protocol without IV contrast. RADIATION DOSE REDUCTION: This exam was performed according to the departmental dose-optimization program which includes automated exposure control, adjustment of the mA and/or kV according to patient size and/or use of iterative reconstruction technique. COMPARISON:  12/07/2023 FINDINGS: Cardiovascular: The heart is enlarged. Small pericardial effusion is new in the interval. Coronary artery calcification is evident. Mild atherosclerotic calcification is noted in the wall of the thoracic aorta. Left-sided permanent pacemaker noted. Mediastinum/Nodes: No mediastinal lymphadenopathy. No evidence for gross hilar lymphadenopathy although assessment is limited by the lack of intravenous contrast on the current study. The esophagus has normal imaging features. There is no axillary lymphadenopathy. Lungs/Pleura: 12 mm irregular opacity in the right lower lobe (64/4) may reflect atelectasis or scarring. There is left greater than right lower lobe collapse/consolidation with small left and tiny right pleural effusions. 2 mm left upper lobe pulmonary nodule is stable in the interval, likely benign. Upper Abdomen: Visualized portion of the upper abdomen shows no acute findings. Musculoskeletal: No worrisome lytic or sclerotic osseous abnormality. IMPRESSION: 1. Left greater than right lower lobe collapse/consolidation with small left and tiny right pleural effusions. 2. 12 mm irregular opacity in the right lower lobe may  reflect atelectasis or scarring. Follow-up CT chest in 3 months recommended to ensure resolution. 3. Small pericardial effusion, new in the interval. 4.  Aortic Atherosclerosis (ICD10-I70.0). Electronically Signed   By: Camellia Candle M.D.   On: 05/10/2024 11:32   DG Chest 2 View Result Date: 05/08/2024 CLINICAL DATA:  Dry cough and congestion for 1 week, recent pacemaker placement EXAM: CHEST - 2 VIEW COMPARISON:  02/09/2024 FINDINGS: Frontal and lateral views of the chest demonstrate dual lead pacer within the left anterior chest, proximal lead in the region of the right atrium and distal lead in the region of the right ventricle. Mild enlargement of the cardiac silhouette. Pulmonary vascular congestion without overt edema. There is left lower lobe consolidation along the posterior costophrenic angle, with small left pleural effusion noted. Trace right pleural effusion. No pneumothorax. No acute bony abnormalities. IMPRESSION: 1. Left lower lobe consolidation, which may reflect atelectasis or pneumonia. 2. Small bilateral pleural effusions, left greater than right. 3. Enlarged cardiac silhouette and pulmonary vascular congestion, without overt edema. 4. Dual lead pacemaker as above. Electronically Signed   By: Ozell Daring M.D.   On: 05/08/2024 18:01    Microbiology: Results for orders placed or performed during the hospital encounter of 05/08/24  Resp panel by RT-PCR (RSV, Flu A&B, Covid) Anterior Nasal Swab     Status: None   Collection Time: 05/08/24  4:54 PM   Specimen: Anterior Nasal Swab  Result Value Ref Range Status   SARS Coronavirus 2 by RT PCR NEGATIVE NEGATIVE Final    Comment: (NOTE) SARS-CoV-2 target nucleic acids are NOT DETECTED.  The SARS-CoV-2 RNA is generally detectable in upper respiratory specimens during the acute phase of infection. The lowest concentration of SARS-CoV-2 viral copies this assay can detect is 138 copies/mL. A negative result does not preclude  SARS-Cov-2 infection and should not be used as the sole basis for  treatment or other patient management decisions. A negative result may occur with  improper specimen collection/handling, submission of specimen other than nasopharyngeal swab, presence of viral mutation(s) within the areas targeted by this assay, and inadequate number of viral copies(<138 copies/mL). A negative result must be combined with clinical observations, patient history, and epidemiological information. The expected result is Negative.  Fact Sheet for Patients:  bloggercourse.com  Fact Sheet for Healthcare Providers:  seriousbroker.it  This test is no t yet approved or cleared by the [place]  FDA and  has been authorized for detection and/or diagnosis of SARS-CoV-2 by FDA under an Emergency Use Authorization (EUA). This EUA will remain  in effect (meaning this test can be used) for the duration of the COVID-19 declaration under Section 564(b)(1) of the Act, 21 U.S.C.section 360bbb-3(b)(1), unless the authorization is terminated  or revoked sooner.       Influenza A by PCR NEGATIVE NEGATIVE Final   Influenza B by PCR NEGATIVE NEGATIVE Final    Comment: (NOTE) The Xpert Xpress SARS-CoV-2/FLU/RSV plus assay is intended as an aid in the diagnosis of influenza from Nasopharyngeal swab specimens and should not be used as a sole basis for treatment. Nasal washings and aspirates are unacceptable for Xpert Xpress SARS-CoV-2/FLU/RSV testing.  Fact Sheet for Patients: bloggercourse.com  Fact Sheet for Healthcare Providers: seriousbroker.it  This test is not yet approved or cleared by the [place]  FDA and has been authorized for detection and/or diagnosis of SARS-CoV-2 by FDA under an Emergency Use Authorization (EUA). This EUA will remain in effect (meaning this test can be used) for the duration of  the COVID-19 declaration under Section 564(b)(1) of the Act, 21 U.S.C. section 360bbb-3(b)(1), unless the authorization is terminated or revoked.     Resp Syncytial Virus by PCR NEGATIVE NEGATIVE Final    Comment: (NOTE) Fact Sheet for Patients: bloggercourse.com  Fact Sheet for Healthcare Providers: seriousbroker.it  This test is not yet approved or cleared by the [place]  FDA and has been authorized for detection and/or diagnosis of SARS-CoV-2 by FDA under an Emergency Use Authorization (EUA). This EUA will remain in effect (meaning this test can be used) for the duration of the COVID-19 declaration under Section 564(b)(1) of the Act, 21 U.S.C. section 360bbb-3(b)(1), unless the authorization is terminated or revoked.  Performed at [facility], [address] [place], [place] [address]     Labs: CBC: Recent Labs  Lab 05/08/24 1800 05/10/24 0235 05/11/24 1222 05/12/24 1109  WBC 8.7 8.5 10.2 7.2  NEUTROABS 5.5  --   --   --   HGB 9.4* 9.0* 10.0* 9.7*  HCT 29.1* 26.9* 31.6* 29.6*  MCV 91.8 92.1 93.2 93.1  PLT 345 315 362 296   Basic Metabolic Panel: Recent Labs  Lab 05/08/24 1800 05/09/24 1007 05/10/24 0235 05/11/24 1222 05/12/24 1109 05/13/24 1117  NA 139  --  137 135 136 140  K 3.3*  --  3.5 3.9 3.9 4.1  CL 99  --  94* 94* 97* 100  CO2 29  --  37* 33* 28 30  GLUCOSE 101*  --  107* 129* 125* 147*  BUN 9  --  18 22 16 13   CREATININE 0.82 1.06* 1.16* 1.26* 1.18* 1.04*  CALCIUM  8.5*  --  8.1* 8.5* 8.5* 8.8*  MG  --  1.6*  --  2.0  --  1.8  PHOS  --  3.4  --   --   --   --  Liver Function Tests: No results for input(s): AST, ALT, ALKPHOS, BILITOT, PROT, ALBUMIN in the last 168 hours. CBG: No results for input(s): GLUCAP in the last 168 hours.  Discharge time spent: {LESS THAN/GREATER UYJW:73611} 30 minutes.  Signed: Burnard DELENA Cunning, DO [facility] 05/13/2024

## 2024-05-13 NOTE — Progress Notes (Signed)
 IMM signed 05/13/24.

## 2024-05-13 NOTE — Progress Notes (Addendum)
 [facility] Daily Progress Note   [name]  MRN: [id number]  DOB: 08-Sep-1925  DOA: 05/08/2024  DOS: the patient was seen and examined on 05/13/2024     Patient identified themself as Vanessa Fernandez  DOB [birth year]  Medic Richerd Batty present in the home during encounter and performed the assessment and physical exam.  Patient was seen today via video conference; my physical location [facility] [place].   Brief hospital course:  CC: cough, congestion HPI: JASELYN NAHM is a 89 year old female with history of A-fib with sinus pause/bradycardia on Eliquis  and flecainide  with recent pacemaker placement on 04/22/2024 at [facility], hypothyroidism presented to [facility] ED for chief complaint shortness of breath, cough x 2 weeks.. Recently completed 2 weeks of antibiotics, doxycycline. Denied of having any fever or chills.     ED Evaluation: POA: Tachypneic to 30 initially.   SpO2 88% on room air and improved with 2 L .   BP 133/63, pulse 83, afebrile 98.2 F, RR (!) 22, SpO2 100% 2 L.   Patient was admitted for acute respiratory failure with hypoxia due to pneumonia and mild decompensation of diastolic CHF as outlined in detail below.  Significant Events: Admitted 05/08/2024 for SOB, acute diastolic CHF 05-10-2024 transferred to [facility] program 12/14-15 -intermittently hypoxic, continues to require O2.  A-fib with RVR intermittently. 12/16 -low-dose metoprolol  added  Admission Labs: Covid,rsv, flu negative WBC 8.7, HgB 9.4, plt 345 Pro-BNP 1418 Na 139, K 3.3, CO2 of 29, BUN 9, Scr 0.82, glu 101 Mg 1.6 TSH 11.247 Procalcitonin <0.10  Admission Imaging Studies: CXR  Left lower lobe consolidation, which may reflect atelectasis or pneumonia. 2. Small bilateral pleural effusions, left greater than right. 3. Enlarged cardiac silhouette and pulmonary vascular congestion, without overt edema. 4. Dual lead pacemaker as above  Significant Labs: Cr  trend: 0.82 >> 1.06 >> 1.16 >> 1.26 >> 1.18 >> 0.04 Hbg trend: 9.4 >> 9.0 >> 10.0 >> 9.7 (12/15) Bicarb 29 >> 37 after diuresis >> 33 >> normalized 28  Significant Imaging Studies: CT chest w/o contrast 12/13 -- L>R lower lobe collapse/consolidation with small left and tiny right pleural effusions, RLL irregular opacity atelectasis vs scarring (repeat CT in 3 months to ensure resolution recommended)     Subjective / Interval 24 hour History:  Pt seen virtually during AM medic visit today.  She reports sleeping very well last night, took the sleeping pill and feeling much better today.  She reports breathing feels better.  Wore oxygen all night.  No BM today but did have loose watery stool yesterday evening after being on scheduled Colace.  Patient reports A-fib symptoms upon waking up today and HR was 135, since has been in the 90s-110s and does not feel it is spiking as much as yesterday.    Assessment and Plan:  * SOB (shortness of breath) 05/09/2024 - Shortness of breath, dry cough, tachypnea -Elevated BNP <1400 chest x-ray consistent with some congestion, possible in lower lobes -Cannot rule out viral pneumonia, patient remained afebrile, mildly hypoxic with no leukocytosis -Influenza A/B SARS-CoV-2-all negative -Due to comorbidities, initiating empiric antibiotics PO Levaquin  for few days  Will Obtaining procalcitonin level, will de-escalate antibiotics accordingly - Continuing gentle diuresis with IV Lasix   05/10/2024 procalcitonin negative. Will get CT chest without contrast just to make sure she does not have a occult pneumonia but I doubt it. Stop levaquin  for now. UPDATE - CT chest shows LLL pneumonia. Start IV rocephin /po  zithromax .  12/14 -- pt clinically improving, but continues to have O2 desats into 80's during sleep and intermittently througout day 12/15 -- pt notes ongoing improvement in breathing, less cough this AM 12/16 - improving  --PNA mgmt as outlined --Off  diuresis --Monitor volume status, off diuresis --Duonebs, antitussives & other supportive care per orders  LLL pneumonia 05/10/2024  CT chest shows LLL pneumonia. Pt has been on po doxycycline prior to admission but she still has a cough.   --Start IV Rocephin /po zithromax .  --Send Strep pneumo and legionella antigen.  --Will give her first dose of IV rocephin  in the hospital before she transfers to [facility]. 12/14 -- continue IV Rocephin  and PO Zithromax .  Weaned to room air but had O2 desats overnight, having some brief dips in sats today on room air but improve above 90 per [facility] monitoring 12/15 - continue IV Rocephin  & PO Zithromax  - day 4 antibiotics 12/16 - day 5 antibiotics, completing PO Zithromax .  IV Rocephin  today and transition to PO tomorrow to complete 7-10 day course  Acute diastolic CHF (congestive heart failure) (HCC) 05/09/2024 - It appears she may have HFpEF.   - Elevated proBNP 1418.0, with some congestions present on imaging -With underlying history of A-fib, falls, status post pacemaker placement -Continuing IV diuretics Lasix  -Monitoring I's and O's, daily weight  Echo from [facility] 12-20- 25,; reviewed EF 60-65%, mild LVH, mild MR, mild TR, aortic valve calcification, trivial pericardial effusion  05/10/2024 urine output about 1.5 liters yesterday. Pt feeling better. Pt had Pro-BNP on admission that was elevated and BNP today that was normal. Will check bnp tomorrow just so we can compare same lab tests. Given her rise of serum CO2, Bun and Scr, will change IV lasix  to 20 mg daily(next dose tomorrow). She did get dose of IV lasix  20 mg daily.  05/11/24 -- Cr up a bit more today 1.26. --Stop further diuresis for now. DC Lasix .  12/15>>16/25 - Off diuresis & renal function improving   Hypomagnesemia 05/10/2024 Mg not repleted yesterday. Given hx of PAF. Will give 2 grams IV Mg today. Repeat Mg in AM. 12/14 - Mg 2.0 12/15 - No Mg level needed today 12/16 -  mg 1.8 --Monitor & replace Mg PRN  Hypokalemia 05/09/2024 give po kcl 40 meq 05/10/2024 K up to 3.5, will give another 40 meq kcl po x 1 today 12/14 - K 3.9 today, no Lasix  being given. 12/15 - K 3.9 12/16 - K 4.1 --Repeat BMP tomorrow --Replace K as needed    Acute respiratory failure with hypoxia (HCC) 05/10/2024 RA sats 83% yesterday, placed on supplemental O2.  Pt not on home O2. Attributed to acute CHF, later found to have PNA as well.  12/14 - pt having O2 desats to mid-80's overnight. On room air this AM visit with sat 94% on room air 2 hours after removing O2 earlier 12/15 - on RA this AM.  Wore O2 overnight without hypoxia alarms on CH reported. 12/16 - on RA this AM with sats 92-93% at rest. Wore O2 overnight, A-fib HR's more stable --Mgmt of PNA and CHF as outlined --Pt to wear O2 when sleeping overnight & agreeable --Supplement O2 PRN to keep sats > 90%. --Continue to monitor.  --Pt qualified for home O2, TOC following  Pt shared she did smoke when she was younger. Started at age 62. Smoked about 30 years. Quit in the 1960s.   Acquired hypothyroidism 05/09/2024 Continue Synthroid  --Check TSH 05/10/2024 TSH  elevated at 11.247. add-on FT4 to today's labs. Continue synthroid  112 mcg for now. 12/14 - free T4 mildly elevated 1.20 (ref range 0.61--1.12).   --Given her reported increase A-fib symptoms, will reduce synthroid  slightly to 100 mcg --Recommend repeat thyroid studies with PCP in 6/-8 weeks   PAF (paroxysmal atrial fibrillation) (HCC) Follows with Cardiology at [facility]. History of A-fib, with pauses, status post dual-chamber pacemaker placementon 04/22/24-sutures were removed yesterday (05/08/2024)  05/10/2024 currently in NSR on telemetry.   12/14 -- this afternoon, pt had some increased palpitations, onset after walking across her home, resolved after resting. HR was 99-110 but improved. HR overall has been controlled.  12/15 -- pt reports A-fib symptoms  better this morning.   --PO Lopressor  12.5 mg BID PRN if HR sustaining > 110 bpm --EKG PRN if HR sustaining > 110 bpm --Continue home Eliquis  and flecainide  12/15 PM visit HR in 120-130's at rest with O2 90% on room air.  12/16 -- will start low dose scheduled metoprolol  12.5 mg BID (with BP hold parameters) --Message sent to patient's EP at [facility] Dr. Epifanio to update him at pt's request  She just had PPM placed last month at [facility]. She can f/u with her cardiologist at [facility] after discharge.   GERD (gastroesophageal reflux disease) 12/16 -- continue Pepcid    Diverticulitis-resolved as of 05/10/2024 Recent history of diverticulitis, denies any abdominal pain -S/p recent treatment with doxycycline     Patient Active Problem List   Diagnosis Date Noted   LLL pneumonia 05/10/2024    Priority: High   SOB (shortness of breath) 05/09/2024    Priority: High   Acute diastolic CHF (congestive heart failure) (HCC) 05/08/2024    Priority: High   Acute respiratory failure with hypoxia (HCC) 05/10/2024    Priority: Medium    Hypokalemia 05/10/2024    Priority: Medium    Hypomagnesemia 05/10/2024    Priority: Medium    PAF (paroxysmal atrial fibrillation) (HCC) 03/28/2022    Priority: Low   Acquired hypothyroidism 03/28/2022    Priority: Low   GERD (gastroesophageal reflux disease)     Priority: Low   Depression        Antibiotic Therapy: Anti-infectives (From admission, onward)    Start     Dose/Rate Route Frequency Ordered Stop   05/10/24 1215  cefTRIAXone  (ROCEPHIN ) 1 g in sodium chloride  0.9 % 100 mL IVPB        1 g 200 mL/hr over 30 Minutes Intravenous Every 24 hours 05/10/24 1128     05/10/24 1215  azithromycin  (ZITHROMAX ) tablet 500 mg        500 mg Oral Daily 05/10/24 1128 05/15/24 0959   05/09/24 1100  levofloxacin  (LEVAQUIN ) tablet 250 mg  Status:  Discontinued        250 mg Oral Daily 05/09/24 1009 05/10/24 1007        Procedures: none  Consultants: none    Physical Exam:    05/13/2024   10:58 AM 05/12/2024    9:00 AM 05/12/2024    5:48 AM  Vitals with BMI  Systolic 148 121   Diastolic 72 71   Pulse 101 105 88    General exam: awake, alert, no acute distress HEENT: voice clear, moist mucus membranes, mildly hard of hearing  Respiratory system: Lungs overall clear bilaterally, SpO2 92-93% on room air, normal respiratory effort at rest Cardiovascular system: normal S1/S2, irregular rhythm, regular rate, no edema, pacemaker present.   Gastrointestinal system: soft, NT, ND, +bowel  sounds. Central nervous system: A&O x 3. no gross focal neurologic deficits, normal speech Extremities: moves all, no edema Psychiatry: normal mood, congruent affect, judgement and insight appear normal    Data Reviewed:  Today's labs are pending Significant labs & studies as reviewed above  Family Communication: son was updated by phone today   Disposition: Status is: Inpatient Remains inpatient appropriate because: remains on IV antibiotics pending further improvement, ongoing o2 desaturations requiring supplemental O2.  Closely monitoring HR's with intermittent A-fib RVR after addition of metoprolol     Planned Discharge Destination: Home Health    Time spent: 45 minutes  Author: Burnard DELENA Cunning, DO [facility]  01/17/2024 7:00 AM  For on call review [email or web address].

## 2024-05-13 NOTE — Progress Notes (Signed)
 Arrived to find the PT lying down on the couch, no obvious distress or trauma noted. PT is Aox4. She stated that she had felt great all day and did not have any current complaints. PT's son is present at the time.   PT was informed that she was expected to be d/c tomorrow. PT's son had questions about medications after she was d/c and they were discussed. PT's son stated that he had talked with DO Fausto earlier and felt comfortable with the decisions that were discussed.   Home O2 was delivered earlier in the day and PT received education on how to use it from the company.   Vitals were obtained and documented accordingly. No changes on the physical exam from this morning. PT denied any dizziness, light headedness, or weakness following the Metoprolol  administration this morning.   PT signed IMM letter. She was reminded to wear her O2 at night and that if she needed anything to reach out to the virtual RN.

## 2024-05-14 ENCOUNTER — Encounter (HOSPITAL_COMMUNITY): Payer: Self-pay | Admitting: Internal Medicine

## 2024-05-14 ENCOUNTER — Other Ambulatory Visit (HOSPITAL_COMMUNITY): Payer: Self-pay

## 2024-05-14 MED ORDER — METOPROLOL TARTRATE 25 MG PO TABS
12.5000 mg | ORAL_TABLET | Freq: Two times a day (BID) | ORAL | 2 refills | Status: AC
  Filled 2024-05-14: qty 30, 30d supply, fill #0

## 2024-05-14 NOTE — Progress Notes (Signed)
 Communicated with patient via video tablet. Patient appears comfortable on the couch. AVS paperwork given to the patient. Discharge instructions discussed with patient. Patients question answered to satisfaction. Patient agreeable with discharge plan.

## 2024-05-14 NOTE — Plan of Care (Signed)

## 2024-05-14 NOTE — Discharge Summary (Signed)
 Physician Discharge Summary   [name] MRN: [id number] DOB: 07-Jun-1925  Admit date:     05/08/2024  Discharge date: 05/14/2024  Discharge Physician: Dr. Sherre   PCP: Lelon Norleen ORN., MD   Recommendations at discharge:   New medication: metoprolol  tartrate 12.5 mg by mouth, twice daily has been prescribed on discharge.  Please followup with your cardiologist on July 23, 2024, or earlier. Please see your PCP within 4-6 weeks of discharge or earlier if able.   Discharge Diagnoses: Principal Problem:   SOB (shortness of breath) Active Problems:   Acute diastolic CHF (congestive heart failure) (HCC)   LLL pneumonia   Acute respiratory failure with hypoxia (HCC)   Hypokalemia   Hypomagnesemia   GERD (gastroesophageal reflux disease)   PAF (paroxysmal atrial fibrillation) (HCC)   Acquired hypothyroidism  Resolved Problems:   * No resolved hospital problems. [facility] Course:  Vanessa Fernandez is a 89 year old female with A-fib with sinus pause/bradycardia on Eliquis  and flecainide  with recent pacemaker placement on 04/22/2024 at [facility], hypothyroidism.  12/11: presented to [facility] ED for shortness of breath, cough x 2 weeks.. Recently completed 2 weeks of antibiotics, doxycycline. Denied of having any fever or chills.   Admission Labs: Covid,rsv, flu negative WBC 8.7, HgB 9.4, plt 345 Pro-BNP 1418 Na 139, K 3.3, CO2 of 29, BUN 9, Scr 0.82, glu 101 Mg 1.6 TSH 11.247 Procalcitonin <0.10  12/12: Pt admitted to hospitalist service for possible community-acquired pneumonia and acute heart failure exacerbation.  Patient was initiated on empiric antibiotic and gentle diuresis with IV furosemide .  12/13: Patient admitted to hospital at home program.  CT chest showed left lower lobe pneumonia ceftriaxone  and p.o. azithromycin . Levaquin  was discontinued.  Patient   Significant Events: Admitted 05/08/2024 for SOB, acute diastolic CHF 05-10-2024  transferred to [facility] program 12/14-15 -intermittently hypoxic, continues to require O2. A-fib with RVR intermittently. 12/16 -low-dose metoprolol  tartrate 12.5 mg BID added  12/16: I assumed care of the patient.  Overnight no reports.  Patient heart rate maintained as appropriate on new metoprolol  tartrate 12.5 mg p.o. twice daily. Plan for discharge today with augmentin  875/125 BID to complete 10 day course.  Assessment and Plan:  * SOB (shortness of breath) 05/09/2024 - Shortness of breath, dry cough, tachypnea -Elevated BNP <1400 chest x-ray consistent with some congestion, possible in lower lobes -Cannot rule out viral pneumonia, patient remained afebrile, mildly hypoxic with no leukocytosis -Influenza A/B SARS-CoV-2-all negative -Due to comorbidities, initiating empiric antibiotics PO Levaquin  for few days  Will Obtaining procalcitonin level, will de-escalate antibiotics accordingly - Continuing gentle diuresis with IV Lasix   05/10/2024 procalcitonin negative. Will get CT chest without contrast just to make sure she does not have a occult pneumonia but I doubt it. Stop levaquin  for now. UPDATE - CT chest shows LLL pneumonia. Start IV rocephin /po zithromax .  12/14 -- pt clinically improving, but continues to have O2 desats into 80's during sleep and intermittently througout day 12/15 -- pt notes ongoing improvement in breathing, less cough this AM 12/16 - improving  --PNA mgmt as outlined --Off diuresis --Monitor volume status, off diuresis --Duonebs, antitussives & other supportive care per orders  LLL pneumonia 05/10/2024  CT chest shows LLL pneumonia. Pt has been on po doxycycline prior to admission but she still has a cough.   --Start IV Rocephin /po zithromax .  --Send Strep pneumo and legionella antigen.  --Will give her first dose of IV rocephin  in the hospital before  she transfers to [facility]. 12/14 -- continue IV Rocephin  and PO Zithromax .  Weaned to room air but had  O2 desats overnight, having some brief dips in sats today on room air but improve above 90 per [facility] monitoring 12/15 - continue IV Rocephin  & PO Zithromax  - day 4 antibiotics 12/16 - day 5 antibiotics, completing PO Zithromax .  IV Rocephin  today and transition to PO tomorrow to complete 7-10 day course  12/17: patient completed 5 day course of abx. No antibiotics will be provided on discharge.  Acute diastolic CHF (congestive heart failure) (HCC) 05/09/2024 - It appears she may have HFpEF.   - Elevated proBNP 1418.0, with some congestions present on imaging -With underlying history of A-fib, falls, status post pacemaker placement -Continuing IV diuretics Lasix  -Monitoring I's and O's, daily weight  Echo from [facility] 12-20- 25,; reviewed EF 60-65%, mild LVH, mild MR, mild TR, aortic valve calcification, trivial pericardial effusion  05/10/2024 urine output about 1.5 liters yesterday. Pt feeling better. Pt had Pro-BNP on admission that was elevated and BNP today that was normal. Will check bnp tomorrow just so we can compare same lab tests. Given her rise of serum CO2, Bun and Scr, will change IV lasix  to 20 mg daily(next dose tomorrow). She did get dose of IV lasix  20 mg daily.  05/11/24 -- Cr up a bit more today 1.26. --Stop further diuresis for now. DC Lasix .  12/15>>16/25 - Off diuresis & renal function improving 12/17: reviewed sCr/eGFR from 12/16, showing 1.04/49, respectively. This may be patient's new baseline. Follow-up with PCP for ongoing monitor.  Hypomagnesemia 05/10/2024 Mg not repleted yesterday. Given hx of PAF. Will give 2 grams IV Mg today. Repeat Mg in AM. 12/14 - Mg 2.0 12/15 - No Mg level needed today 12/16 - mg 1.8  Hypokalemia 05/09/2024 give po kcl 40 meq 05/10/2024 K up to 3.5, will give another 40 meq kcl po x 1 today 12/14 - K 3.9 today, no Lasix  being given. 12/15 - K 3.9 12/16 - K 4.1 --Repeat BMP tomorrow --Replace K as needed  12/17:  reviewd labs from 12/16, hypokalemia, resolved.  Acute respiratory failure with hypoxia (HCC) 05/10/2024 RA sats 83% yesterday, placed on supplemental O2.  Pt not on home O2. Attributed to acute CHF, later found to have PNA as well.  12/14 - pt having O2 desats to mid-80's overnight. On room air this AM visit with sat 94% on room air 2 hours after removing O2 earlier 12/15 - on RA this AM.  Wore O2 overnight without hypoxia alarms on CH reported. 12/16 - on RA this AM with sats 92-93% at rest. Wore O2 overnight, A-fib HR's more stable --Mgmt of PNA and CHF as outlined --Pt to wear O2 when sleeping overnight & agreeable --Supplement O2 PRN to keep sats > 90%. --Continue to monitor.  --Pt qualified for home O2, TOC following  Pt shared she did smoke when she was younger. Started at age 63. Smoked about 30 years. Quit in the 1960s.  12/17: pt is on room air, no respiratory difficulty/distress while speaking.  Acquired hypothyroidism 05/09/2024 Continue Synthroid  100 mcg dialy --Check TSH 05/10/2024 TSH elevated at 11.247. add-on FT4 to today's labs. Continue synthroid  112 mcg for now. 12/14 - free T4 mildly elevated 1.20 (ref range 0.61--1.12).   --Given her reported increase A-fib symptoms, will reduce synthroid  slightly to 100 mcg  12/17: Recommend repeat thyroid studies with PCP in 6/-8 weeks  PAF (paroxysmal atrial fibrillation) (HCC) Follows  with Cardiology at [facility]. History of A-fib, with pauses, status post dual-chamber pacemaker placementon 04/22/24-sutures were removed yesterday (05/08/2024)  05/10/2024 currently in NSR on telemetry.   12/14 -- this afternoon, pt had some increased palpitations, onset after walking across her home, resolved after resting. HR was 99-110 but improved. HR overall has been controlled.  12/15 -- pt reports A-fib symptoms better this morning.   --PO Lopressor  12.5 mg BID PRN if HR sustaining > 110 bpm --EKG PRN if HR sustaining > 110  bpm --Continue home Eliquis  and flecainide  12/15 PM visit HR in 120-130's at rest with O2 90% on room air.  12/16 -- will start low dose scheduled metoprolol  12.5 mg BID (with BP hold parameters) --Message sent to patient's EP at [facility] Dr. Epifanio to update him at pt's request  She just had PPM placed last month at [facility]. She can f/u with her cardiologist at [facility] after discharge.  12/17: new Rx, metoprolol  tartrate 12.5 mg BID. Continue home flecanide 50 mg PO BID, Eliquis  5 mg PO BID.  GERD (gastroesophageal reflux disease) Home famotidine  20 mg BID continued  Diverticulitis-resolved as of 05/10/2024 Recent history of diverticulitis, denies any abdominal pain -S/p recent treatment with doxycycline    {Tip this will not be part of the note when signed Body mass index is 30.61 kg/m. , ,  (Optional):26781}  Pain control - [place]  Controlled Substance Reporting System database was reviewed. and patient was instructed, not to drive, operate heavy machinery, perform activities at heights, swimming or participation in water activities or provide baby-sitting services while on Pain, Sleep and Anxiety Medications; until their outpatient Physician has advised to do so again. Also recommended to not to take more than prescribed Pain, Sleep and Anxiety Medications.  Consultants: pt, ot Procedures performed: noe  Disposition: Home Diet recommendation:  Cardiac diet DISCHARGE MEDICATION: Allergies as of 05/14/2024       Reactions   Fentanyl  Shortness Of Breath   Asa [aspirin] Other (See Comments)   GI upset   Bextra [valdecoxib] Other (See Comments)   GI upset   Celebrex [celecoxib] Other (See Comments)   GI upset   Codeine Nausea And Vomiting   Motrin [ibuprofen] Other (See Comments)   GI upset   Naprosyn [naproxen] Other (See Comments)   GI upset   Sulfa Antibiotics Rash   hands blister        Medication List     STOP taking these medications     colchicine 0.6 MG tablet   cromolyn 5.2 MG/ACT nasal spray Commonly known as: NASALCROM   lidocaine  5 % Commonly known as: Lidoderm    pantoprazole  40 MG tablet Commonly known as: PROTONIX        TAKE these medications    acetaminophen  500 MG tablet Commonly known as: TYLENOL  Take 1,000 mg by mouth every 6 (six) hours as needed for mild pain (pain score 1-3). For pain   acidophilus Caps capsule Take 2 capsules by mouth daily for 14 days.   amoxicillin -clavulanate 875-125 MG tablet Commonly known as: AUGMENTIN  Take 1 tablet by mouth 2 (two) times daily for 5 days.   apixaban  5 MG Tabs tablet Commonly known as: ELIQUIS  Take 5 mg by mouth 2 (two) times daily.   benzonatate  100 MG capsule Commonly known as: TESSALON  Take 1 capsule (100 mg total) by mouth 3 (three) times daily as needed for cough.   clorazepate  7.5 MG tablet Commonly known as: TRANXENE  Take 7.5 mg by mouth at bedtime as  needed for sleep.   docusate sodium  100 MG capsule Commonly known as: COLACE Take 1 capsule (100 mg total) by mouth 2 (two) times daily as needed for mild constipation. What changed:  when to take this reasons to take this   famotidine  20 MG tablet Commonly known as: PEPCID  Take 1 tablet (20 mg total) by mouth 2 (two) times daily. What changed: when to take this   flecainide  50 MG tablet Commonly known as: TAMBOCOR  Take 50 mg by mouth 2 (two) times daily.   guaiFENesin -dextromethorphan 100-10 MG/5ML syrup Commonly known as: ROBITUSSIN DM Take 5 mLs by mouth 3 (three) times daily as needed for cough.   levalbuterol  45 MCG/ACT inhaler Commonly known as: XOPENEX  HFA Inhale 2 puffs into the lungs every 6 (six) hours as needed for wheezing or shortness of breath.   levothyroxine  100 MCG tablet Commonly known as: SYNTHROID  Take 1 tablet (100 mcg total) by mouth daily at 6 (six) AM. What changed:  medication strength how much to take when to take this   magnesium  oxide 400  MG tablet Commonly known as: MAG-OX Take 400 mg by mouth daily.   metoprolol  tartrate 25 MG tablet Commonly known as: LOPRESSOR  Take 0.5 tablets (12.5 mg total) by mouth 2 (two) times daily.   Multi-Vitamin tablet Take 1 tablet by mouth daily.   ondansetron  8 MG disintegrating tablet Commonly known as: ZOFRAN -ODT Take 1 tablet (8 mg total) by mouth every 8 (eight) hours as needed for nausea or vomiting.   VITAMIN B-12 PO Take 1 tablet by mouth daily.   Vitamin D3 125 MCG (5000 UT) Tabs Take 5,000 Units by mouth daily.   Xalatan  0.005 % ophthalmic solution Generic drug: latanoprost  Place 1 drop into both eyes at bedtime.       ASK your doctor about these medications    clorazepate  7.5 MG tablet Commonly known as: Tranxene -T Take 1 tablet (7.5 mg total) by mouth at bedtime as needed for up to 2 days for sleep. Ask about: Should I take this medication?               Durable Medical Equipment  (From admission, onward)           Start     Ordered   05/12/24 1703  For home use only DME oxygen  Once       Question Answer Comment  Length of Need 6 Months   Mode or (Route) Nasal cannula   Liters per Minute 2   Frequency Continuous (stationary and portable oxygen unit needed)   Oxygen delivery system: Gas      05/12/24 1702            Follow-up Information     [facility]) Follow up.   Specialty: Home Health Services Why: resume PT OT Contact information: [address] [place]  [phone]        [employer] (DME) Follow up.   Specialty: DME Services Why: [employer] has provided your home oxygen. Please call [employer] with any questions or concerns. Contact information: [address] [employer] [place]  [address] [phone]               Discharge Exam: Fredricka Weights   05/11/24 1000 05/11/24 1645 05/13/24 1058  Weight: 78.1 kg 78.5 kg  78.4 kg   Physical exam was done with the assistance of Tasha Troxler, medic, who was present in the home.  Vitals: T 98.4, RR 20, HR 70, BP 112/70, spO2 of 97% on RA.  Physical Exam Constitutional:      Appearance: Normal appearance. She is normal weight.  HENT:     Head: Normocephalic and atraumatic.     Right Ear: External ear normal.     Left Ear: External ear normal.     Nose: Nose normal.  Cardiovascular:     Rate and Rhythm: Normal rate.     Heart sounds: Normal heart sounds.  Pulmonary:     Effort: Pulmonary effort is normal. No respiratory distress.  Abdominal:     General: Bowel sounds are normal.     Palpations: Abdomen is soft.  Musculoskeletal:        General: No swelling. Normal range of motion.     Cervical back: Normal range of motion.  Skin:    General: Skin is warm and dry.     Capillary Refill: Capillary refill takes less than 2 seconds.  Neurological:     General: No focal deficit present.     Mental Status: She is alert and oriented to person, place, and time. Mental status is at baseline.  Psychiatric:        Mood and Affect: Mood normal.        Behavior: Behavior normal.   Condition at discharge: good  The results of significant diagnostics from this hospitalization (including imaging, microbiology, ancillary and laboratory) are listed below for reference.   Imaging Studies: CT CHEST WO CONTRAST Result Date: 05/10/2024 CLINICAL DATA:  Shortness of breath and cough. EXAM: CT CHEST WITHOUT CONTRAST TECHNIQUE: Multidetector CT imaging of the chest was performed following the standard protocol without IV contrast. RADIATION DOSE REDUCTION: This exam was performed according to the departmental dose-optimization program which includes automated exposure control, adjustment of the mA and/or kV according to patient size and/or use of iterative reconstruction technique. COMPARISON:  12/07/2023 FINDINGS: Cardiovascular: The heart is enlarged. Small pericardial  effusion is new in the interval. Coronary artery calcification is evident. Mild atherosclerotic calcification is noted in the wall of the thoracic aorta. Left-sided permanent pacemaker noted. Mediastinum/Nodes: No mediastinal lymphadenopathy. No evidence for gross hilar lymphadenopathy although assessment is limited by the lack of intravenous contrast on the current study. The esophagus has normal imaging features. There is no axillary lymphadenopathy. Lungs/Pleura: 12 mm irregular opacity in the right lower lobe (64/4) may reflect atelectasis or scarring. There is left greater than right lower lobe collapse/consolidation with small left and tiny right pleural effusions. 2 mm left upper lobe pulmonary nodule is stable in the interval, likely benign. Upper Abdomen: Visualized portion of the upper abdomen shows no acute findings. Musculoskeletal: No worrisome lytic or sclerotic osseous abnormality. IMPRESSION: 1. Left greater than right lower lobe collapse/consolidation with small left and tiny right pleural effusions. 2. 12 mm irregular opacity in the right lower lobe may reflect atelectasis or scarring. Follow-up CT chest in 3 months recommended to ensure resolution. 3. Small pericardial effusion, new in the interval. 4.  Aortic Atherosclerosis (ICD10-I70.0). Electronically Signed   By: Camellia Candle M.D.   On: 05/10/2024 11:32   DG Chest 2 View Result Date: 05/08/2024 CLINICAL DATA:  Dry cough and congestion for 1 week, recent pacemaker placement EXAM: CHEST - 2 VIEW COMPARISON:  02/09/2024 FINDINGS: Frontal and lateral views of the chest demonstrate dual lead pacer within the left anterior chest, proximal lead in the region of the right atrium and distal lead in the region of the right ventricle.  Mild enlargement of the cardiac silhouette. Pulmonary vascular congestion without overt edema. There is left lower lobe consolidation along the posterior costophrenic angle, with small left pleural effusion noted.  Trace right pleural effusion. No pneumothorax. No acute bony abnormalities. IMPRESSION: 1. Left lower lobe consolidation, which may reflect atelectasis or pneumonia. 2. Small bilateral pleural effusions, left greater than right. 3. Enlarged cardiac silhouette and pulmonary vascular congestion, without overt edema. 4. Dual lead pacemaker as above. Electronically Signed   By: Ozell Daring M.D.   On: 05/08/2024 18:01    Microbiology: Results for orders placed or performed during the hospital encounter of 05/08/24  Resp panel by RT-PCR (RSV, Flu A&B, Covid) Anterior Nasal Swab     Status: None   Collection Time: 05/08/24  4:54 PM   Specimen: Anterior Nasal Swab  Result Value Ref Range Status   SARS Coronavirus 2 by RT PCR NEGATIVE NEGATIVE Final    Comment: (NOTE) SARS-CoV-2 target nucleic acids are NOT DETECTED.  The SARS-CoV-2 RNA is generally detectable in upper respiratory specimens during the acute phase of infection. The lowest concentration of SARS-CoV-2 viral copies this assay can detect is 138 copies/mL. A negative result does not preclude SARS-Cov-2 infection and should not be used as the sole basis for treatment or other patient management decisions. A negative result may occur with  improper specimen collection/handling, submission of specimen other than nasopharyngeal swab, presence of viral mutation(s) within the areas targeted by this assay, and inadequate number of viral copies(<138 copies/mL). A negative result must be combined with clinical observations, patient history, and epidemiological information. The expected result is Negative.  Fact Sheet for Patients:  bloggercourse.com  Fact Sheet for Healthcare Providers:  seriousbroker.it  This test is no t yet approved or cleared by the [place]  FDA and  has been authorized for detection and/or diagnosis of SARS-CoV-2 by FDA under an Emergency Use Authorization  (EUA). This EUA will remain  in effect (meaning this test can be used) for the duration of the COVID-19 declaration under Section 564(b)(1) of the Act, 21 U.S.C.section 360bbb-3(b)(1), unless the authorization is terminated  or revoked sooner.       Influenza A by PCR NEGATIVE NEGATIVE Final   Influenza B by PCR NEGATIVE NEGATIVE Final    Comment: (NOTE) The Xpert Xpress SARS-CoV-2/FLU/RSV plus assay is intended as an aid in the diagnosis of influenza from Nasopharyngeal swab specimens and should not be used as a sole basis for treatment. Nasal washings and aspirates are unacceptable for Xpert Xpress SARS-CoV-2/FLU/RSV testing.  Fact Sheet for Patients: bloggercourse.com  Fact Sheet for Healthcare Providers: seriousbroker.it  This test is not yet approved or cleared by the [place]  FDA and has been authorized for detection and/or diagnosis of SARS-CoV-2 by FDA under an Emergency Use Authorization (EUA). This EUA will remain in effect (meaning this test can be used) for the duration of the COVID-19 declaration under Section 564(b)(1) of the Act, 21 U.S.C. section 360bbb-3(b)(1), unless the authorization is terminated or revoked.     Resp Syncytial Virus by PCR NEGATIVE NEGATIVE Final    Comment: (NOTE) Fact Sheet for Patients: bloggercourse.com  Fact Sheet for Healthcare Providers: seriousbroker.it  This test is not yet approved or cleared by the [place]  FDA and has been authorized for detection and/or diagnosis of SARS-CoV-2 by FDA under an Emergency Use Authorization (EUA). This EUA will remain in effect (meaning this test can be used) for the duration of the COVID-19 declaration under Section 564(b)(1) of  the Act, 21 U.S.C. section 360bbb-3(b)(1), unless the authorization is terminated or revoked.  Performed at [facility], [address]  [place], [place] [address]    Labs: CBC: Recent Labs  Lab 05/08/24 1800 05/10/24 0235 05/11/24 1222 05/12/24 1109  WBC 8.7 8.5 10.2 7.2  NEUTROABS 5.5  --   --   --   HGB 9.4* 9.0* 10.0* 9.7*  HCT 29.1* 26.9* 31.6* 29.6*  MCV 91.8 92.1 93.2 93.1  PLT 345 315 362 296   Basic Metabolic Panel: Recent Labs  Lab 05/08/24 1800 05/09/24 1007 05/10/24 0235 05/11/24 1222 05/12/24 1109 05/13/24 1117  NA 139  --  137 135 136 140  K 3.3*  --  3.5 3.9 3.9 4.1  CL 99  --  94* 94* 97* 100  CO2 29  --  37* 33* 28 30  GLUCOSE 101*  --  107* 129* 125* 147*  BUN 9  --  18 22 16 13   CREATININE 0.82 1.06* 1.16* 1.26* 1.18* 1.04*  CALCIUM  8.5*  --  8.1* 8.5* 8.5* 8.8*  MG  --  1.6*  --  2.0  --  1.8  PHOS  --  3.4  --   --   --   --    Discharge time spent: greater than 30 minutes.  Signed: Dr. Sherre [facility] Location: [place]  05/14/2024

## 2024-05-14 NOTE — Progress Notes (Signed)
 Son Vanessa Fernandez and Ms Landgren called to administer synthroid . Her armband is on correctly.

## 2024-05-14 NOTE — Progress Notes (Signed)
 Telephone call with Vanessa Fernandez. [employer] data is lost. She answered the phone in no distress. She is not [facility] and has no complaints. She will call from the tablet in approximately 30 minutes to take synthroid  and troubleshoot armband.

## 2024-05-14 NOTE — Progress Notes (Signed)
 Virtual visit with Vanessa Fernandez and Victoria, paramedic. Evaluating oxygen needs d/t poor readings on wearable. She is in no distress. She has been comfortably weaned to 2L nasal cannula.

## 2024-05-14 NOTE — Progress Notes (Signed)
 Arrived at pt's home for assessment and med administration. Pt is CAOX4 and states she is feeling a lot better. Pt vitals were obtained and all appeared to be within normal limits, IV was assessed and appeared clean and intact. Pt was administered rocephin  as ordered via IV and all other meds were administered PO. Pt son is with pt and he states he can see a big difference in mother at this time.

## 2024-05-14 NOTE — TOC CM/SW Note (Signed)
 [employer] set up home oxygen 05/13/2024 at 13:00.

## 2024-05-14 NOTE — Discharge Summary (Signed)
 Physician Discharge Summary   [name] MRN: [id number] DOB: [birth year]  Admit date:     05/08/2024  Discharge date: 05/14/2024  Discharge Physician: Dr. Sherre   Telemedicine encounter: Patient confirmed her first and last name, her date of birth.  PCP: Lelon Norleen ORN., MD   Recommendations at discharge:   New Rx: Metoprolol  to tartrate 12.5 mg p.o. twice daily prescribed on discharge.  For further refills please contact either your primary care provider or cardiology outpatient. I recommend you follow-up either cardiology within 4 weeks of discharge given that you had a new cardiac medication prescribed.  Please go to your cardiology appointment on July 23, 2024, if you are not able to get seen earlier. I recommend you follow-up with your primary care doctor within 4 weeks of discharge.  Discharge Diagnoses: Principal Problem:   LLL pneumonia Active Problems:   Acute diastolic CHF (congestive heart failure) (HCC)   SOB (shortness of breath)   Acute respiratory failure with hypoxia (HCC)   Hypokalemia   Hypomagnesemia   GERD (gastroesophageal reflux disease)   PAF (paroxysmal atrial fibrillation) (HCC)   Acquired hypothyroidism  Resolved Problems:   * No resolved hospital problems. [facility] Course: Vanessa Fernandez is a 89 year old female with A-fib with sinus pause/bradycardia on Eliquis  and flecainide  with recent pacemaker placement on 04/22/2024 at [facility], hypothyroidism.  12/11: presented to [facility] ED for shortness of breath, cough x 2 weeks.. Recently completed 2 weeks of antibiotics, doxycycline. Denied of having any fever or chills.   Admission Labs: Covid,rsv, flu negative WBC 8.7, HgB 9.4, plt 345 Pro-BNP 1418 Na 139, K 3.3, CO2 of 29, BUN 9, Scr 0.82, glu 101 Mg 1.6 TSH 11.247 Procalcitonin <0.10  12/12: Pt admitted to hospitalist service for possible community-acquired pneumonia and acute heart failure exacerbation.  Patient was  initiated on empiric antibiotic and gentle diuresis with IV furosemide .  12/13: Patient admitted to hospital at home program.  CT chest showed left lower lobe pneumonia ceftriaxone  and p.o. azithromycin . Levaquin  was discontinued.  Patient   Significant Events: Admitted 05/08/2024 for SOB, acute diastolic CHF 05-10-2024 transferred to [facility] program 12/14-15 -intermittently hypoxic, continues to require O2. A-fib with RVR intermittently. 12/16 -low-dose metoprolol  tartrate 12.5 mg BID added  12/16: I assumed care of the patient.  Overnight no reports.  Patient heart rate maintained as appropriate on new metoprolol  tartrate 12.5 mg p.o. twice daily. Plan for discharge today with augmentin  875/125 BID to complete 10 day course.  Assessment and Plan:  * LLL pneumonia 05/10/2024  CT chest shows LLL pneumonia. Pt has been on po doxycycline prior to admission but she still has a cough.   --Start IV Rocephin /po zithromax .  --Send Strep pneumo and legionella antigen.  --Will give her first dose of IV rocephin  in the hospital before she transfers to [facility]. 12/14 -- continue IV Rocephin  and PO Zithromax .  Weaned to room air but had O2 desats overnight, having some brief dips in sats today on room air but improve above 90 per [facility] monitoring 12/15 - continue IV Rocephin  & PO Zithromax  - day 4 antibiotics 12/16 - day 5 antibiotics, completing PO Zithromax .  IV Rocephin  today and transition to PO tomorrow to complete 7-10 day course  12/17: patient completed 5 day course of abx. No antibiotics will be provided on discharge.  Acute respiratory failure with hypoxia (HCC) 05/10/2024 RA sats 83% yesterday, placed on supplemental O2.  Pt not on home O2. Attributed  to acute CHF, later found to have PNA as well.  12/14 - pt having O2 desats to mid-80's overnight. On room air this AM visit with sat 94% on room air 2 hours after removing O2 earlier 12/15 - on RA this AM.  Wore O2 overnight without  hypoxia alarms on CH reported. 12/16 - on RA this AM with sats 92-93% at rest. Wore O2 overnight, A-fib HR's more stable --Mgmt of PNA and CHF as outlined --Pt to wear O2 when sleeping overnight & agreeable --Supplement O2 PRN to keep sats > 90%. --Continue to monitor.  --Pt qualified for home O2, TOC following  Pt shared she did smoke when she was younger. Started at age 16. Smoked about 30 years. Quit in the 1960s.  12/17: pt is on room air, no respiratory difficulty/distress while speaking.  SOB (shortness of breath) 05/09/2024 - Shortness of breath, dry cough, tachypnea -Elevated BNP <1400 chest x-ray consistent with some congestion, possible in lower lobes -Cannot rule out viral pneumonia, patient remained afebrile, mildly hypoxic with no leukocytosis -Influenza A/B SARS-CoV-2-all negative -Due to comorbidities, initiating empiric antibiotics PO Levaquin  for few days  Will Obtaining procalcitonin level, will de-escalate antibiotics accordingly - Continuing gentle diuresis with IV Lasix   05/10/2024 procalcitonin negative. Will get CT chest without contrast just to make sure she does not have a occult pneumonia but I doubt it. Stop levaquin  for now. UPDATE - CT chest shows LLL pneumonia. Start IV rocephin /po zithromax .  12/14 -- pt clinically improving, but continues to have O2 desats into 80's during sleep and intermittently througout day 12/15 -- pt notes ongoing improvement in breathing, less cough this AM 12/16 - improving  --PNA mgmt as outlined --Off diuresis --Monitor volume status, off diuresis --Duonebs, antitussives & other supportive care per orders  12/17: resolved  Acute diastolic CHF (congestive heart failure) (HCC) 05/09/2024 - It appears she may have HFpEF.   - Elevated proBNP 1418.0, with some congestions present on imaging -With underlying history of A-fib, falls, status post pacemaker placement -Continuing IV diuretics Lasix  -Monitoring I's and O's, daily  weight  Echo from [facility] 12-20- 25,; reviewed EF 60-65%, mild LVH, mild MR, mild TR, aortic valve calcification, trivial pericardial effusion  05/10/2024 urine output about 1.5 liters yesterday. Pt feeling better. Pt had Pro-BNP on admission that was elevated and BNP today that was normal. Will check bnp tomorrow just so we can compare same lab tests. Given her rise of serum CO2, Bun and Scr, will change IV lasix  to 20 mg daily(next dose tomorrow). She did get dose of IV lasix  20 mg daily.  05/11/24 -- Cr up a bit more today 1.26. --Stop further diuresis for now. DC Lasix .  12/15>>16/25 - Off diuresis & renal function improving 12/17: reviewed sCr/eGFR from 12/16, showing 1.04/49, respectively. This may be patient's new baseline. Follow-up with PCP for ongoing monitor.  Hypomagnesemia 05/10/2024 Mg not repleted yesterday. Given hx of PAF. Will give 2 grams IV Mg today. Repeat Mg in AM. 12/14 - Mg 2.0 12/15 - No Mg level needed today 12/16 - mg 1.8  Hypokalemia 05/09/2024 give po kcl 40 meq 05/10/2024 K up to 3.5, will give another 40 meq kcl po x 1 today 12/14 - K 3.9 today, no Lasix  being given. 12/15 - K 3.9 12/16 - K 4.1 --Repeat BMP tomorrow --Replace K as needed  12/17: reviewd labs from 12/16, hypokalemia, resolved.  Acquired hypothyroidism 05/09/2024 Continue Synthroid  100 mcg dialy --Check TSH 05/10/2024 TSH elevated  at 11.247. add-on FT4 to today's labs. Continue synthroid  112 mcg for now. 12/14 - free T4 mildly elevated 1.20 (ref range 0.61--1.12).   --Given her reported increase A-fib symptoms, will reduce synthroid  slightly to 100 mcg  12/17: Recommend repeat thyroid studies with PCP in 6/-8 weeks  PAF (paroxysmal atrial fibrillation) (HCC) Follows with Cardiology at [facility]. History of A-fib, with pauses, status post dual-chamber pacemaker placementon 04/22/24-sutures were removed yesterday (05/08/2024)  05/10/2024 currently in NSR on telemetry.    12/14 -- this afternoon, pt had some increased palpitations, onset after walking across her home, resolved after resting. HR was 99-110 but improved. HR overall has been controlled.  12/15 -- pt reports A-fib symptoms better this morning.   --PO Lopressor  12.5 mg BID PRN if HR sustaining > 110 bpm --EKG PRN if HR sustaining > 110 bpm --Continue home Eliquis  and flecainide  12/15 PM visit HR in 120-130's at rest with O2 90% on room air.  12/16 -- will start low dose scheduled metoprolol  12.5 mg BID (with BP hold parameters) --Message sent to patient's EP at [facility] Dr. Epifanio to update him at pt's request  She just had PPM placed last month at [facility]. She can f/u with her cardiologist at [facility] after discharge.  12/17: new Rx, metoprolol  tartrate 12.5 mg BID. Continue home flecanide 50 mg PO BID, Eliquis  5 mg PO BID.  GERD (gastroesophageal reflux disease) Home famotidine  20 mg BID continued  Diverticulitis-resolved as of 05/10/2024 Recent history of diverticulitis, denies any abdominal pain -S/p recent treatment with doxycycline      Pain control - [place]  Controlled Substance Reporting System database was reviewed. and patient was instructed, not to drive, operate heavy machinery, perform activities at heights, swimming or participation in water activities or provide baby-sitting services while on Pain, Sleep and Anxiety Medications; until their outpatient Physician has advised to do so again. Also recommended to not to take more than prescribed Pain, Sleep and Anxiety Medications.  Consultants: PT, OT Procedures performed: None Disposition: Home Diet recommendation:  Cardiac diet DISCHARGE MEDICATION: Allergies as of 05/14/2024       Reactions   Fentanyl  Shortness Of Breath   Asa [aspirin] Other (See Comments)   GI upset   Bextra [valdecoxib] Other (See Comments)   GI upset   Celebrex [celecoxib] Other (See Comments)   GI upset   Codeine Nausea  And Vomiting   Motrin [ibuprofen] Other (See Comments)   GI upset   Naprosyn [naproxen] Other (See Comments)   GI upset   Sulfa Antibiotics Rash   hands blister        Medication List     STOP taking these medications    colchicine 0.6 MG tablet   cromolyn 5.2 MG/ACT nasal spray Commonly known as: NASALCROM   lidocaine  5 % Commonly known as: Lidoderm    pantoprazole  40 MG tablet Commonly known as: PROTONIX        TAKE these medications    acetaminophen  500 MG tablet Commonly known as: TYLENOL  Take 1,000 mg by mouth every 6 (six) hours as needed for mild pain (pain score 1-3). For pain   acidophilus Caps capsule Take 2 capsules by mouth daily for 14 days.   amoxicillin -clavulanate 875-125 MG tablet Commonly known as: AUGMENTIN  Take 1 tablet by mouth 2 (two) times daily for 5 days.   apixaban  5 MG Tabs tablet Commonly known as: ELIQUIS  Take 5 mg by mouth 2 (two) times daily.   benzonatate  100 MG capsule Commonly known as:  TESSALON  Take 1 capsule (100 mg total) by mouth 3 (three) times daily as needed for cough.   clorazepate  7.5 MG tablet Commonly known as: TRANXENE  Take 7.5 mg by mouth at bedtime as needed for sleep.   docusate sodium  100 MG capsule Commonly known as: COLACE Take 1 capsule (100 mg total) by mouth 2 (two) times daily as needed for mild constipation. What changed:  when to take this reasons to take this   famotidine  20 MG tablet Commonly known as: PEPCID  Take 1 tablet (20 mg total) by mouth 2 (two) times daily. What changed: when to take this   flecainide  50 MG tablet Commonly known as: TAMBOCOR  Take 50 mg by mouth 2 (two) times daily.   guaiFENesin -dextromethorphan 100-10 MG/5ML syrup Commonly known as: ROBITUSSIN DM Take 5 mLs by mouth 3 (three) times daily as needed for cough.   levalbuterol  45 MCG/ACT inhaler Commonly known as: XOPENEX  HFA Inhale 2 puffs into the lungs every 6 (six) hours as needed for wheezing or  shortness of breath.   levothyroxine  100 MCG tablet Commonly known as: SYNTHROID  Take 1 tablet (100 mcg total) by mouth daily at 6 (six) AM. What changed:  medication strength how much to take when to take this   magnesium  oxide 400 MG tablet Commonly known as: MAG-OX Take 400 mg by mouth daily.   metoprolol  tartrate 25 MG tablet Commonly known as: LOPRESSOR  Take 0.5 tablets (12.5 mg total) by mouth 2 (two) times daily.   Multi-Vitamin tablet Take 1 tablet by mouth daily.   ondansetron  8 MG disintegrating tablet Commonly known as: ZOFRAN -ODT Take 1 tablet (8 mg total) by mouth every 8 (eight) hours as needed for nausea or vomiting.   VITAMIN B-12 PO Take 1 tablet by mouth daily.   Vitamin D3 125 MCG (5000 UT) Tabs Take 5,000 Units by mouth daily.   Xalatan  0.005 % ophthalmic solution Generic drug: latanoprost  Place 1 drop into both eyes at bedtime.       ASK your doctor about these medications    clorazepate  7.5 MG tablet Commonly known as: Tranxene -T Take 1 tablet (7.5 mg total) by mouth at bedtime as needed for up to 2 days for sleep. Ask about: Should I take this medication?               Durable Medical Equipment  (From admission, onward)           Start     Ordered   05/12/24 1703  For home use only DME oxygen  Once       Question Answer Comment  Length of Need 6 Months   Mode or (Route) Nasal cannula   Liters per Minute 2   Frequency Continuous (stationary and portable oxygen unit needed)   Oxygen delivery system: Gas      05/12/24 1702            Follow-up Information     [facility]) Follow up.   Specialty: Home Health Services Why: resume PT OT Contact information: [address] [place]  [address] [phone]        [employer] (DME) Follow up.   Specialty: DME Services Why: [employer] has provided your home oxygen. Please call [employer] with any questions  or concerns. Contact information: [address] [place]   [address] [phone]               Discharge Exam: Fredricka Weights  05/11/24 1000 05/11/24 1645 05/13/24 1058  Weight: 78.1 kg 78.5 kg 78.4 kg   Physical exam portion was completed with the assistance of [employer], medic who was present in the home with the patient during the virtual encounter.  Vitals showed temperature 98.4, respiration rate 20, heart rate 70, blood pressure 112/72, SpO2 97% on room air.  Physical Exam Constitutional:      Appearance: Normal appearance. She is normal weight.  HENT:     Head: Normocephalic and atraumatic.     Right Ear: External ear normal.     Left Ear: External ear normal.  Eyes:     Extraocular Movements: Extraocular movements intact.     Conjunctiva/sclera: Conjunctivae normal.  Neck:     Comments: And midline Cardiovascular:     Rate and Rhythm: Normal rate and regular rhythm.     Pulses: Normal pulses.     Heart sounds: Normal heart sounds. No murmur heard. Pulmonary:     Effort: Pulmonary effort is normal. No respiratory distress.     Breath sounds: Normal breath sounds.  Abdominal:     Palpations: Abdomen is soft.     Tenderness: There is no abdominal tenderness.  Musculoskeletal:        General: Normal range of motion.     Cervical back: Normal range of motion.  Skin:    General: Skin is warm and dry.     Capillary Refill: Capillary refill takes less than 2 seconds.  Neurological:     General: No focal deficit present.     Mental Status: She is alert and oriented to person, place, and time. Mental status is at baseline.  Psychiatric:        Mood and Affect: Mood normal.        Behavior: Behavior normal.   Condition at discharge: good  The results of significant diagnostics from this hospitalization (including imaging, microbiology, ancillary and laboratory) are listed below for reference.   Imaging Studies: CT CHEST  WO CONTRAST Result Date: 05/10/2024 CLINICAL DATA:  Shortness of breath and cough. EXAM: CT CHEST WITHOUT CONTRAST TECHNIQUE: Multidetector CT imaging of the chest was performed following the standard protocol without IV contrast. RADIATION DOSE REDUCTION: This exam was performed according to the departmental dose-optimization program which includes automated exposure control, adjustment of the mA and/or kV according to patient size and/or use of iterative reconstruction technique. COMPARISON:  12/07/2023 FINDINGS: Cardiovascular: The heart is enlarged. Small pericardial effusion is new in the interval. Coronary artery calcification is evident. Mild atherosclerotic calcification is noted in the wall of the thoracic aorta. Left-sided permanent pacemaker noted. Mediastinum/Nodes: No mediastinal lymphadenopathy. No evidence for gross hilar lymphadenopathy although assessment is limited by the lack of intravenous contrast on the current study. The esophagus has normal imaging features. There is no axillary lymphadenopathy. Lungs/Pleura: 12 mm irregular opacity in the right lower lobe (64/4) may reflect atelectasis or scarring. There is left greater than right lower lobe collapse/consolidation with small left and tiny right pleural effusions. 2 mm left upper lobe pulmonary nodule is stable in the interval, likely benign. Upper Abdomen: Visualized portion of the upper abdomen shows no acute findings. Musculoskeletal: No worrisome lytic or sclerotic osseous abnormality. IMPRESSION: 1. Left greater than right lower lobe collapse/consolidation with small left and tiny right pleural effusions. 2. 12 mm irregular opacity in the right lower lobe may reflect atelectasis or scarring. Follow-up CT chest in 3 months recommended to ensure resolution. 3. Small pericardial effusion, new in the interval.  4.  Aortic Atherosclerosis (ICD10-I70.0). Electronically Signed   By: Camellia Candle M.D.   On: 05/10/2024 11:32   DG Chest 2  View Result Date: 05/08/2024 CLINICAL DATA:  Dry cough and congestion for 1 week, recent pacemaker placement EXAM: CHEST - 2 VIEW COMPARISON:  02/09/2024 FINDINGS: Frontal and lateral views of the chest demonstrate dual lead pacer within the left anterior chest, proximal lead in the region of the right atrium and distal lead in the region of the right ventricle. Mild enlargement of the cardiac silhouette. Pulmonary vascular congestion without overt edema. There is left lower lobe consolidation along the posterior costophrenic angle, with small left pleural effusion noted. Trace right pleural effusion. No pneumothorax. No acute bony abnormalities. IMPRESSION: 1. Left lower lobe consolidation, which may reflect atelectasis or pneumonia. 2. Small bilateral pleural effusions, left greater than right. 3. Enlarged cardiac silhouette and pulmonary vascular congestion, without overt edema. 4. Dual lead pacemaker as above. Electronically Signed   By: Ozell Daring M.D.   On: 05/08/2024 18:01   Microbiology: Results for orders placed or performed during the hospital encounter of 05/08/24  Resp panel by RT-PCR (RSV, Flu A&B, Covid) Anterior Nasal Swab     Status: None   Collection Time: 05/08/24  4:54 PM   Specimen: Anterior Nasal Swab  Result Value Ref Range Status   SARS Coronavirus 2 by RT PCR NEGATIVE NEGATIVE Final    Comment: (NOTE) SARS-CoV-2 target nucleic acids are NOT DETECTED.  The SARS-CoV-2 RNA is generally detectable in upper respiratory specimens during the acute phase of infection. The lowest concentration of SARS-CoV-2 viral copies this assay can detect is 138 copies/mL. A negative result does not preclude SARS-Cov-2 infection and should not be used as the sole basis for treatment or other patient management decisions. A negative result may occur with  improper specimen collection/handling, submission of specimen other than nasopharyngeal swab, presence of viral mutation(s) within  the areas targeted by this assay, and inadequate number of viral copies(<138 copies/mL). A negative result must be combined with clinical observations, patient history, and epidemiological information. The expected result is Negative.  Fact Sheet for Patients:  bloggercourse.com  Fact Sheet for Healthcare Providers:  seriousbroker.it  This test is no t yet approved or cleared by the [place]  FDA and  has been authorized for detection and/or diagnosis of SARS-CoV-2 by FDA under an Emergency Use Authorization (EUA). This EUA will remain  in effect (meaning this test can be used) for the duration of the COVID-19 declaration under Section 564(b)(1) of the Act, 21 U.S.C.section 360bbb-3(b)(1), unless the authorization is terminated  or revoked sooner.       Influenza A by PCR NEGATIVE NEGATIVE Final   Influenza B by PCR NEGATIVE NEGATIVE Final    Comment: (NOTE) The Xpert Xpress SARS-CoV-2/FLU/RSV plus assay is intended as an aid in the diagnosis of influenza from Nasopharyngeal swab specimens and should not be used as a sole basis for treatment. Nasal washings and aspirates are unacceptable for Xpert Xpress SARS-CoV-2/FLU/RSV testing.  Fact Sheet for Patients: bloggercourse.com  Fact Sheet for Healthcare Providers: seriousbroker.it  This test is not yet approved or cleared by the [place]  FDA and has been authorized for detection and/or diagnosis of SARS-CoV-2 by FDA under an Emergency Use Authorization (EUA). This EUA will remain in effect (meaning this test can be used) for the duration of the COVID-19 declaration under Section 564(b)(1) of the Act, 21 U.S.C. section 360bbb-3(b)(1), unless the authorization is terminated or revoked.  Resp Syncytial Virus by PCR NEGATIVE NEGATIVE Final    Comment: (NOTE) Fact Sheet for  Patients: bloggercourse.com  Fact Sheet for Healthcare Providers: seriousbroker.it  This test is not yet approved or cleared by the [place]  FDA and has been authorized for detection and/or diagnosis of SARS-CoV-2 by FDA under an Emergency Use Authorization (EUA). This EUA will remain in effect (meaning this test can be used) for the duration of the COVID-19 declaration under Section 564(b)(1) of the Act, 21 U.S.C. section 360bbb-3(b)(1), unless the authorization is terminated or revoked.  Performed at [facility], [address] [place], [place] [address]    Labs: CBC: Recent Labs  Lab 05/08/24 1800 05/10/24 0235 05/11/24 1222 05/12/24 1109  WBC 8.7 8.5 10.2 7.2  NEUTROABS 5.5  --   --   --   HGB 9.4* 9.0* 10.0* 9.7*  HCT 29.1* 26.9* 31.6* 29.6*  MCV 91.8 92.1 93.2 93.1  PLT 345 315 362 296   Basic Metabolic Panel: Recent Labs  Lab 05/08/24 1800 05/09/24 1007 05/10/24 0235 05/11/24 1222 05/12/24 1109 05/13/24 1117  NA 139  --  137 135 136 140  K 3.3*  --  3.5 3.9 3.9 4.1  CL 99  --  94* 94* 97* 100  CO2 29  --  37* 33* 28 30  GLUCOSE 101*  --  107* 129* 125* 147*  BUN 9  --  18 22 16 13   CREATININE 0.82 1.06* 1.16* 1.26* 1.18* 1.04*  CALCIUM  8.5*  --  8.1* 8.5* 8.5* 8.8*  MG  --  1.6*  --  2.0  --  1.8  PHOS  --  3.4  --   --   --   --    Discharge time spent: greater than 30 minutes.  Signed: Dr. Sherre [facility] Location: [place]  05/14/2024
